# Patient Record
Sex: Male | Born: 1960 | Race: Black or African American | Hispanic: No | Marital: Married | Smoking: Former smoker
Health system: Southern US, Community
[De-identification: ages and names within clinical notes are randomized; demographics above are authoritative.]

## PROBLEM LIST (undated history)

## (undated) ENCOUNTER — Ambulatory Visit: Admission: EM

## (undated) DIAGNOSIS — Z5189 Encounter for other specified aftercare: Secondary | ICD-10-CM

## (undated) DIAGNOSIS — I10 Essential (primary) hypertension: Secondary | ICD-10-CM

## (undated) DIAGNOSIS — Z720 Tobacco use: Secondary | ICD-10-CM

## (undated) DIAGNOSIS — E785 Hyperlipidemia, unspecified: Secondary | ICD-10-CM

## (undated) DIAGNOSIS — I219 Acute myocardial infarction, unspecified: Secondary | ICD-10-CM

## (undated) DIAGNOSIS — R578 Other shock: Secondary | ICD-10-CM

## (undated) DIAGNOSIS — K279 Peptic ulcer, site unspecified, unspecified as acute or chronic, without hemorrhage or perforation: Secondary | ICD-10-CM

## (undated) HISTORY — DX: Hyperlipidemia, unspecified: E78.5

## (undated) HISTORY — DX: Tobacco use: Z72.0

## (undated) HISTORY — PX: UPPER GI ENDOSCOPY: SHX6162

## (undated) HISTORY — DX: Other shock: R57.8

---

## 2002-09-03 ENCOUNTER — Inpatient Hospital Stay (HOSPITAL_COMMUNITY): Admission: EM | Admit: 2002-09-03 | Discharge: 2002-09-05 | Payer: Self-pay | Admitting: Emergency Medicine

## 2012-04-06 ENCOUNTER — Encounter (HOSPITAL_COMMUNITY): Payer: Self-pay | Admitting: Emergency Medicine

## 2012-04-06 ENCOUNTER — Emergency Department (HOSPITAL_COMMUNITY): Payer: Self-pay

## 2012-04-06 ENCOUNTER — Encounter (HOSPITAL_COMMUNITY): Payer: Self-pay | Admitting: *Deleted

## 2012-04-06 ENCOUNTER — Emergency Department (HOSPITAL_COMMUNITY)
Admission: EM | Admit: 2012-04-06 | Discharge: 2012-04-06 | Disposition: A | Discharge status: Home / Self Care | Payer: Self-pay | Attending: Emergency Medicine | Admitting: Emergency Medicine

## 2012-04-06 DIAGNOSIS — Z8711 Personal history of peptic ulcer disease: Secondary | ICD-10-CM | POA: Insufficient documentation

## 2012-04-06 DIAGNOSIS — Z79899 Other long term (current) drug therapy: Secondary | ICD-10-CM | POA: Insufficient documentation

## 2012-04-06 DIAGNOSIS — Y9389 Activity, other specified: Secondary | ICD-10-CM | POA: Insufficient documentation

## 2012-04-06 DIAGNOSIS — Y9289 Other specified places as the place of occurrence of the external cause: Secondary | ICD-10-CM | POA: Insufficient documentation

## 2012-04-06 DIAGNOSIS — R58 Hemorrhage, not elsewhere classified: Secondary | ICD-10-CM

## 2012-04-06 DIAGNOSIS — Z8719 Personal history of other diseases of the digestive system: Secondary | ICD-10-CM | POA: Insufficient documentation

## 2012-04-06 DIAGNOSIS — I10 Essential (primary) hypertension: Secondary | ICD-10-CM | POA: Insufficient documentation

## 2012-04-06 DIAGNOSIS — IMO0002 Reserved for concepts with insufficient information to code with codable children: Secondary | ICD-10-CM

## 2012-04-06 DIAGNOSIS — X58XXXA Exposure to other specified factors, initial encounter: Secondary | ICD-10-CM | POA: Insufficient documentation

## 2012-04-06 DIAGNOSIS — F17200 Nicotine dependence, unspecified, uncomplicated: Secondary | ICD-10-CM | POA: Insufficient documentation

## 2012-04-06 DIAGNOSIS — S61209A Unspecified open wound of unspecified finger without damage to nail, initial encounter: Secondary | ICD-10-CM | POA: Insufficient documentation

## 2012-04-06 DIAGNOSIS — Y838 Other surgical procedures as the cause of abnormal reaction of the patient, or of later complication, without mention of misadventure at the time of the procedure: Secondary | ICD-10-CM | POA: Insufficient documentation

## 2012-04-06 HISTORY — DX: Essential (primary) hypertension: I10

## 2012-04-06 HISTORY — DX: Peptic ulcer, site unspecified, unspecified as acute or chronic, without hemorrhage or perforation: K27.9

## 2012-04-06 MED ORDER — CEPHALEXIN 250 MG PO CAPS
500.0000 mg | ORAL_CAPSULE | Freq: Once | ORAL | Status: AC
  Administered 2012-04-06: 500 mg via ORAL
  Filled 2012-04-06: qty 1

## 2012-04-06 MED ORDER — CEPHALEXIN 500 MG PO CAPS: 500.0000 mg | ORAL_CAPSULE | Freq: Two times a day (BID) | ORAL | Status: DC

## 2012-04-06 NOTE — Progress Notes (Signed)
Orthopedic Tech Progress Note Patient Details:  Antonio Ramos [birth year] [id number]  Ortho Devices Type of Ortho Device: Finger splint Ortho Device/Splint Location: right hand Ortho Device/Splint Interventions: Application   Devinne Epstein 04/06/2012, 6:30 PM

## 2012-04-06 NOTE — ED Provider Notes (Signed)
History     CSN: [id number]  Arrival date & time 04/06/12  0016   First MD Initiated Contact with Patient 04/06/12 0249      Chief Complaint  Patient presents with  . Laceration    (Consider location/radiation/quality/duration/timing/severity/associated sxs/prior treatment) HPI  Antonio Ramos is a 52 y.o. male complaining of complaining of laceration to right first digit approximately 9 hours ago while patient was repairing a transmission. Pain is minimal, tetanus is updated. Patient denies any decreased range of motion or change in sensation.  Past Medical History  Diagnosis Date  . Hypertension   . PUD (peptic ulcer disease)     History reviewed. No pertinent past surgical history.  No family history on file.  History  Substance Use Topics  . Smoking status: Current Some Day Smoker  . Smokeless tobacco: Not on file  . Alcohol Use: Yes      Review of Systems  Constitutional: Negative for fever.  Respiratory: Negative for shortness of breath.   Cardiovascular: Negative for chest pain.  Gastrointestinal: Negative for nausea, vomiting, abdominal pain and diarrhea.  Skin: Positive for wound.  All other systems reviewed and are negative.    Allergies  Review of patient's allergies indicates not on file.  Home Medications  No current outpatient prescriptions on file.  BP 151/126  Pulse 99  Temp 98.1 F (36.7 C) (Oral)  Resp 18  SpO2 99%  Physical Exam  Nursing note and vitals reviewed. Constitutional: He is oriented to person, place, and time. He appears well-developed and well-nourished. No distress.  HENT:  Head: Normocephalic.  Eyes: Conjunctivae normal and EOM are normal.  Cardiovascular: Normal rate.   Pulmonary/Chest: Effort normal. No stridor.  Musculoskeletal: Normal range of motion.  Neurological: He is alert and oriented to person, place, and time.  Skin:       3cm full-thickness laceration to ulnar side of right second digit on the volar  aspect of the middle and distal phalanx. Distal sensation is grossly intact.  Psychiatric: He has a normal mood and affect.    ED Course  Procedures (including critical care time)  LACERATION REPAIR Performed by: Wynetta Emery Authorized by: Wynetta Emery Consent: Verbal consent obtained. Risks and benefits: risks, benefits and alternatives were discussed Consent given by: patient Patient identity confirmed: Wrist band  Prepped and Draped in normal sterile fashion  Tetanus:  up-to-date   Laceration Location:  right second digit, volar side of the middle and distal phalanx   Laceration Length:  free cm  Anesthesia: block   Local anesthetic:2% with  epinephrine  Anesthetic total: 4 ml  Irrigation method: syringe  Amount of cleaning: copious   Wound explored to depth in good light on a bloodless field with no foreign bodies seen or palpated.   Skin closure:  4-0 polypropylene   Number of sutures: 4  Technique:  running locking   Patient tolerance: Patient tolerated the procedure well with no immediate complications.  Antibx ointment applied. Instructions for care discussed verbally and patient provided with additional written instructions for homecare and f/u.  Labs Reviewed - No data to display Dg Finger Index Right  04/06/2012  *RADIOLOGY REPORT*  Clinical Data: Injured right index finger on alternator in car. Laceration at the middle phalanx.  RIGHT INDEX FINGER 2+V  Comparison: None.  Findings: There is no evidence of osseous disruption.  Soft tissue swelling is noted about the mid second digit.  The known soft tissue laceration is difficult to fully characterize.  A tiny 2 mm piece of metal appears to be embedded within the superficial dorsal soft tissues at the proximal third digit.  There is also a 2 mm piece of metal embedded within the soft tissues of the thumb, just distal to the distal tuft.  These may reflect remote injury.  No additional radiopaque  foreign bodies are seen. Visualized joint spaces are preserved.  IMPRESSION:  1.  No evidence of osseous disruption. 2.  No radiopaque foreign bodies seen with respect to the second digit. 3.  2 mm piece of metal embedded within the superficial dorsal soft tissues at the proximal third digit.  Additional 2 mm piece of metal noted within the soft tissues of the thumb, just distal to the distal tuft.  These may reflect remote injury.   Original Report Authenticated By: Tonia Ghent, M.D.      1. Laceration       MDM  This is a shared visit with attending Dr. Bebe Shaggy.  Wound closed loosely with 4 running locking sutures.  Patient has verbalized his understanding that he must follow with the hand surgeon tomorrow or the next day for further evaluation on decreased range of motion of DIP.      Wynetta Emery, PA-C 04/06/12 [phone]

## 2012-04-06 NOTE — ED Notes (Signed)
Antibiotic ointment applied with instructions for home care.

## 2012-04-06 NOTE — Progress Notes (Signed)
Orthopedic Tech Progress Note Patient Details:  Antonio Ramos 1960-05-11 [id number]  Ortho Devices Type of Ortho Device: Finger splint Ortho Device/Splint Location: RIGHT FINGER SPLINT Ortho Device/Splint Interventions: Application   [name] 04/06/2012, 4:04 AM

## 2012-04-06 NOTE — ED Notes (Signed)
PT. PRESENTS WITH LACERATION APPROX. 1 INCH LONG AT RIGHT DISTAL INDEX FINGER SUSTAINED YESTERDAY AFTERNOON WHILE WORKING ON A TRANSMISSION , DRESSING APPLIED AT TRIAGE, TETANUS IMMUNIZATION 2 YEARS AGO.

## 2012-04-06 NOTE — ED Notes (Signed)
Ortho paged. 

## 2012-04-06 NOTE — ED Provider Notes (Signed)
History  Scribed for Remi Haggard, FNP, Rolan Bucco, MD, the patient was seen in room TR07C/TR07C. This chart was scribed by Candelaria Stagers. The patient's care started at 5:18 PM   CSN: [id number]  Arrival date & time 04/06/12  1531   None     Chief Complaint  Patient presents with  . Finger Injury     The history is provided by the patient. No language interpreter was used.   Antonio Ramos is a 52 y.o. male who presents to the Emergency Department for recheck of finger injury that was sutured last night in the ED about five hours after cutting his finger.  He reports that the stitches came out today and he is currently experiencing bleeding around the stitches.  He denies hitting the finger and had a splint on the finger.  He denies pain.  Past Medical History  Diagnosis Date  . Hypertension   . PUD (peptic ulcer disease)     History reviewed. No pertinent past surgical history.  No family history on file.  History  Substance Use Topics  . Smoking status: Current Some Day Smoker  . Smokeless tobacco: Not on file  . Alcohol Use: Yes      Review of Systems  Constitutional: Negative for fever and chills.  Gastrointestinal: Negative for nausea and vomiting.  Skin: Positive for wound (leceration to left first finger with sutures that have come out, bleeding).  All other systems reviewed and are negative.    Allergies  Review of patient's allergies indicates no known allergies.  Home Medications   Current Outpatient Rx  Name  Route  Sig  Dispense  Refill  . OMEPRAZOLE 20 MG PO CPDR   Oral   Take 20 mg by mouth daily.         . CEPHALEXIN 500 MG PO CAPS   Oral   Take 1 capsule (500 mg total) by mouth 2 (two) times daily.   20 capsule   0     BP 147/100  Pulse 111  Temp 98.2 F (36.8 C) (Oral)  Resp 20  SpO2 98%  Physical Exam  Nursing note and vitals reviewed. Constitutional: He is oriented to person, place, and time. He appears well-developed and  well-nourished. No distress.  HENT:  Head: Normocephalic and atraumatic.  Eyes: EOM are normal.  Neck: Neck supple. No tracheal deviation present.  Cardiovascular: Normal rate.   Pulmonary/Chest: Effort normal. No respiratory distress.  Musculoskeletal: Normal range of motion.  Neurological: He is alert and oriented to person, place, and time.  Skin: Skin is warm and dry.       Sutures to L index finger intact / loose because he came in 12-16 hour after the injury occurred yesterday.   Bleeding.  Psychiatric: He has a normal mood and affect. His behavior is normal.    ED Course  Procedures   DIAGNOSTIC STUDIES: Oxygen Saturation is 98% on room air, normal by my interpretation.    COORDINATION OF CARE:  5:54 PM Dr. Fredderick Phenix evaluated pt.  Will apply pressure and watch for bleeding.  Will apply splint.  Pt understands and agrees.   6:28 PM Recheck: Bleeding stopped.  Splint applied.  Advised pt to elevate hand and follow up with hand specialist.    Labs Reviewed - No data to display Dg Finger Index Right  04/06/2012  *RADIOLOGY REPORT*  Clinical Data: Injured right index finger on alternator in car. Laceration at the middle phalanx.  RIGHT INDEX FINGER 2+V  Comparison: None.  Findings: There is no evidence of osseous disruption.  Soft tissue swelling is noted about the mid second digit.  The known soft tissue laceration is difficult to fully characterize.  A tiny 2 mm piece of metal appears to be embedded within the superficial dorsal soft tissues at the proximal third digit.  There is also a 2 mm piece of metal embedded within the soft tissues of the thumb, just distal to the distal tuft.  These may reflect remote injury.  No additional radiopaque foreign bodies are seen. Visualized joint spaces are preserved.  IMPRESSION:  1.  No evidence of osseous disruption. 2.  No radiopaque foreign bodies seen with respect to the second digit. 3.  2 mm piece of metal embedded within the superficial dorsal  soft tissues at the proximal third digit.  Additional 2 mm piece of metal noted within the soft tissues of the thumb, just distal to the distal tuft.  These may reflect remote injury.   Original Report Authenticated By: Tonia Ghent, M.D.      No diagnosis found.    MDM  Laceration to L index finger repaired with loose sutures yesterday in the ER.  Clotting material and pressure dressing used tonight for bleeding with good results.  He will continue antibiotics and follow up with hand tomorrow.  Finger splint provided.  He will follow up with pcp of choice for hypertension.  Shared visit with Dr. Fredderick Phenix.  I personally performed the services described in this documentation, which was scribed in my presence. The recorded information has been reviewed and is accurate.      Remi Haggard, NP 04/07/12 1234  Remi Haggard, NP 04/07/12 1234

## 2012-04-06 NOTE — ED Provider Notes (Signed)
Medical screening examination/treatment/procedure(s) were conducted as a shared visit with non-physician practitioner(s) and myself.  I personally evaluated the patient during the encounter   Wound noted.  Advised loose closure and f/u with Hand surgery  Joya Gaskins, MD 04/06/12 [phone]

## 2012-04-06 NOTE — ED Notes (Signed)
Returned to ED for eval of finger injury. Pt was in ED last night for sutures. States today a cple of sutures 'busted'.

## 2012-04-07 NOTE — ED Provider Notes (Signed)
Medical screening examination/treatment/procedure(s) were conducted as a shared visit with non-physician practitioner(s) and myself.  I personally evaluated the patient during the encounter   Rolan Bucco, MD 04/07/12 1458

## 2012-10-20 ENCOUNTER — Encounter (HOSPITAL_COMMUNITY): Payer: Self-pay | Admitting: Cardiology

## 2012-10-20 ENCOUNTER — Emergency Department (HOSPITAL_COMMUNITY)
Admission: EM | Admit: 2012-10-20 | Discharge: 2012-10-20 | Discharge status: Against Medical Advice | Payer: Self-pay | Attending: Emergency Medicine | Admitting: Emergency Medicine

## 2012-10-20 DIAGNOSIS — R5383 Other fatigue: Secondary | ICD-10-CM | POA: Insufficient documentation

## 2012-10-20 DIAGNOSIS — R5381 Other malaise: Secondary | ICD-10-CM | POA: Insufficient documentation

## 2012-10-20 DIAGNOSIS — K625 Hemorrhage of anus and rectum: Secondary | ICD-10-CM | POA: Insufficient documentation

## 2012-10-20 LAB — CBC WITH DIFFERENTIAL/PLATELET
Eosinophils Absolute: 0.1 10*3/uL (ref 0.0–0.7)
Eosinophils Relative: 1 % (ref 0–5)
HCT: 25.4 % — ABNORMAL LOW (ref 39.0–52.0)
Lymphocytes Relative: 22 % (ref 12–46)
Lymphs Abs: 2 10*3/uL (ref 0.7–4.0)
MCH: 27.9 pg (ref 26.0–34.0)
MCV: 81.4 fL (ref 78.0–100.0)
Monocytes Absolute: 0.4 10*3/uL (ref 0.1–1.0)
Platelets: 169 10*3/uL (ref 150–400)
RBC: 3.12 MIL/uL — ABNORMAL LOW (ref 4.22–5.81)
WBC: 9.4 10*3/uL (ref 4.0–10.5)

## 2012-10-20 LAB — COMPREHENSIVE METABOLIC PANEL
BUN: 37 mg/dL — ABNORMAL HIGH (ref 6–23)
CO2: 24 mEq/L (ref 19–32)
Calcium: 8.9 mg/dL (ref 8.4–10.5)
Creatinine, Ser: 0.98 mg/dL (ref 0.50–1.35)
GFR calc Af Amer: >90 mL/min (ref >90)
GFR calc non Af Amer: >90 mL/min (ref >90)
Glucose, Bld: 113 mg/dL — ABNORMAL HIGH (ref 70–99)
Sodium: 139 mEq/L (ref 135–145)
Total Protein: 5.8 g/dL — ABNORMAL LOW (ref 6.0–8.3)

## 2012-10-20 LAB — LIPASE, BLOOD: Lipase: 22 U/L (ref 11–59)

## 2012-10-20 NOTE — ED Notes (Signed)
Pt reports hx of ulcers in his stomach that he had to have surgery on about 2 years ago. Reports this morning he noticed some bright red blood in his stool and has felt weak. Reports that he felt like he may have had a fever over the past day and has had chills. Denies any chest pain or SOB. No focal weakness or neuro deficits.

## 2012-10-20 NOTE — ED Notes (Signed)
Informed pt that he would be moving to a room shortly. States that he does not want to stay and is leaving against medical advice.

## 2012-11-21 ENCOUNTER — Inpatient Hospital Stay (HOSPITAL_COMMUNITY)
Admission: EM | Admit: 2012-11-21 | Discharge: 2012-11-22 | DRG: 175 | Discharge status: Against Medical Advice | Payer: BC Managed Care – PPO | Attending: Internal Medicine | Admitting: Internal Medicine

## 2012-11-21 ENCOUNTER — Encounter (HOSPITAL_COMMUNITY): Payer: Self-pay | Admitting: Emergency Medicine

## 2012-11-21 DIAGNOSIS — I10 Essential (primary) hypertension: Secondary | ICD-10-CM | POA: Insufficient documentation

## 2012-11-21 DIAGNOSIS — Z8249 Family history of ischemic heart disease and other diseases of the circulatory system: Secondary | ICD-10-CM

## 2012-11-21 DIAGNOSIS — D649 Anemia, unspecified: Secondary | ICD-10-CM

## 2012-11-21 DIAGNOSIS — K279 Peptic ulcer, site unspecified, unspecified as acute or chronic, without hemorrhage or perforation: Secondary | ICD-10-CM

## 2012-11-21 DIAGNOSIS — Z8711 Personal history of peptic ulcer disease: Secondary | ICD-10-CM

## 2012-11-21 DIAGNOSIS — R9431 Abnormal electrocardiogram [ECG] [EKG]: Secondary | ICD-10-CM | POA: Diagnosis present

## 2012-11-21 DIAGNOSIS — K922 Gastrointestinal hemorrhage, unspecified: Principal | ICD-10-CM | POA: Diagnosis present

## 2012-11-21 DIAGNOSIS — F17200 Nicotine dependence, unspecified, uncomplicated: Secondary | ICD-10-CM | POA: Diagnosis present

## 2012-11-21 DIAGNOSIS — I252 Old myocardial infarction: Secondary | ICD-10-CM

## 2012-11-21 LAB — COMPREHENSIVE METABOLIC PANEL
ALT: 7 U/L (ref 0–53)
Albumin: 2.9 g/dL — ABNORMAL LOW (ref 3.5–5.2)
Alkaline Phosphatase: 46 U/L (ref 39–117)
Chloride: 109 mEq/L (ref 96–112)
GFR calc Af Amer: >90 mL/min (ref >90)
Glucose, Bld: 105 mg/dL — ABNORMAL HIGH (ref 70–99)
Potassium: 4 mEq/L (ref 3.5–5.1)
Sodium: 139 mEq/L (ref 135–145)
Total Bilirubin: 0.2 mg/dL — ABNORMAL LOW (ref 0.3–1.2)
Total Protein: 5.3 g/dL — ABNORMAL LOW (ref 6.0–8.3)

## 2012-11-21 LAB — ABO/RH: ABO/RH(D): O POS

## 2012-11-21 LAB — CBC WITH DIFFERENTIAL/PLATELET
Eosinophils Absolute: 0.1 10*3/uL (ref 0.0–0.7)
Hemoglobin: 6.5 g/dL — CL (ref 13.0–17.0)
Lymphs Abs: 1.6 10*3/uL (ref 0.7–4.0)
MCH: 25.6 pg — ABNORMAL LOW (ref 26.0–34.0)
Monocytes Relative: 6 % (ref 3–12)
Neutro Abs: 6.8 10*3/uL (ref 1.7–7.7)
Neutrophils Relative %: 75 % (ref 43–77)
Platelets: 204 10*3/uL (ref 150–400)
RBC: 2.54 MIL/uL — ABNORMAL LOW (ref 4.22–5.81)
WBC: 9.2 10*3/uL (ref 4.0–10.5)

## 2012-11-21 LAB — CBC
HCT: 21.3 % — ABNORMAL LOW (ref 39.0–52.0)
Hemoglobin: 7.2 g/dL — ABNORMAL LOW (ref 13.0–17.0)
MCHC: 33.8 g/dL (ref 30.0–36.0)
MCV: 80.4 fL (ref 78.0–100.0)
RDW: 16.3 % — ABNORMAL HIGH (ref 11.5–15.5)

## 2012-11-21 MED ORDER — ONDANSETRON HCL 4 MG PO TABS: 4.0000 mg | ORAL_TABLET | Freq: Four times a day (QID) | ORAL | Status: DC | PRN

## 2012-11-21 MED ORDER — SODIUM CHLORIDE 0.9 % IV SOLN
80.0000 mg | INTRAVENOUS | Status: AC
  Administered 2012-11-21: 80 mg via INTRAVENOUS
  Filled 2012-11-21: qty 80

## 2012-11-21 MED ORDER — SODIUM CHLORIDE 0.9 % IV BOLUS (SEPSIS)
1000.0000 mL | Freq: Once | INTRAVENOUS | Status: AC
  Administered 2012-11-21: 1000 mL via INTRAVENOUS

## 2012-11-21 MED ORDER — SODIUM CHLORIDE 0.9 % IV SOLN
8.0000 mg/h | INTRAVENOUS | Status: DC
  Administered 2012-11-21 – 2012-11-22 (×2): 8 mg/h via INTRAVENOUS
  Filled 2012-11-21 (×7): qty 80

## 2012-11-21 MED ORDER — ACETAMINOPHEN 650 MG RE SUPP: 650.0000 mg | Freq: Four times a day (QID) | RECTAL | Status: DC | PRN

## 2012-11-21 MED ORDER — ONDANSETRON HCL 4 MG/2ML IJ SOLN: 4.0000 mg | Freq: Four times a day (QID) | INTRAMUSCULAR | Status: DC | PRN

## 2012-11-21 MED ORDER — SODIUM CHLORIDE 0.9 % IV SOLN: INTRAVENOUS | Status: DC

## 2012-11-21 MED ORDER — ACETAMINOPHEN 325 MG PO TABS: 650.0000 mg | ORAL_TABLET | Freq: Four times a day (QID) | ORAL | Status: DC | PRN

## 2012-11-21 MED ORDER — MORPHINE SULFATE 2 MG/ML IJ SOLN: 2.0000 mg | INTRAMUSCULAR | Status: DC | PRN

## 2012-11-21 MED ORDER — PANTOPRAZOLE SODIUM 40 MG IV SOLR: 40.0000 mg | Freq: Two times a day (BID) | INTRAVENOUS | Status: DC

## 2012-11-21 NOTE — Progress Notes (Signed)
Pt tolerated second unit of blood without any signs of complicaitons or reaction; pt stable at this time; awaiting blood recheck

## 2012-11-21 NOTE — ED Notes (Signed)
Pt ambulated to BR for BM-- while on commode became diaphoretic, dizzy, weak-- assisted per 3 staff to w/c-- lifted into bed-- BP 117/67 HR- 77- R- 16

## 2012-11-21 NOTE — Consult Note (Signed)
[facility] Reason for consult: G.I. bleeding Referring Physician: ER. No primary care physician.  Antonio Ramos is an 52 y.o. male.  HPI: patient had a history of ulcers in 1986. He reports that he has had no problems with ulcers since that time unless he eats spicy foods. He had melenic stools about a month ago and presented to the emergency room of left AMA. The patient vomited some bright red blood yesterday and had some melenic stools. He adamantly denies use of any NSAIDs. He had an EGD in 1986 and swears he will never have another one.  Past Medical History  Diagnosis Date  . Hypertension   . PUD (peptic ulcer disease)     Diagnosed in 1986, with 3 prior bleeds since that time, most recently in 2004    Past Surgical History  Procedure Laterality Date  . Upper gi endoscopy      History reviewed. No pertinent family history.  Social History:  reports that he has been smoking Cigarettes.  He has a 24 pack-year smoking history. He does not have any smokeless tobacco history on file. He reports that he drinks about 0.5 ounces of alcohol per week. He reports that he does not use illicit drugs.  Allergies: No Known Allergies  Medications;   PRN Meds  Results for orders placed during the hospital encounter of 11/21/12 (from the past 48 hour(s))  CBC WITH DIFFERENTIAL     Status: Abnormal   Collection Time    11/21/12  9:05 AM      Result Value Range   WBC 9.2  4.0 - 10.5 K/uL   RBC 2.54 (*) 4.22 - 5.81 MIL/uL   Hemoglobin 6.5 (*) 13.0 - 17.0 g/dL   Comment: REPEATED TO VERIFY     CRITICAL RESULT CALLED TO, READ BACK BY AND VERIFIED WITH:     KAREN COBB,RN AT 0944 11/21/12 BY ZPERRY.   HCT 19.3 (*) 39.0 - 52.0 %   MCV 76.0 (*) 78.0 - 100.0 fL   MCH 25.6 (*) 26.0 - 34.0 pg   MCHC 33.7  30.0 - 36.0 g/dL   RDW 47.8 (*) 29.5 - 62.1 %   Platelets 204  150 - 400 K/uL   Neutrophils Relative % 75  43 - 77 %   Neutro Abs 6.8  1.7 - 7.7 K/uL   Lymphocytes Relative  17  12 - 46 %   Lymphs Abs 1.6  0.7 - 4.0 K/uL   Monocytes Relative 6  3 - 12 %   Monocytes Absolute 0.6  0.1 - 1.0 K/uL   Eosinophils Relative 2  0 - 5 %   Eosinophils Absolute 0.1  0.0 - 0.7 K/uL   Basophils Relative 1  0 - 1 %   Basophils Absolute 0.1  0.0 - 0.1 K/uL  COMPREHENSIVE METABOLIC PANEL     Status: Abnormal   Collection Time    11/21/12  9:05 AM      Result Value Range   Sodium 139  135 - 145 mEq/L   Potassium 4.0  3.5 - 5.1 mEq/L   Chloride 109  96 - 112 mEq/L   CO2 25  19 - 32 mEq/L   Glucose, Bld 105 (*) 70 - 99 mg/dL   BUN 28 (*) 6 - 23 mg/dL   Creatinine, Ser 3.08  0.50 - 1.35 mg/dL   Calcium 8.2 (*) 8.4 - 10.5 mg/dL   Total Protein 5.3 (*) 6.0 - 8.3 g/dL   Albumin 2.9 (*)  3.5 - 5.2 g/dL   AST 11  0 - 37 U/L   ALT 7  0 - 53 U/L   Alkaline Phosphatase 46  39 - 117 U/L   Total Bilirubin 0.2 (*) 0.3 - 1.2 mg/dL   GFR calc non Af Amer 83 (*) >90 mL/min   GFR calc Af Amer >90  >90 mL/min   Comment: (NOTE)     The eGFR has been calculated using the CKD EPI equation.     This calculation has not been validated in all clinical situations.     eGFR's persistently <90 mL/min signify possible Chronic Kidney     Disease.  PROTIME-INR     Status: Abnormal   Collection Time    11/21/12  9:05 AM      Result Value Range   Prothrombin Time 16.7 (*) 11.6 - 15.2 seconds   INR 1.39  0.00 - 1.49  TYPE AND SCREEN     Status: None   Collection Time    11/21/12  9:05 AM      Result Value Range   ABO/RH(D) O POS     Antibody Screen NEG     Sample Expiration 11/24/2012     Unit Number [id number]     Blood Component Type RED CELLS,LR     Unit division 00     Status of Unit ALLOCATED     Transfusion Status OK TO TRANSFUSE     Crossmatch Result Compatible     Unit Number [id number]     Blood Component Type RED CELLS,LR     Unit division 00     Status of Unit ISSUED     Transfusion Status OK TO TRANSFUSE     Crossmatch Result Compatible    ABO/RH     Status: None    Collection Time    11/21/12  9:05 AM      Result Value Range   ABO/RH(D) O POS    PREPARE RBC (CROSSMATCH)     Status: None   Collection Time    11/21/12  9:05 AM      Result Value Range   Order Confirmation ORDER PROCESSED BY BLOOD BANK      No results found.             Blood pressure 123/69, pulse 68, temperature 97.6 F (36.4 C), temperature source Oral, resp. rate 10, height 6\' 2"  (1.88 m), weight 83.462 kg (184 lb), SpO2 100.00%.  Physical exam:   General-- talkative African-American male in no acute distress Heart-- regular rate and rhythm without murmurs are gallops Lungs--clear Abdomen-- completely nontender   Assessment: 1. Probable upper G.I. bleeding. Suspect patient has bleeding ulcer. Hemoglobin is 6.5, history of hematemesis and elevated BUN.  Plan: 1. Suggested EGD this afternoon. Patient adamantly refuses EGD. He was told that he could bleed to death and that an EGD could stop bleeding. He states that if he was dying, he may reconsider. 2. Would continue him NPO for the next 24 hours. He may consider EGD if he begins to hemorrhage if he becomes stable, would consider upper G.I. in the next several days.   Rhya Shan JR,Shirl Weir L 11/21/2012, 12:04 PM

## 2012-11-21 NOTE — H&P (Signed)
Date: 11/21/2012               Patient Name:  Antonio Ramos MRN: [id number]  DOB: 1961-03-07 Age / Sex: 52 y.o., male   PCP: Provider Default, MD         Medical Service: Internal Medicine Teaching Service         Attending Physician: Dr. Kem Kays    First Contact: Dr. Glendell Docker Pager: [phone]  Second Contact: Dr. Dierdre Searles Pager: [phone]       After Hours (After 5p/  First Contact Pager: [phone]  weekends / holidays): Second Contact Pager: [phone]   Chief Complaint: GI bleed  History of Present Illness:  The patient is a 52 yo man, history of PUD, HTN, presenting with blood per rectum.  The patient was in his usual state of health until 4pm on the day prior to admission, when he developed nausea.  Around 8pm, he experienced 1 episode of vomiting, with bright red blood.  This morning, the patient noted 1 episode of vomiting with both bright and dark red blood, as well as 2 painless bowel movements with mixed dark and bright red blood.  The patient notes associated lightheadedness with BP this morning.  He notes no abdominal pain, chest pain, rectal pain.  He notes eating a spicy meal 3 days ago, drinks 1 beer/week (last drink 3 days ago), but notes no NSAID or steroid usage.  The patient takes maalox daily, but does not take a daily PPI/H2 blocker per pt report.  The patient does not have a PCP.  Meds: Maalox daily   Allergies: Allergies as of 11/21/2012  . (No Known Allergies)   Past Medical History  Diagnosis Date  . Hypertension   . PUD (peptic ulcer disease)     Diagnosed in 1986, with 3 prior bleeds since that time, most recently in 2004   Past Surgical History  Procedure Laterality Date  . Upper gi endoscopy     History reviewed. No pertinent family history. History   Social History  . Marital Status: Married    Spouse Name: N/A    Number of Children: N/A  . Years of Education: N/A   Occupational History  . Not on file.   Social History Main Topics  . Smoking status:  Current Some Day Smoker -- 1.00 packs/day for 24 years    Types: Cigarettes  . Smokeless tobacco: Not on file  . 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 Drug Use: No  . Sexual Activity: Not on file   Other Topics Concern  . Not on file   Social History Narrative   Moved from [place] to [place] in 2009.  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    Review of Systems: General: no fevers, chills, changes in weight, changes in appetite Skin: no rash HEENT: no blurry vision, hearing changes, sore throat Pulm: no dyspnea, coughing, wheezing CV: no chest pain, palpitations, shortness of breath Abd: no abdominal pain, nausea/vomiting, diarrhea/constipation GU: no dysuria, hematuria, polyuria Ext: no arthralgias, myalgias Neuro: no weakness, numbness, or tingling  Physical Exam: Blood pressure 125/83, pulse 71, temperature 97.8 F (36.6 C), temperature source Oral, resp. rate 14, height 6\' 2"  (1.88 m), weight 184 lb (83.462 kg), SpO2 100.00%. General: alert, cooperative, and in no apparent distress HEENT: pupils equal round and reactive to light, vision grossly intact, oropharynx clear  and non-erythematous  Neck: supple, no lymphadenopathy Lungs: clear to ascultation bilaterally, normal work of respiration, no wheezes, rales, ronchi Heart: regular rate and rhythm, no murmurs, gallops, or rubs Abdomen: soft, non-tender, non-distended, normal bowel sounds Extremities: no cyanosis, clubbing, or edema Neurologic: alert & oriented X3, cranial nerves II-XII intact, strength grossly intact, sensation intact to light touch  Lab results: Basic Metabolic Panel:  Recent Labs  16/10/96 0905  NA 139  K 4.0  CL 109  CO2 25  GLUCOSE 105*  BUN 28*  CREATININE 1.00  CALCIUM 8.2*   Liver Function Tests:  Recent Labs  11/21/12 0905  AST 11  ALT 7  ALKPHOS 46  BILITOT 0.2*  PROT 5.3*  ALBUMIN 2.9*   CBC:  Recent  Labs  11/21/12 0905  WBC 9.2  NEUTROABS 6.8  HGB 6.5*  HCT 19.3*  MCV 76.0*  PLT 204   Coagulation:  Recent Labs  11/21/12 0905  LABPROT 16.7*  INR 1.39    Other results: EKG: NSR, TWI in II, III, aVF, and in V4-V6, I, suggestive of possible prior inferolateral MI.  No old EKG for comparison  Assessment & Plan by Problem: The patient is a 52 yo man, history of PUD, presenting with an acute GI bleed  # Acute GI bleed - The patient presents with bloody vomitus, as well as both bright and dark red painless blood per rectum, with Hb = 6.5 (previously 8.7 on 10/20/12).  Differential includes bleeding PUD (history of same, though no significant recent risk factors for GI bleed. Uncertain H pylori status).  Less likely diverticulosis (given dark blood per rectum). -protonix bolus and drip -1 L NS bolus, followed by 125 cc/hr -transfused 2 units pRBC's by ED -NPO -GI consulted, for consideration of EGD -cbc q6hrs  # EKG abnormality - TWI in inferolateral leads.  Pt with no chest pain.  He states he had a "mini" heart attack 10 years ago, which could explain these findings.  Since we have no old EKG for comparison, will cycle trop x3. -cycle trop x3  # HTN - pt reports history of HTN, but not on any home BP medications, and BP borderline low-normal this admission (in setting of acute GI bleed) -monitor for now.  Will not start treatment now, due to acute GI bleed and concern for hypoTN  Dispo: Disposition is deferred at this time, awaiting improvement of current medical problems. Anticipated discharge in approximately 2-3 day(s).   The patient does not have a current PCP (Provider Default, MD) and does need an [facility] hospital follow-up appointment after discharge.  Signed: Linward Headland, MD 11/21/2012, 11:38 AM

## 2012-11-21 NOTE — ED Notes (Signed)
Patients blood has been retrieved.

## 2012-11-21 NOTE — Consult Note (Signed)
CARDIOLOGY CONSULT NOTE  Patient ID: Antonio Ramos MRN: [id number] DOB/AGE: 05-15-1960 52 y.o.  Admit date: 11/21/2012 Referring Physician  Dr. Glendell Docker Primary Physician:  Default, Provider, MD Reason for Consultation  Abnormal EKG  HPI: Patient is a 52 African American male with history of hypertension but not on any medical therapy, history of peptic ulcer disease, who was recently evaluated in the emergency room about a week ago when he had noticed dark stools, but he did not wait in the ED and went home. He took Mylanta and felt better. However he started to notice abdominal discomfort and bloody stools 2 days ago and eventually presented to the emergency room with marked generalized fatigue. He was found to have active GI bleeding needing a transfusion. During evaluation, he was also found to have marked abnormal EKG hence I was requested to see the patient. Patient states that many years ago about 10, was told that he has had heart attack, he was lifting heavy objects, but since then states that he has been doing well. There is no history to suggest any cardiac workup including coronary angiography. He states that he does have hypertension but has not been on medications for many years. He denies any chest pain, shortness breath, PND or orthopnea. Used to smoke about 2 packs this aggressive but for the last year to 2 years, he has reduced it to half a pack of cigarettes a day. He denies any use of illicit drug.  Past Medical History  Diagnosis Date  . Hypertension   . PUD (peptic ulcer disease)     Diagnosed in 1986, with 3 prior bleeds since that time, most recently in 2004     Past Surgical History  Procedure Laterality Date  . Upper gi endoscopy       Family History  Problem Relation Age of Onset  . Heart attack Father     at age of 77     Social History: History   Social History  . Marital Status: Married    Spouse Name: N/A    Number of Children: N/A  . Years of  Education: N/A   Occupational History  . Not on file.   Social History Main Topics  . Smoking status: Current Some Day Smoker -- 1.00 packs/day for 24 years    Types: Cigarettes  . Smokeless tobacco: Not on file  . 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 Drug Use: No  . Sexual Activity: Not on file   Other Topics Concern  . Not on file   Social History Narrative   Moved from [place] to [place] in 2009.  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     No prescriptions prior to admission    Scheduled Meds: . [START ON 11/24/2012] pantoprazole (PROTONIX) IV  40 mg Intravenous Q12H   Continuous Infusions: . sodium chloride 125 mL/hr at 11/21/12 1515  . pantoprozole (PROTONIX) infusion 8 mg/hr (11/21/12 1318)   PRN Meds:.acetaminophen, acetaminophen, morphine injection, ondansetron (ZOFRAN) IV, ondansetron  ROS: General: no fevers/chills/night sweats Eyes: no blurry vision, diplopia, or amaurosis ENT: no sore throat or hearing loss Resp: no cough, wheezing, or hemoptysis CV: no edema or palpitations GU: no dysuria, frequency, or hematuria Skin: no rash Neuro: no headache, numbness, tingling, or weakness of extremities Musculoskeletal: no joint pain or swelling Heme: no bleeding, DVT, or easy bruising Endo:  no polydipsia or polyuria    Physical Exam: Blood pressure 136/72, pulse 64, temperature 98.2 F (36.8 C), temperature source Oral, resp. rate 16, height 6\' 2"  (1.88 m), weight 82.5 kg (181 lb 14.1 oz), SpO2 100.00%.   General appearance: alert, cooperative, appears stated age and no distress Lungs: clear to auscultation bilaterally Chest wall: no tenderness Heart: regular rate and rhythm, S1, S2 normal, no murmur, click, rub or gallop Abdomen: Minimal epigastric tenderness present. There is no guarding or rigidity. Bowel sounds are normal quadrant spelled Extremities: extremities normal, atraumatic,  no cyanosis or edema Pulses: 2+ and symmetric Neurologic: Grossly normal  Labs:   Lab Results  Component Value Date   WBC 9.2 11/21/2012   HGB 6.5* 11/21/2012   HCT 19.3* 11/21/2012   MCV 76.0* 11/21/2012   PLT 204 11/21/2012    Recent Labs Lab 11/21/12 0905  NA 139  K 4.0  CL 109  CO2 25  BUN 28*  CREATININE 1.00  CALCIUM 8.2*  PROT 5.3*  BILITOT 0.2*  ALKPHOS 46  ALT 7  AST 11  GLUCOSE 105*   EKG: 11/21/2012: Sinus bradycardia at a rate of 67, marked LVH with repolarization abnormality. Cannot exclude inferior and lateral ischemia.  ASSESSMENT AND PLAN:  1. Abnormal EKG suggesting hypertensive heart disease. His blood pressure is normal controlled probably due to underlying GI bleed which is active. I doubt ha has ischemia or has known coronary artery disease, although patient states that his heart attack many years ago with no cardiac marker. 2. Tobacco use disorder 3. GI bleed due to gastric ulcer  Recommendation: I do not see that he has active medical ischemia or ongoing acute coronary syndrome. Agree with checking the enzymes, however aspirin is contraindicated at this time, he probably will need therapy for hypertension eventually. I be happy to follow him up in the outpatient basis for management of his abnormal EKG. There is no clinical evidence of congestive heart failure. Has no specific recommendation from cardiac standpoint for now. Have discussed with the patient regarding smoking cessation. He appears to be motivated.  Pamella Pert, MD 11/21/2012, 4:46 PM [facility] Pager: [phone] Office: [phone] If no answer Cell [phone]

## 2012-11-21 NOTE — ED Notes (Signed)
Episode of diaphoresis and "hot feeling" after labs drawn-- A/O x 3

## 2012-11-21 NOTE — Progress Notes (Signed)
Utilization Review Completed.Antonio Ramos T8/18/2014  

## 2012-11-21 NOTE — ED Notes (Signed)
To ED via [facility] from home with c/o GI bleeding, vomiting bright red blood last night-- dark tarry stool this am-- Hx of same in 2010

## 2012-11-21 NOTE — ED Provider Notes (Signed)
CSN: [id number]     Arrival date & time 11/21/12  0831 History     First MD Initiated Contact with Patient 11/21/12 [phone]     Chief Complaint  Patient presents with  . GI Bleeding   (Consider location/radiation/quality/duration/timing/severity/associated sxs/prior Treatment) HPI Pt with history of PUD presents with multiple episodes of painless dark bloody BM's. Pt with similar symptoms last month which resolved spontaneously. Pt has been seen by GI in the past with EGD that showed PUD. He had one episode of vomiting yesterday with very small amount of bright red blood. Pt denies abd pain, chest pain. Reports diaphoresis when changing positions quickly. No lightheadedness or dizziness.  Past Medical History  Diagnosis Date  . Hypertension   . PUD (peptic ulcer disease)     Diagnosed in 1986, with 3 prior bleeds since that time, most recently in 2004   Past Surgical History  Procedure Laterality Date  . Upper gi endoscopy     History reviewed. No pertinent family history. History  Substance Use Topics  . Smoking status: Current Some Day Smoker -- 1.00 packs/day for 24 years    Types: Cigarettes  . Smokeless tobacco: Not on file  . Alcohol Use: 0.5 oz/week    1 drink(s) per week     Comment: states used to drink heavily in 1983    Review of Systems  Constitutional: Positive for diaphoresis. Negative for fever and chills.  HENT: Negative for neck pain.   Respiratory: Negative for cough and shortness of breath.   Cardiovascular: Negative for chest pain, palpitations and leg swelling.  Gastrointestinal: Positive for diarrhea and blood in stool. Negative for nausea, vomiting, abdominal pain and constipation.  Genitourinary: Negative for frequency and hematuria.  Musculoskeletal: Negative for myalgias, back pain and gait problem.  Skin: Negative for rash and wound.  Neurological: Negative for dizziness, weakness, light-headedness, numbness and headaches.  All other systems  reviewed and are negative.    Allergies  Review of patient's allergies indicates no known allergies.  Home Medications   No current outpatient prescriptions on file. BP 114/66  Pulse 66  Temp(Src) 98 F (36.7 C) (Oral)  Resp 13  Ht 6\' 2"  (1.88 m)  Wt 184 lb (83.462 kg)  BMI 23.61 kg/m2  SpO2 100% Physical Exam  Nursing note and vitals reviewed. Constitutional: He is oriented to person, place, and time. He appears well-developed and well-nourished. No distress.  HENT:  Head: Normocephalic and atraumatic.  Mouth/Throat: Oropharynx is clear and moist.  Eyes: EOM are normal. Pupils are equal, round, and reactive to light.  Neck: Normal range of motion. Neck supple.  Cardiovascular: Normal rate and regular rhythm.   Pulmonary/Chest: Effort normal and breath sounds normal. No respiratory distress. He has no wheezes. He has no rales. He exhibits no tenderness.  Abdominal: Soft. Bowel sounds are normal. He exhibits no distension and no mass. There is no tenderness. There is no rebound and no guarding.  Musculoskeletal: Normal range of motion. He exhibits no edema and no tenderness.  Neurological: He is alert and oriented to person, place, and time.  Skin: Skin is warm and dry. No rash noted. No erythema.  Psychiatric: He has a normal mood and affect. His behavior is normal.    ED Course   Procedures (including critical care time)  Labs Reviewed  CBC WITH DIFFERENTIAL - Abnormal; Notable for the following:    RBC 2.54 (*)    Hemoglobin 6.5 (*)    HCT 19.3 (*)  MCV 76.0 (*)    MCH 25.6 (*)    RDW 15.9 (*)    All other components within normal limits  COMPREHENSIVE METABOLIC PANEL - Abnormal; Notable for the following:    Glucose, Bld 105 (*)    BUN 28 (*)    Calcium 8.2 (*)    Total Protein 5.3 (*)    Albumin 2.9 (*)    Total Bilirubin 0.2 (*)    GFR calc non Af Amer 83 (*)    All other components within normal limits  PROTIME-INR - Abnormal; Notable for the  following:    Prothrombin Time 16.7 (*)    All other components within normal limits  TROPONIN I  TYPE AND SCREEN  ABO/RH  PREPARE RBC (CROSSMATCH)   No results found. 1. Acute GI bleeding   2. Anemia      Date: 11/21/2012  Rate: 65  Rhythm: normal sinus rhythm  QRS Axis: normal  Intervals: normal  ST/T Wave abnormalities: nonspecific T wave changes  Conduction Disutrbances:none  Narrative Interpretation:   Old EKG Reviewed: none available    MDM  Discussed with Dr Jalene Mullet) who will see pt. Pt had bright bloody BM in ED and near syncopal episode. Symptoms have improved and pt remains HDS. Teaching service to admit to step down unit  Loren Racer, MD 11/21/12 1244

## 2012-11-22 DIAGNOSIS — I10 Essential (primary) hypertension: Secondary | ICD-10-CM

## 2012-11-22 DIAGNOSIS — K279 Peptic ulcer, site unspecified, unspecified as acute or chronic, without hemorrhage or perforation: Secondary | ICD-10-CM

## 2012-11-22 DIAGNOSIS — D649 Anemia, unspecified: Secondary | ICD-10-CM

## 2012-11-22 LAB — CBC
HCT: 16.7 % — ABNORMAL LOW (ref 39.0–52.0)
HCT: 19.8 % — ABNORMAL LOW (ref 39.0–52.0)
Hemoglobin: 5.8 g/dL — CL (ref 13.0–17.0)
MCH: 27.6 pg (ref 26.0–34.0)
MCH: 29 pg (ref 26.0–34.0)
MCV: 82.2 fL (ref 78.0–100.0)
RBC: 2.1 MIL/uL — ABNORMAL LOW (ref 4.22–5.81)
RBC: 2.41 MIL/uL — ABNORMAL LOW (ref 4.22–5.81)
RDW: 15.7 % — ABNORMAL HIGH (ref 11.5–15.5)
WBC: 11.2 10*3/uL — ABNORMAL HIGH (ref 4.0–10.5)

## 2012-11-22 LAB — TROPONIN I: Troponin I: <0.3 ng/mL (ref <0.30)

## 2012-11-22 MED ORDER — SODIUM CHLORIDE 0.9 % IV BOLUS (SEPSIS)
500.0000 mL | Freq: Once | INTRAVENOUS | Status: AC
  Administered 2012-11-22: 500 mL via INTRAVENOUS

## 2012-11-22 NOTE — H&P (Signed)
INTERNAL MEDICINE TEACHING SERVICE Attending Admission Note  Date: 11/22/2012  Patient name: Antonio Ramos  Medical record number: [id number]  Date of birth: 1960-07-30    I have seen and evaluated Dorene Sorrow and discussed their care with the Residency Team.  52 yr. Old AAM w/ hx PUD, HTN, with significant UGI bleed.  He has had episodes of hypotension and is currently s/p 4 Units of PRBCs transfusion. He has been seen by GI who recommended a EGD to him and the patient refuses.   I had a long discussion with him about the seriousness of his condition.  He is oriented to person, place, time, and situation.  He is able to repeat back to me his current medical condition.  I tried multiple times to convince him to have an EGD in order to have intervention of a likely UGI source (clipping, cautery, etc).  He states he is not interested and he "knows his body".  I clearly told him that he can continue to bleed and die from this.  He understands and states "bet me he will come back on Monday" to show me he is fine.  He insists on taking Maalox and curing this on his own. I explained to him that this will not help, as he is having a significant bleed that needs physical intervention.  I asked if I could speak to family and he refuses.   He is threatening to leave AMA this afternoon. He seems to have a general distrust of physicians and people in healthcare field, I'm not sure why. Continue to check H/H Q6hrs and transfuse as appropriate.  I would start giving him FFP at this point per unit of blood due to possibility of dilutional coagulopathy. Watch Ca.   Jonah Blue, DO 8/19/201412:16 PM

## 2012-11-22 NOTE — Progress Notes (Signed)
Subjective:  Overnight the patient's hemoglobin dropped from 7.2 to 5.8. The night team ordered 2 U of PRBCs. Patient continues to have borderline low BP and HR in the 80's. He adamantly refuses EGD. Wants to go home today AMA once his blood transfusion ends. I talked extensively with him about the risk of leaving AMA with an active GI bleed. The patient states that he knows his body and that he will be fine.  Objective: Vital signs in last 24 hours: Filed Vitals:   11/22/12 0630 11/22/12 0631 11/22/12 0645 11/22/12 0747  BP: 93/60 100/46 92/57 91/55   Pulse: 100 67 82 87  Temp:    98.3 F (36.8 C)  TempSrc:    Oral  Resp: 31 14 16 17   Height:      Weight:      SpO2:  100% 100% 100%   Weight change:   Intake/Output Summary (Last 24 hours) at 11/22/12 0803 Last data filed at 11/22/12 0600  Gross per 24 hour  Intake 3238.75 ml  Output   1225 ml  Net 2013.75 ml  General: alert, cooperative, and in no apparent distress HEENT: pupils equal round and reactive to light, vision grossly intact, oropharynx clear and non-erythematous  Neck: supple, no lymphadenopathy Lungs: clear to ascultation bilaterally, normal work of respiration, no wheezes, rales, ronchi Heart: regular rate and rhythm, no murmurs, gallops, or rubs Abdomen: soft, non-tender, non-distended, normal bowel sounds  Extremities: no cyanosis, or edema Neurologic: alert & oriented X3, strength grossly intact, sensation intact to light touch  Lab Results: Basic Metabolic Panel:  Recent Labs Lab 11/21/12 0905  NA 139  K 4.0  CL 109  CO2 25  GLUCOSE 105*  BUN 28*  CREATININE 1.00  CALCIUM 8.2*   Liver Function Tests:  Recent Labs Lab 11/21/12 0905  AST 11  ALT 7  ALKPHOS 46  BILITOT 0.2*  PROT 5.3*  ALBUMIN 2.9*  CBC:  Recent Labs Lab 11/21/12 0905 11/21/12 1854 11/22/12 0043  WBC 9.2 11.6* 10.3  NEUTROABS 6.8  --   --   HGB 6.5* 7.2* 5.8*  HCT 19.3* 21.3* 16.7*  MCV 76.0* 80.4 79.5  PLT 204  151 128*   Cardiac Enzymes:  Recent Labs Lab 11/21/12 1855 11/22/12 0043  TROPONINI <0.30 <0.30  Coagulation:  Recent Labs Lab 11/21/12 0905  LABPROT 16.7*  INR 1.39     Micro Results: Recent Results (from the past 240 hour(s))  MRSA PCR SCREENING     Status: None   Collection Time    11/21/12  3:09 PM      Result Value Range Status   MRSA by PCR NEGATIVE  NEGATIVE Final   Comment:            The GeneXpert MRSA Assay (FDA     approved for NASAL specimens     only), is one component of a     comprehensive MRSA colonization     surveillance program. It is not     intended to diagnose MRSA     infection nor to guide or     monitor treatment for     MRSA infections.   Studies/Results: No results found. Medications: I have reviewed the patient's current medications. Scheduled Meds: . [START ON 11/24/2012] pantoprazole (PROTONIX) IV  40 mg Intravenous Q12H   Continuous Infusions: . sodium chloride 125 mL/hr at 11/21/12 1515  . pantoprozole (PROTONIX) infusion 8 mg/hr (11/22/12 0600)   PRN Meds:.acetaminophen, acetaminophen, morphine injection, ondansetron (ZOFRAN)  IV, ondansetron Assessment/Plan: Principal Problem:   Acute GI bleeding Active Problems:   PUD (peptic ulcer disease)  Assessment & Plan by Problem:  The patient is a 52 yo man, history of PUD, presenting with an acute GI bleed   # Acute GI bleed - The patient presents with bloody vomitus, as well as both bright and dark red painless blood per rectum, with Hb = 6.5 (previously 8.7 on 10/20/12). Differential includes bleeding PUD (history of same, though no significant recent risk factors for GI bleed. Uncertain H pylori status). Less likely diverticulosis (given dark blood per rectum).  -protonix bolus and drip  -1 L NS bolus, followed by 125 cc/hr  -transfused 2 units pRBC's by ED  -NPO  -GI consulted, rec EGD. Patient is adamantly refusing EGD -cbc q6hrs  F/U LDH, Reticulocytes, Haptoglobin Consult  Pastoral Care  # EKG abnormality - TWI in inferolateral leads. Pt with no chest pain. He states he had a "mini" heart attack 10 years ago, which could explain these findings. Since we have no old EKG for comparison, will cycle trop x3.  -cycle trop x3 (all negative)   # HTN - pt reports history of HTN, but not on any home BP medications, and BP borderline low-normal this admission (in setting of acute GI bleed)  -monitor for now. Will not start treatment now, due to acute GI bleed and concern for hypoTN   Dispo: Disposition is deferred at this time, awaiting improvement of current medical problems.  Anticipated discharge in approximately 2-3 day(s).   The patient does not have a current PCP (Provider Default, MD) and does need an [facility] hospital follow-up appointment after discharge.  The patient does not have transportation limitations that hinder transportation to clinic appointments.  .Services Needed at time of discharge: Y = Yes, Blank = No PT:   OT:   RN:   Equipment:   Other:     LOS: 1 day   Pleas Koch, MD 11/22/2012, 8:03 AM

## 2012-11-22 NOTE — Progress Notes (Signed)
[facility] PROGRESS NOTE Subjective No gross bleeding, pt still refuses to consider EGD. Hg down  Objective: Vital signs in last 24 hours: Temp:  [97.6 F (36.4 C)-98.7 F (37.1 C)] 98.3 F (36.8 C) (08/19 0747) Pulse Rate:  [63-100] 87 (08/19 0747) Resp:  [10-31] 17 (08/19 0747) BP: (78-156)/(36-91) 91/55 mmHg (08/19 0747) SpO2:  [94 %-100 %] 100 % (08/19 0747) Weight:  [82.5 kg (181 lb 14.1 oz)-83.462 kg (184 lb)] 82.5 kg (181 lb 14.1 oz) (08/18 1507) Last BM Date: 11/21/12  Intake/Output from previous day: 08/18 0701 - 08/19 0700 In: 3238.8 [I.V.:2011.3; Blood:727.5; IV Piggyback:500] Out: 1225 [Urine:1225] Intake/Output this shift:    PE: General--alert and oriented Heart-- Lungs-- Abdomen--nontender  Lab Results:  Recent Labs  11/21/12 0905 11/21/12 1854 11/22/12 0043  WBC 9.2 11.6* 10.3  HGB 6.5* 7.2* 5.8*  HCT 19.3* 21.3* 16.7*  PLT 204 151 128*   BMET  Recent Labs  11/21/12 0905  NA 139  K 4.0  CL 109  CO2 25  CREATININE 1.00   LFT  Recent Labs  11/21/12 0905  PROT 5.3*  AST 11  ALT 7  ALKPHOS 46  BILITOT 0.2*   PT/INR  Recent Labs  11/21/12 0905  LABPROT 16.7*  INR 1.39   PANCREAS No results found for this basename: LIPASE,  in the last 72 hours       Studies/Results: No results found.  Medications: I have reviewed the patient's current medications.  Assessment/Plan: 1. GI Bleed. Probably UGI pt completely refused EGD or taking antacid meds for unclear reasons. I told him that he could die from this and he said that the lord would take care of him. He stated that he is going to leave AMA after his blood transfusion has been completed. ? If psych, pastoral care etc could talk to him   Shelena Castelluccio JR,Braddock Servellon L 11/22/2012, 8:18 AM

## 2012-11-22 NOTE — Progress Notes (Signed)
Dr.Cater made aware of H/H results 5.8/16.4 B/P 83/40. Pt has denied any c/o pain or nausea. 500 cc bolus infusing. Pt presently asleep. No obvious signs of bleeding.

## 2012-11-22 NOTE — Discharge Summary (Signed)
Name: Antonio Ramos MRN: [id number] DOB: September 16, 1960 52 y.o. PCP: Provider Default, MD  Date of Admission: 11/21/2012  8:31 AM Date of Discharge: 11/22/2012 Attending Physician: No att. providers found  Discharge Diagnosis: 1. Acute GI bleeding 2. PUD (peptic ulcer disease)  Discharge Medications:   Medication List    Notice   You have not been prescribed any medications.      Disposition and follow-up:   [name] was discharged from [facility] in Serious condition.  At the hospital follow up visit please address:  1.  Active and ongoing GI bleet  2.  Labs / imaging needed at time of follow-up: CBC  3.  Pending labs/ test needing follow-up: None, patient left AMA  Follow-up Appointments:   Discharge Instructions:  The patient left AMA.  Consultations: Treatment Team:  Vertell Novak., MD  Admission HPI: The patient is a 52 yo man, history of PUD, HTN, presenting with blood per rectum. The patient was in his usual state of health until 4pm on the day prior to admission, when he developed nausea. Around 8pm, he experienced 1 episode of vomiting, with bright red blood. This morning, the patient noted 1 episode of vomiting with both bright and dark red blood, as well as 2 painless bowel movements with mixed dark and bright red blood. The patient notes associated lightheadedness with BP this morning. He notes no abdominal pain, chest pain, rectal pain. He notes eating a spicy meal 3 days ago, drinks 1 beer/week (last drink 3 days ago), but notes no NSAID or steroid usage. The patient takes maalox daily, but does not take a daily PPI/H2 blocker per pt report. The patient does not have a PCP.  Hospital Course by problem list: Principal Problem:   Acute GI bleeding Active Problems:   PUD (peptic ulcer disease)   1. Acute GI bleed.  The patient presents with bloody vomitus, as well as both bright and dark red painless blood per rectum, with Hb = 6.5  (previously 8.7 on 10/20/12). Differential includes bleeding PUD (had previously required 9 units for previously bleeding PUD) and less likely diverticulosis (given dark blood per rectum and bright red blood in vomitus). Overnight the patient's hemoglobin dropped from 7.2 to 5.8. The patient was administered 2 U of PRBCs and his Hg rose to 7.0. Patient had to have borderline low BP, despite being chronically HTN, and HR in the 80's. He adamantly refused EGD on admission and during the morning. The patient stated that he plans to go home today AMA once his blood transfusion ends. I talked extensively with him about the risk of leaving AMA with an active GI bleed. I clearly explained that the patient could die from this condition if he left the hospital.  The patient's wife helped to convince the patient to stay and receive treatment. The patient admitted that he has a court date for tomorrow (11-23-12) that he must attend. I wrote the patient a letter stating that he is in the hospital and that could not be at his court date. I planned to contact the patients lawyer and fax him the letter so that he would have it at the hearing tomorrow. The patient was originally agreeable with this plan. However, the patient changed his mind and decided to leave AMA. I explained the risks of leaving AMA, including death, and the patient stated that he understood. The patient is likely to be readmitted.   Discharge Vitals:   BP 117/62  Pulse 84  Temp(Src) 98.6 F (37 C) (Oral)  Resp 17  Ht 6\' 2"  (1.88 m)  Wt 181 lb 14.1 oz (82.5 kg)  BMI 23.34 kg/m2  SpO2 100%  Discharge Labs:  Results for orders placed during the hospital encounter of 11/21/12 (from the past 24 hour(s))  CBC     Status: Abnormal   Collection Time    11/21/12  6:54 PM      Result Value Range   WBC 11.6 (*) 4.0 - 10.5 K/uL   RBC 2.65 (*) 4.22 - 5.81 MIL/uL   Hemoglobin 7.2 (*) 13.0 - 17.0 g/dL   HCT 11.9 (*) 14.7 - 82.9 %   MCV 80.4  78.0 - 100.0  fL   MCH 27.2  26.0 - 34.0 pg   MCHC 33.8  30.0 - 36.0 g/dL   RDW 56.2 (*) 13.0 - 86.5 %   Platelets 151  150 - 400 K/uL  TROPONIN I     Status: None   Collection Time    11/21/12  6:55 PM      Result Value Range   Troponin I <0.30  <0.30 ng/mL  TROPONIN I     Status: None   Collection Time    11/22/12 12:43 AM      Result Value Range   Troponin I <0.30  <0.30 ng/mL  CBC     Status: Abnormal   Collection Time    11/22/12 12:43 AM      Result Value Range   WBC 10.3  4.0 - 10.5 K/uL   RBC 2.10 (*) 4.22 - 5.81 MIL/uL   Hemoglobin 5.8 (*) 13.0 - 17.0 g/dL   HCT 78.4 (*) 69.6 - 29.5 %   MCV 79.5  78.0 - 100.0 fL   MCH 27.6  26.0 - 34.0 pg   MCHC 34.7  30.0 - 36.0 g/dL   RDW 28.4 (*) 13.2 - 44.0 %   Platelets 128 (*) 150 - 400 K/uL  PREPARE RBC (CROSSMATCH)     Status: None   Collection Time    11/22/12  4:00 AM      Result Value Range   Order Confirmation ORDER PROCESSED BY BLOOD BANK    CBC     Status: Abnormal   Collection Time    11/22/12 11:35 AM      Result Value Range   WBC 11.2 (*) 4.0 - 10.5 K/uL   RBC 2.41 (*) 4.22 - 5.81 MIL/uL   Hemoglobin 7.0 (*) 13.0 - 17.0 g/dL   HCT 10.2 (*) 72.5 - 36.6 %   MCV 82.2  78.0 - 100.0 fL   MCH 29.0  26.0 - 34.0 pg   MCHC 35.4  30.0 - 36.0 g/dL   RDW 44.0 (*) 34.7 - 42.5 %   Platelets 114 (*) 150 - 400 K/uL    Signed: Pleas Koch, MD 11/22/2012, 4:58 PM   Time Spent on Discharge: 20 minutes Services Ordered on Discharge: None Equipment Ordered on Discharge: None

## 2012-11-23 ENCOUNTER — Inpatient Hospital Stay (HOSPITAL_COMMUNITY): Payer: BC Managed Care – PPO

## 2012-11-23 ENCOUNTER — Encounter (HOSPITAL_COMMUNITY)
Admission: EM | Disposition: A | Discharge status: Home / Self Care | Payer: Self-pay | Source: Home / Self Care | Attending: Pulmonary Disease

## 2012-11-23 ENCOUNTER — Inpatient Hospital Stay (HOSPITAL_COMMUNITY)
Admission: EM | Admit: 2012-11-23 | Discharge: 2012-11-25 | DRG: 552 | Disposition: A | Discharge status: Home / Self Care | Payer: BC Managed Care – PPO | Attending: Internal Medicine | Admitting: Internal Medicine

## 2012-11-23 ENCOUNTER — Encounter (HOSPITAL_COMMUNITY): Payer: Self-pay | Admitting: Emergency Medicine

## 2012-11-23 DIAGNOSIS — R578 Other shock: Secondary | ICD-10-CM

## 2012-11-23 DIAGNOSIS — K259 Gastric ulcer, unspecified as acute or chronic, without hemorrhage or perforation: Secondary | ICD-10-CM | POA: Diagnosis present

## 2012-11-23 DIAGNOSIS — R404 Transient alteration of awareness: Secondary | ICD-10-CM | POA: Diagnosis present

## 2012-11-23 DIAGNOSIS — K279 Peptic ulcer, site unspecified, unspecified as acute or chronic, without hemorrhage or perforation: Secondary | ICD-10-CM

## 2012-11-23 DIAGNOSIS — D684 Acquired coagulation factor deficiency: Secondary | ICD-10-CM | POA: Diagnosis present

## 2012-11-23 DIAGNOSIS — D62 Acute posthemorrhagic anemia: Secondary | ICD-10-CM | POA: Diagnosis present

## 2012-11-23 DIAGNOSIS — Z8711 Personal history of peptic ulcer disease: Secondary | ICD-10-CM | POA: Diagnosis present

## 2012-11-23 DIAGNOSIS — K922 Gastrointestinal hemorrhage, unspecified: Secondary | ICD-10-CM

## 2012-11-23 DIAGNOSIS — I10 Essential (primary) hypertension: Secondary | ICD-10-CM

## 2012-11-23 DIAGNOSIS — K264 Chronic or unspecified duodenal ulcer with hemorrhage: Principal | ICD-10-CM | POA: Diagnosis present

## 2012-11-23 DIAGNOSIS — F17200 Nicotine dependence, unspecified, uncomplicated: Secondary | ICD-10-CM | POA: Diagnosis present

## 2012-11-23 DIAGNOSIS — K449 Diaphragmatic hernia without obstruction or gangrene: Secondary | ICD-10-CM | POA: Diagnosis present

## 2012-11-23 HISTORY — DX: Other shock: R57.8

## 2012-11-23 HISTORY — PX: ESOPHAGOGASTRODUODENOSCOPY: SHX5428

## 2012-11-23 LAB — CBC WITH DIFFERENTIAL/PLATELET
Basophils Absolute: 0 10*3/uL (ref 0.0–0.1)
Basophils Relative: 0 % (ref 0–1)
Hemoglobin: 4.3 g/dL — CL (ref 13.0–17.0)
MCHC: 35 g/dL (ref 30.0–36.0)
Neutro Abs: 17.1 10*3/uL — ABNORMAL HIGH (ref 1.7–7.7)
Neutrophils Relative %: 90 % — ABNORMAL HIGH (ref 43–77)
RDW: 16.4 % — ABNORMAL HIGH (ref 11.5–15.5)

## 2012-11-23 LAB — BASIC METABOLIC PANEL
Chloride: 109 mEq/L (ref 96–112)
GFR calc Af Amer: >90 mL/min (ref >90)
Potassium: 4 mEq/L (ref 3.5–5.1)
Sodium: 140 mEq/L (ref 135–145)

## 2012-11-23 LAB — TYPE AND SCREEN
Antibody Screen: NEGATIVE
Unit division: 0
Unit division: 0

## 2012-11-23 LAB — PREPARE RBC (CROSSMATCH)

## 2012-11-23 LAB — PROTIME-INR: Prothrombin Time: 19.1 seconds — ABNORMAL HIGH (ref 11.6–15.2)

## 2012-11-23 LAB — APTT: aPTT: 29 seconds (ref 24–37)

## 2012-11-23 SURGERY — EGD (ESOPHAGOGASTRODUODENOSCOPY)
Anesthesia: Moderate Sedation

## 2012-11-23 MED ORDER — SODIUM CHLORIDE 0.9 % IV SOLN: INTRAVENOUS | Status: DC

## 2012-11-23 MED ORDER — SODIUM CHLORIDE 0.9 % IV BOLUS (SEPSIS)
1000.0000 mL | Freq: Once | INTRAVENOUS | Status: AC
  Administered 2012-11-23: 1000 mL via INTRAVENOUS

## 2012-11-23 MED ORDER — SODIUM CHLORIDE 0.9 % IV SOLN
1.0000 g | Freq: Once | INTRAVENOUS | Status: AC
  Administered 2012-11-23: 1 g via INTRAVENOUS
  Filled 2012-11-23: qty 10

## 2012-11-23 MED ORDER — SODIUM CHLORIDE 0.9 % IJ SOLN
3.0000 mL | Freq: Two times a day (BID) | INTRAMUSCULAR | Status: DC
  Administered 2012-11-24: 3 mL via INTRAVENOUS
  Administered 2012-11-24: 22:00:00 via INTRAVENOUS
  Administered 2012-11-25: 3 mL via INTRAVENOUS

## 2012-11-23 MED ORDER — SODIUM CHLORIDE 0.9 % IV SOLN
8.0000 mg/h | INTRAVENOUS | Status: DC
  Administered 2012-11-23 – 2012-11-24 (×2): 8 mg/h via INTRAVENOUS
  Filled 2012-11-23 (×5): qty 80

## 2012-11-23 MED ORDER — ONDANSETRON HCL 4 MG/2ML IJ SOLN: 4.0000 mg | Freq: Four times a day (QID) | INTRAMUSCULAR | Status: DC | PRN

## 2012-11-23 MED ORDER — MIDAZOLAM HCL 10 MG/2ML IJ SOLN
INTRAMUSCULAR | Status: DC | PRN
  Administered 2012-11-23 (×2): 1 mg via INTRAVENOUS
  Administered 2012-11-23 (×2): 2 mg via INTRAVENOUS

## 2012-11-23 MED ORDER — SODIUM CHLORIDE 0.9 % IV SOLN: 250.0000 mL | INTRAVENOUS | Status: DC | PRN

## 2012-11-23 MED ORDER — BUTAMBEN-TETRACAINE-BENZOCAINE 2-2-14 % EX AERO
INHALATION_SPRAY | CUTANEOUS | Status: DC | PRN
  Administered 2012-11-23: 2 via TOPICAL

## 2012-11-23 MED ORDER — FENTANYL CITRATE 0.05 MG/ML IJ SOLN
INTRAMUSCULAR | Status: DC | PRN
  Administered 2012-11-23: 25 ug via INTRAVENOUS
  Administered 2012-11-23 (×2): 12.5 ug via INTRAVENOUS

## 2012-11-23 MED ORDER — EPINEPHRINE HCL 1 MG/ML IJ SOLN
INTRAMUSCULAR | Status: DC | PRN
  Administered 2012-11-23: 21:00:00

## 2012-11-23 MED ORDER — SODIUM CHLORIDE 0.9 % IJ SOLN: 3.0000 mL | Freq: Two times a day (BID) | INTRAMUSCULAR | Status: DC

## 2012-11-23 MED ORDER — SODIUM CHLORIDE 0.9 % IJ SOLN: 3.0000 mL | INTRAMUSCULAR | Status: DC | PRN

## 2012-11-23 NOTE — ED Notes (Signed)
Spoke with RT, will come draw bld.

## 2012-11-23 NOTE — Progress Notes (Signed)
RT stuck artery for labs due to patient being hard stick. Attempts x 1. 10cc obtained, RT to monitor.

## 2012-11-23 NOTE — Progress Notes (Signed)
Admitted from the ED awake and alert,  1 unit of prbc  In progress.

## 2012-11-23 NOTE — Progress Notes (Signed)
S: patient is noted to have one episode of ? Syncope after he got up passing some black tarry stools. His IV access was accidentally falling off. Now he does not have any IV access and BP MAP ~ 55-60.  O: General : feeling dizzy. Mild distress from GIB      Neuro: A+O x 3  A/P - Transfer to ICU - PCCM called for CVP line insertion - GI is at bedside and ready to perform EGD once CVP is in.  - Discussed with PCCM Dr. Kendrick Fries who will be the attending physician.   Dede Query, MD, PGY-3 IMTS 7:32 PM

## 2012-11-23 NOTE — Consult Note (Signed)
PULMONARY  / CRITICAL CARE MEDICINE  Name: Antonio Ramos MRN: [id number] DOB: 12-Mar-1961    ADMISSION DATE:  11/23/2012 CONSULTATION DATE:  11/23/2012  REFERRING MD :  Kem Kays (IM Teaching Service) PRIMARY SERVICE: PCCM  CHIEF COMPLAINT:  GI bleed  BRIEF PATIENT DESCRIPTION: 52 y/o male with history of PUD/HTN, was admitted on 8/20 with an upper GI bleed and a Hgb of 4.3.  SIGNIFICANT EVENTS / STUDIES:  8/20 admission  LINES / TUBES: 8/20 L IJ CVL >>  CULTURES:   ANTIBIOTICS:   HISTORY OF PRESENT ILLNESS:   52 y/o male with history of PUD/HTN, was admitted on 8/20 with an upper GI bleed and a Hgb of 4.3. He [facility] 8/19 throwing up blood and was advised to be admitted but left AMA to make a court appearance.  He represented on 8/20 with maroon stools and coffee ground emesis.  He denies pain of any kind.  He noted last passing blood around 1800 on 8/20.  He says that he first noticed passing dark, bloody stools one week ago.  He noted throwing up blood "a couple of times" in the last week.  PAST MEDICAL HISTORY :  Past Medical History  Diagnosis Date  . Hypertension   . PUD (peptic ulcer disease)     Diagnosed in 1986, with 3 prior bleeds since that time, most recently in 2004   Past Surgical History  Procedure Laterality Date  . Upper gi endoscopy     Prior to Admission medications   Not on File   No Known Allergies  FAMILY HISTORY:  Family History  Problem Relation Age of Onset  . Heart attack Father     at age of 75   SOCIAL HISTORY:  reports that he has been smoking Cigarettes.  He has a 24 pack-year smoking history. He does not have any smokeless tobacco history on file. He reports that he drinks about 0.5 ounces of alcohol per week. He reports that he does not use illicit drugs.  REVIEW OF SYSTEMS:   Gen: Denies fever, chills, weight change, fatigue, night sweats HEENT: Denies blurred vision, double vision, hearing loss, tinnitus,  sinus congestion, rhinorrhea, sore throat, neck stiffness, dysphagia PULM: Denies shortness of breath, cough, sputum production, hemoptysis, wheezing CV: Denies chest pain, edema, orthopnea, paroxysmal nocturnal dyspnea, palpitations GI: per HPI GU: Denies dysuria, hematuria, polyuria, oliguria, urethral discharge Endocrine: Denies hot or cold intolerance, polyuria, polyphagia or appetite change Derm: Denies rash, dry skin, scaling or peeling skin change Heme: Denies easy bruising, bleeding, bleeding gums Neuro: Denies headache, numbness, weakness, slurred speech, loss of memory or consciousness   SUBJECTIVE:   VITAL SIGNS: Temp:  [97.5 F (36.4 C)-98.1 F (36.7 C)] 98.1 F (36.7 C) (08/20 1747) Pulse Rate:  [79-109] 105 (08/20 1747) Resp:  [10-24] 19 (08/20 1900) BP: (96-134)/(44-68) 108/55 mmHg (08/20 1900) SpO2:  [98 %-100 %] 100 % (08/20 1747) Weight:  [82.2 kg (181 lb 3.5 oz)] 82.2 kg (181 lb 3.5 oz) (08/20 1739) HEMODYNAMICS:   VENTILATOR SETTINGS:   INTAKE / OUTPUT: Intake/Output     08/20 0701 - 08/21 0700   I.V. (mL/kg) 250 (3)   Blood 350   Total Intake(mL/kg) 600 (7.3)   Net +600         PHYSICAL EXAMINATION:  Gen: acutely ill appearing HEENT: NCAT, EOMi, MM dry PULM; CTA B CV: TAchy, no murmur or gallop, no JVD AB: no masses, guarding or rebound, hyperactive bowel sounds  Ext: cool Derm: diaphoretic Neuro: Somnolent but arouses to voice, oriented, conversant, maew  LABS:  CBC Recent Labs     11/22/12  0043  11/22/12  1135  11/23/12  1323  WBC  10.3  11.2*  18.9*  HGB  5.8*  7.0*  4.3*  HCT  16.7*  19.8*  12.3*  PLT  128*  114*  141*   Coag's Recent Labs     11/21/12  0905  11/23/12  1323  APTT   --   29  INR  1.39  1.66*   BMET Recent Labs     11/21/12  0905  11/23/12  1050  NA  139  140  K  4.0  4.0  CL  109  109  CO2  25  22  BUN  28*  28*  CREATININE  1.00  1.01  GLUCOSE  105*  117*   Electrolytes Recent Labs      11/21/12  0905  11/23/12  1050  CALCIUM  8.2*  7.9*   Sepsis Markers No results found for this basename: LACTICACIDVEN, PROCALCITON, O2SATVEN,  in the last 72 hours ABG No results found for this basename: PHART, PCO2ART, PO2ART,  in the last 72 hours Liver Enzymes Recent Labs     11/21/12  0905  AST  11  ALT  7  ALKPHOS  46  BILITOT  0.2*  ALBUMIN  2.9*   Cardiac Enzymes Recent Labs     11/21/12  1855  11/22/12  0043  TROPONINI  <0.30  <0.30   Glucose No results found for this basename: GLUCAP,  in the last 72 hours   CXR: L IJ CVL in place, lungs clear  ASSESSMENT / PLAN:  GASTROINTESTINAL A: Acute Upper GI bleed> presumably recurrent PUD, ddx includes AVM, less likely mallory weiss; does not have stigmata of cirrhosis on exam Earlier intervention would have been better as we are greatly behind on resuscitation P:   -PPI gtt -Endoscopy now -see Heme  HEMATOLOGIC A:  Anemia from GI bleed Coagulopathy from GI bleed P:  -transfuse 3 U PRBC stat -q6h h/h  -2 U FFP now -repeat INR in AM -Calcium gluconate x1 -monitor PLT  PULMONARY A: No acute issues P:   -monitor O2 saturation closely  CARDIOVASCULAR A:  Hemorrhagic shock P:  -aggressive volume resuscitation -see Heme/GI  RENAL A:  No acute issues P:   -monitor UOP/BMET  INFECTIOUS A:  No acute issues P:   -monitor fever curve  ENDOCRINE A:  No acute issue P:   -monitor glucose  NEUROLOGIC A:  Slightly somnolent but arouses easily P:   -minimize sedation  TODAY'S SUMMARY:   I have personally obtained a history, examined the patient, evaluated laboratory and imaging results, formulated the assessment and plan and placed orders. CRITICAL CARE: The patient is critically ill with multiple organ systems failure and requires high complexity decision making for assessment and support, frequent evaluation and titration of therapies, application of advanced monitoring technologies and  extensive interpretation of multiple databases. Critical Care Time devoted to patient care services described in this note is 60 minutes.   Fonnie Jarvis Pulmonary and Critical Care Medicine [facility] Pager: [phone]  11/23/2012, 7:23 PM

## 2012-11-23 NOTE — Procedures (Signed)
Central Venous Catheter Insertion Procedure Note Khary Schaben [id number] 1960/07/22  Procedure: Insertion of Central Venous Catheter Indications: Assessment of intravascular volume and Drug and/or fluid administration  Procedure Details Consent: Risks of procedure as well as the alternatives and risks of each were explained to the (patient/caregiver).  Consent for procedure obtained. Time Out: Verified patient identification, verified procedure, site/side was marked, verified correct patient position, special equipment/implants available, medications/allergies/relevent history reviewed, required imaging and test results available.  Performed  Maximum sterile technique was used including antiseptics, cap, gloves, gown, hand hygiene, mask and sheet. Skin prep: Chlorhexidine; local anesthetic administered A antimicrobial bonded/coated triple lumen catheter was placed in the left internal jugular vein using the Seldinger technique.  Ultrasound was used to verify the patency of the vein and for real time needle guidance.  Evaluation Blood flow good Complications: No apparent complications Patient did tolerate procedure well. Chest X-ray ordered to verify placement.  CXR: normal.  MCQUAID, DOUGLAS 11/23/2012, 8:15 PM

## 2012-11-23 NOTE — Op Note (Signed)
[facility] [address] [place], [address]   ENDOSCOPY PROCEDURE REPORT  PATIENT: Antonio, Ramos  MR#: [id number] BIRTHDATE: [birth year] , 52  yrs. old GENDER: Male  ENDOSCOPIST: Vida Rigger, MD REFERRED BY:  PROCEDURE DATE:  11/23/2012 PROCEDURE:   EGD w/ control of bleeding ASA CLASS:   Class II INDICATIONS:Hematemesis and Acute post hemorrhagic anemia.  MEDICATIONS: Fentanyl 50 mcg IV and Versed 6 mg IV  TOPICAL ANESTHETIC:used  DESCRIPTION OF PROCEDURE:   After the risks benefits and alternatives of the procedure were thoroughly explained, informed consent was obtained.  The Pentax Gastroscope [id number]  endoscope was introduced through the mouth and advanced to the second portion of the duodenum , limited by Without limitations.   The instrument was slowly withdrawn as the mucosa was fully examined.the findings are recorded below but he seemed to have some oozing from the distal duodenal bulb and we proceeded with 5 injections of a total of 1-10,000 epinephrine with cessation of the bleeding and no other at risk lesions were seen although he did have some findings as noted below and at the end of the procedure and air and  water old blood was suctioned endoscope was removed and the patient tolerated the procedure well there was no obvious immediate complication          FINDINGS: 1. Small hiatal hernia 2. Old blood in the stomach #3 few antral erosions 4.  bulb scarring from previous ulcer disease 5 few linear ulcers with white base in the bulb 6. Oozing from the C-loop junction along fold status post epi injection as above with good cessation of bleedingtCOMPLICATIONS: none  ENDOSCOPIC IMPRESSION: above   RECOMMENDATIONS:observe for delayed complications chips and sips only tonight and if doing well clear liquid diet tomorrow and check an H. pylori blood test and treat at some point in the future and no aspirin or nonsteroidals and  3 months of pump inhibitors and repeat endoscopy when necessary   REPEAT EXAM: as needed   _______________________________ Vida Rigger, MD eSigned:  Vida Rigger, MD 11/23/2012 9:07 PM    CC:  PATIENT NAME:  Antonio, Ramos MR#: [id number]

## 2012-11-23 NOTE — ED Notes (Signed)
Pt reports he has been feeling a little lightheaded. sts this is the first time he has vomited blood but has had blood in his stool.

## 2012-11-23 NOTE — Progress Notes (Signed)
Patient asked for urinal went to get from the [employer] and when got back in the room found on the floor witnessed by the x -ray technician who claimed that he intentionally rolled himself on the floor despite instructions given by said  X-ray technician not to do it. When pt asked why he did it claimed that he feels warm. Iv line came out, blood transfusion  Stopped. About 150 ml  Infused, blood returned to blood bank since iv team unabled to reinsert a new iv line. MD aware. Pt. Soaked with urine, back to bed, full bath given. Continue  To monitor.

## 2012-11-23 NOTE — ED Provider Notes (Signed)
CSN: [id number]     Arrival date & time 11/23/12  1038 History     First MD Initiated Contact with Patient 11/23/12 1048     Chief Complaint  Patient presents with  . Hematemesis   (Consider location/radiation/quality/duration/timing/severity/associated sxs/prior Treatment) HPI Comments: Patient is a 52 year old male with a past medical history of hypertension and PUD who presents with an episode of hematemesis this morning. Patient reports being in court and suddenly "throwing up blood." Patient was admitted 2 days ago for a lower GI bleed after complaining of blood in his stool. The patient left AMA yesterday because he had to appear in court today. Patient came back to the hospital after vomiting blood this morning. Patient states this is the first time he has vomited blood. He has no other complaints at this time. No associated symptoms.    Past Medical History  Diagnosis Date  . Hypertension   . PUD (peptic ulcer disease)     Diagnosed in 1986, with 3 prior bleeds since that time, most recently in 2004   Past Surgical History  Procedure Laterality Date  . Upper gi endoscopy     Family History  Problem Relation Age of Onset  . Heart attack Father     at age of 4   History  Substance Use Topics  . Smoking status: Current Some Day Smoker -- 1.00 packs/day for 24 years    Types: Cigarettes  . Smokeless tobacco: Not on file  . Alcohol Use: 0.5 oz/week    1 drink(s) per week     Comment: states used to drink heavily in 1983    Review of Systems  Gastrointestinal:       Hematemesis  All other systems reviewed and are negative.    Allergies  Review of patient's allergies indicates no known allergies.  Home Medications  No current outpatient prescriptions on file. BP 104/52  Pulse 91  Temp(Src) 97.5 F (36.4 C) (Oral)  Resp 18  SpO2 100% Physical Exam  Nursing note and vitals reviewed. Constitutional: He is oriented to person, place, and time. He appears  well-developed and well-nourished. No distress.  HENT:  Head: Normocephalic and atraumatic.  Eyes: Conjunctivae and EOM are normal. Pupils are equal, round, and reactive to light.  Neck: Normal range of motion.  Cardiovascular: Normal rate and regular rhythm.  Exam reveals no gallop and no friction rub.   No murmur heard. Pulmonary/Chest: Effort normal and breath sounds normal. He has no wheezes. He has no rales. He exhibits no tenderness.  Abdominal: Soft. He exhibits no distension. There is no tenderness. There is no rebound and no guarding.  Musculoskeletal: Normal range of motion.  Neurological: He is alert and oriented to person, place, and time. Coordination normal.  Speech is goal-oriented. Moves limbs without ataxia.   Skin: Skin is warm and dry.  Psychiatric: He has a normal mood and affect. His behavior is normal.    ED Course   Procedures (including critical care time)  Labs Reviewed  CBC WITH DIFFERENTIAL - Abnormal; Notable for the following:    WBC 18.9 (*)    RBC 1.48 (*)    Hemoglobin 4.3 (*)    HCT 12.3 (*)    RDW 16.4 (*)    Platelets 141 (*)    Neutrophils Relative % 90 (*)    Neutro Abs 17.1 (*)    Lymphocytes Relative 6 (*)    All other components within normal limits  BASIC METABOLIC PANEL -  Abnormal; Notable for the following:    Glucose, Bld 117 (*)    BUN 28 (*)    Calcium 7.9 (*)    GFR calc non Af Amer 82 (*)    All other components within normal limits  PROTIME-INR - Abnormal; Notable for the following:    Prothrombin Time 19.1 (*)    INR 1.66 (*)    All other components within normal limits  HEMOGLOBIN AND HEMATOCRIT, BLOOD - Abnormal; Notable for the following:    Hemoglobin 8.3 (*)    HCT 22.5 (*)    All other components within normal limits  BASIC METABOLIC PANEL - Abnormal; Notable for the following:    Calcium 7.5 (*)    All other components within normal limits  HEPATIC FUNCTION PANEL - Abnormal; Notable for the following:    Total  Protein 3.7 (*)    Albumin 2.1 (*)    Alkaline Phosphatase 27 (*)    All other components within normal limits  PROTIME-INR - Abnormal; Notable for the following:    Prothrombin Time 17.7 (*)    INR 1.50 (*)    All other components within normal limits  HEMOGLOBIN AND HEMATOCRIT, BLOOD - Abnormal; Notable for the following:    Hemoglobin 8.7 (*)    HCT 24.0 (*)    All other components within normal limits  CBC - Abnormal; Notable for the following:    WBC 13.7 (*)    RBC 2.92 (*)    Hemoglobin 8.9 (*)    HCT 24.8 (*)    Platelets 95 (*)    All other components within normal limits  H. PYLORI ANTIBODY, IGG - Abnormal; Notable for the following:    H Pylori IgG 2.55 (*)    All other components within normal limits  BASIC METABOLIC PANEL - Abnormal; Notable for the following:    Calcium 7.8 (*)    GFR calc non Af Amer 81 (*)    All other components within normal limits  CBC - Abnormal; Notable for the following:    WBC 10.6 (*)    RBC 2.55 (*)    Hemoglobin 7.8 (*)    HCT 21.9 (*)    RDW 15.7 (*)    Platelets 100 (*)    All other components within normal limits  PROTIME-INR - Abnormal; Notable for the following:    Prothrombin Time 16.9 (*)    All other components within normal limits  CBC WITH DIFFERENTIAL - Abnormal; Notable for the following:    WBC 10.6 (*)    RBC 2.74 (*)    Hemoglobin 8.4 (*)    HCT 23.5 (*)    RDW 15.6 (*)    Platelets 112 (*)    Neutrophils Relative % 79 (*)    Neutro Abs 8.3 (*)    Lymphocytes Relative 11 (*)    All other components within normal limits  APTT  URINE RAPID DRUG SCREEN (HOSP PERFORMED)  TYPE AND SCREEN  PREPARE RBC (CROSSMATCH)  PREPARE RBC (CROSSMATCH)  PREPARE FRESH FROZEN PLASMA  PREPARE RBC (CROSSMATCH)   Portable Chest 1 View  11/23/2012   CLINICAL DATA:  Central line placement. Hypertension.  EXAM: PORTABLE CHEST - 1 VIEW  COMPARISON:  None.  FINDINGS: Left internal jugular central venous catheter tip: SVC. No  pneumothorax.  Heart size within normal limits. Borderline prominence of the main pulmonary artery. The lungs appear clear.  IMPRESSION: 1. Left IJ line tip: SVC. No pneumothorax.   Electronically Signed  By: Herbie Baltimore   On: 11/23/2012 19:58   1. Upper GI bleed   2. Acute GI bleeding   3. Hemorrhagic shock   4. PUD (peptic ulcer disease)   5. Hypertension     MDM  11:14 AM Labs pending. Vitals stable and patient afebrile.   3:36 PM Patient was a difficult stick. Labs show elevated WBC and hemoglobin of 4.3. Patient will be readmitted to Internal Medicine for upper GI bleed. Patient will have blood started.   Emilia Beck, PA-C 11/25/12 1611

## 2012-11-23 NOTE — ED Notes (Signed)
Per EMS - [place] diaphoretic and started vomiting bright red bld. Pt reports he was admitted to the hospital on the 18th for GI bleed, today was the first time he vomited bld. Denies abd pain/cp/sob. Left the hospital AMA yesterday so he could go to court. BP 112/64 then 90/64 HR 100-110s.

## 2012-11-23 NOTE — H&P (Signed)
Date: 11/23/2012               Patient Name:  Antonio Ramos MRN: [id number]  DOB: Oct 07, 1960 Age / Sex: 52 y.o., male   PCP: No Pcp Per Patient         Medical Service: Internal Medicine Teaching Service         Attending Physician: Dr. Jonah Blue, DO    First Contact: Dr. Glendell Docker  Pager: [phone]  Second Contact: Dr. Dierdre Searles Pager: [phone]       After Hours (After 5p/  First Contact Pager: [phone]  weekends / holidays): Second Contact Pager: [phone]   Chief Complaint: Hemetemesis  History of Present Illness:   The patient is a 52 yo man with a PMH of PUD and HTN, who presents with bright red blood hematemesis and dark tarry stool per rectum. Patient reports being in court and suddenly "throwing up blood." Associated symptoms include lightheadedness and dizziness. Denies chest pain, chest pressure, or abdominal pain. He presented to ED after vomiting blood.  Of note, he was admitted to the hospital 2 days ago on 11/21/12 for upper GI bleeding. He reports eating a spicy meal 3 days prior to the previous admission and drinking 1 beer/week (last drink 3 days ago). But denies NSAIDs or steroid usage. And He was given 2 units PRBCs, which raised his HGB from 6.5 to 7.0. However, patient refused EGD and left the hospital AMA on 11/22/12.    Meds: Current Facility-Administered Medications  Medication Dose Route Frequency Provider Last Rate Last Dose  . 0.9 %  sodium chloride infusion   Intravenous Continuous Sunshyne Horvath, MD      . 0.9 %  sodium chloride infusion  250 mL Intravenous PRN Alyda Megna Dierdre Searles, MD      . ondansetron (ZOFRAN) injection 4 mg  4 mg Intravenous Q6H PRN Shravya Wickwire Dierdre Searles, MD      . pantoprazole (PROTONIX) 80 mg in sodium chloride 0.9 % 250 mL infusion  8 mg/hr Intravenous Continuous Rikki Smestad, MD      . sodium chloride 0.9 % bolus 1,000 mL  1,000 mL Intravenous Once Glynn Octave, MD 500 mL/hr at 11/23/12 1625 1,000 mL at 11/23/12 1625  . sodium chloride 0.9 % injection 3 mL  3 mL Intravenous Q12H Soleia Badolato, MD      . sodium chloride 0.9 % injection 3 mL  3 mL Intravenous Q12H Tashiana Lamarca, MD      . sodium chloride 0.9 % injection 3 mL  3 mL Intravenous PRN Dede Query, MD        Allergies: Allergies as of 11/23/2012  . (No Known Allergies)   Past Medical History  Diagnosis Date  . Hypertension   . PUD (peptic ulcer disease)     Diagnosed in 1986, with 3 prior bleeds since that time, most recently in 2004   Past Surgical History  Procedure Laterality Date  . Upper gi endoscopy     Family History  Problem Relation Age of Onset  . Heart attack Father     at age of 58   History   Social History  . Marital Status: Married    Spouse Name: N/A    Number of Children: N/A  . Years of Education: N/A   Occupational History  . Not on file.   Social History Main Topics  . Smoking status: Current Some Day Smoker -- 1.00 packs/day for 24 years    Types: Cigarettes  . Smokeless tobacco:  Not on file  . 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 Drug Use: No  . Sexual Activity: Not on file   Other Topics Concern  . Not on file   Social History Narrative   Moved from [place] to [place] in 2009.  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    Review of Systems: General: no fevers, chills, changes in weight, changes in appetite  Skin: no rash  HEENT: no blurry vision, hearing changes, sore throat  Pulm: no dyspnea, coughing, wheezing  CV: no chest pain, palpitations, shortness of breath  Abd: no abdominal pain, nausea/vomiting +, diarrhea/constipation, melena +  GU: no dysuria, hematuria, polyuria  Ext: no arthralgias, myalgias  Neuro: no weakness, numbness, or tingling  Physical Exam: Blood pressure 134/67, pulse 105, temperature 98.1 F (36.7 C), temperature source Oral, resp. rate 16, height 6\' 2"  (1.88 m), weight 82.2 kg (181 lb 3.5 oz), SpO2 100.00%. General: mild distress due to blood loss HEENT: pupils equal round  and reactive to light, vision grossly intact, oropharynx clear and non-erythematous  Neck: supple, no lymphadenopathy Lungs: clear to auscultation bilaterally, normal work of respiration, no wheezes, rales, ronchi Heart: regular rate and rhythm, systolic murmurs + on the mitral area, no gallops, or rubs Abdomen: soft, non-tender, non-distended, normal bowel sounds  Extremities: no cyanosis, clubbing +,  No edema Neurologic: alert & oriented X3, cranial nerves II-XII intact, strength grossly intact, sensation intact to light touch  Lab results:  Recent Labs Lab 11/21/12 0905 11/21/12 1854 11/22/12 0043 11/22/12 1135 11/23/12 1323  WBC 9.2 11.6* 10.3 11.2* 18.9*  HGB 6.5* 7.2* 5.8* 7.0* 4.3*  HCT 19.3* 21.3* 16.7* 19.8* 12.3*  PLT 204 151 128* 114* 141*  MCV 76.0* 80.4 79.5 82.2 83.1  NEUTROABS 6.8  --   --   --  17.1*  LYMPHSABS 1.6  --   --   --  1.1  MONOABS 0.6  --   --   --  0.7  EOSABS 0.1  --   --   --  0.0  BASOSABS 0.1  --   --   --  0.0    Recent Labs Lab 11/21/12 0905 11/23/12 1323  INR 1.39 1.66*    Other results: Assessment & Plan by Problem: Active Problems:  Assessment Plan  Acute upper GI bleed The patient presents with bright red bloody vomitus, as well as dark tarry painless stools per rectum. His physical examination is unremarkable. His lab showed HGB of 4.3 (HGB 7.0 on 11/22/12). The clinical manifestation is consistent with upper GIB bleeding. The DDX include Esophagitis, Esophageal varices, PUD, Gastritis, Perforated ulcer. Mallory weiss tear, vascular ectasias. AVM, Crest syndrome, and/or gastric cancer.     -Admit to SDU - close monitoring  - NPO - IVF >>2 Liters at ED. NS 200/h - protonix bolus and drip  - transfusion 5 units with 2 additional units ready. -GI consulted, will need EGD tonight. -CBC q6hrs   Acute blood anemia  - see above  Hypertension    - hold all medications in the setting of GIB     VTE: SCD   Dispo:  Disposition is deferred at this time, awaiting improvement of current medical problems.  The patient does not have a current PCP (No Pcp Per Patient) and does need an [facility] hospital follow-up appointment after discharge.  The patient does not  have transportation limitations that hinder transportation to clinic appointments.  Signed: Gretchen Short, MS 3 Dede Query, MD PGY-3 IMTS  11/23/2012, 5:54 PM

## 2012-11-23 NOTE — ED Notes (Signed)
Attempted report 

## 2012-11-23 NOTE — Discharge Summary (Signed)
  Date: 11/23/2012  Patient name: Antonio Ramos  Medical record number: [id number]  Date of birth: [birth year]   This patient has been seen and the plan of care was discussed with the house staff. Please see their note for complete details. I concur with their findings and plan. The patient has left AMA, though we tried to convince him multiple times to have an EGD due to active bleed. He understood and has insight into the risk of bleeding and death upon his choice to leave against our advice.  Jonah Blue, DO, FACP Faculty [facility] Internal Medicine Residency Program 11/23/2012, 1:32 PM

## 2012-11-24 ENCOUNTER — Encounter (HOSPITAL_COMMUNITY): Payer: Self-pay | Admitting: Gastroenterology

## 2012-11-24 LAB — HEMOGLOBIN AND HEMATOCRIT, BLOOD
HCT: 22.5 % — ABNORMAL LOW (ref 39.0–52.0)
Hemoglobin: 8.7 g/dL — ABNORMAL LOW (ref 13.0–17.0)

## 2012-11-24 LAB — CBC
MCH: 30.5 pg (ref 26.0–34.0)
MCV: 84.9 fL (ref 78.0–100.0)
Platelets: 95 10*3/uL — ABNORMAL LOW (ref 150–400)
RBC: 2.92 MIL/uL — ABNORMAL LOW (ref 4.22–5.81)
RDW: 15.5 % (ref 11.5–15.5)
WBC: 13.7 10*3/uL — ABNORMAL HIGH (ref 4.0–10.5)

## 2012-11-24 LAB — BASIC METABOLIC PANEL
Calcium: 7.5 mg/dL — ABNORMAL LOW (ref 8.4–10.5)
GFR calc Af Amer: >90 mL/min (ref >90)
GFR calc non Af Amer: >90 mL/min (ref >90)
Glucose, Bld: 94 mg/dL (ref 70–99)
Potassium: 4 mEq/L (ref 3.5–5.1)
Sodium: 137 mEq/L (ref 135–145)

## 2012-11-24 LAB — PROTIME-INR
INR: 1.5 — ABNORMAL HIGH (ref 0.00–1.49)
Prothrombin Time: 17.7 seconds — ABNORMAL HIGH (ref 11.6–15.2)

## 2012-11-24 LAB — PREPARE FRESH FROZEN PLASMA

## 2012-11-24 LAB — HEPATIC FUNCTION PANEL
AST: 12 U/L (ref 0–37)
Albumin: 2.1 g/dL — ABNORMAL LOW (ref 3.5–5.2)
Bilirubin, Direct: <0.1 mg/dL (ref 0.0–0.3)

## 2012-11-24 MED ORDER — PANTOPRAZOLE SODIUM 40 MG IV SOLR
40.0000 mg | Freq: Two times a day (BID) | INTRAVENOUS | Status: DC
  Administered 2012-11-24 – 2012-11-25 (×3): 40 mg via INTRAVENOUS
  Filled 2012-11-24 (×5): qty 40

## 2012-11-24 NOTE — Progress Notes (Signed)
Subjective: Antonio Ramos is a 52 yo man with a PMH of PUD and HTN, who was admitted on 11/23/12 for upper GIB after he left AMA on 11/22/12 when he was noted to have a significant Upper GIB. His HGB was 4.3 on admission. He was initially admitted to SDU, then transferred to ICU with hemorrhagic shock. PCCM consulted. Patient underwent emergent EGD, and received 3 U PRBC and 2 U FFP. He is Hemodynamically stable.  He was transferred to SDU and back to IMTS. His HGB is 8.9 today.   Antonio Ramos was seen and examined by me this evening.  He is feeling well and is resting in bed watching t.v.  An empty dinner tray is at bedside.  He denies lightheadedness, dizziness, CP/SOB or N/V.  He is having normal bowel movements and denies blood in stool or urine.    Objective: Vital signs in last 24 hours: Filed Vitals:   11/24/12 1400 11/24/12 1500 11/24/12 1600 11/24/12 1700  BP: 134/70  136/74   Pulse: 78 74 67 65  Temp:      TempSrc:      Resp: 15 17 17 18   Height:      Weight:      SpO2: 97% 98% 100% 98%   Weight change:   Intake/Output Summary (Last 24 hours) at 11/24/12 1853 Last data filed at 11/24/12 1700  Gross per 24 hour  Intake 3563.08 ml  Output   2550 ml  Net 1013.08 ml   General: resting in bed HEENT: PERRL, EOMI, no scleral icterus Cardiac: RRR, no rubs, murmurs or gallops Pulm: clear to auscultation bilaterally, moving normal volumes of air Abd: soft, nontender, nondistended, BS present Ext: warm and well perfused, no pedal edema Neuro: alert and oriented X3, cranial nerves II-XII grossly intact  Lab Results: Basic Metabolic Panel:  Recent Labs Lab 11/23/12 1050 11/24/12 0559  NA 140 137  K 4.0 4.0  CL 109 109  CO2 22 24  GLUCOSE 117* 94  BUN 28* 20  CREATININE 1.01 0.99  CALCIUM 7.9* 7.5*   Liver Function Tests:  Recent Labs Lab 11/21/12 0905 11/24/12 0559  AST 11 12  ALT 7 7  ALKPHOS 46 27*  BILITOT 0.2* 0.4  PROT 5.3* 3.7*  ALBUMIN 2.9* 2.1*    CBC:  Recent Labs Lab 11/21/12 0905  11/23/12 1323  11/24/12 0559 11/24/12 1803  WBC 9.2  < > 18.9*  --   --  13.7*  NEUTROABS 6.8  --  17.1*  --   --   --   HGB 6.5*  < > 4.3*  < > 8.3* 8.9*  HCT 19.3*  < > 12.3*  < > 22.5* 24.8*  MCV 76.0*  < > 83.1  --   --  84.9  PLT 204  < > 141*  --   --  PENDING  < > = values in this interval not displayed. Cardiac Enzymes:  Recent Labs Lab 11/21/12 1855 11/22/12 0043  TROPONINI <0.30 <0.30   Coagulation:  Recent Labs Lab 11/21/12 0905 11/23/12 1323 11/24/12 0559  LABPROT 16.7* 19.1* 17.7*  INR 1.39 1.66* 1.50*    Micro Results: Recent Results (from the past 240 hour(s))  MRSA PCR SCREENING     Status: None   Collection Time    11/21/12  3:09 PM      Result Value Range Status   MRSA by PCR NEGATIVE  NEGATIVE Final   Comment:  The GeneXpert MRSA Assay (FDA     approved for NASAL specimens     only), is one component of a     comprehensive MRSA colonization     surveillance program. It is not     intended to diagnose MRSA     infection nor to guide or     monitor treatment for     MRSA infections.   Studies/Results: Portable Chest 1 View  11/23/2012   CLINICAL DATA:  Central line placement. Hypertension.  EXAM: PORTABLE CHEST - 1 VIEW  COMPARISON:  None.  FINDINGS: Left internal jugular central venous catheter tip: SVC. No pneumothorax.  Heart size within normal limits. Borderline prominence of the main pulmonary artery. The lungs appear clear.  IMPRESSION: 1. Left IJ line tip: SVC. No pneumothorax.   Electronically Signed   By: Herbie Baltimore   On: 11/23/2012 19:58   Medications: I have reviewed the patient's current medications. Scheduled Meds: . pantoprazole (PROTONIX) IV  40 mg Intravenous Q12H  . sodium chloride  3 mL Intravenous Q12H   Continuous Infusions:  PRN Meds:.sodium chloride, ondansetron, sodium chloride  Assessment/Plan: # Acute Upper GIB  The patient is a 52 yo man with a PMH of  PUD and HTN, who was admitted on 11/23/12 for upper GIB after he left AMA on 11/22/12 when he was noted to have a significant Upper GIB. His HGB was 4.3 on admission. He was initially admitted to SDU, then transferred to ICU with hemorrhagic shock. PCCM consulted. Patient underwent emergent EGD, and received 3 U PRBC and 2 U FFP. He is Hemodynamically stable. Transferred to SDU and back to IMTS. His HGB is 8.9 today.  His EGD showed  1. Small hiatal hernia 2. Old blood in the stomach 3. few antral erosions 4. bulb scarring from previous ulcer disease 5 few linear ulcers with white base in the bulb 6. Oozing from the C-loop junction along fold status post epi injection as above with good cessation of bleeding   # Hemorrhagic shock, resolved   # Acute blood loss anemia, better   # Consumptive/dilutional coagulopathy, improved  Dispo: Disposition is deferred at this time, awaiting improvement of current medical problems.  Anticipated discharge in approximately 1-2 day(s).   The patient does not have a current PCP (No Pcp Per Patient) and does need an [facility] hospital follow-up appointment after discharge.  The patient does not know have transportation limitations that hinder transportation to clinic appointments.  .Services Needed at time of discharge: Y = Yes, Blank = No PT:   OT:   RN:   Equipment:   Other:     LOS: 1 day   Evelena Peat, DO 11/24/2012, 6:53 PM

## 2012-11-24 NOTE — Progress Notes (Signed)
[facility] PROGRESS NOTE Subjective Patient doing well no signs of recurrent bleeding  Objective: Vital signs in last 24 hours: Temp:  [97.5 F (36.4 C)-99.3 F (37.4 C)] 98.4 F (36.9 C) (08/21 0741) Pulse Rate:  [53-109] 76 (08/21 0800) Resp:  [10-32] 14 (08/21 0800) BP: (96-165)/(44-117) 124/75 mmHg (08/21 0800) SpO2:  [92 %-100 %] 100 % (08/21 0800) Weight:  [82.2 kg (181 lb 3.5 oz)] 82.2 kg (181 lb 3.5 oz) (08/20 1739) Last BM Date: 11/23/12  Intake/Output from previous day: 08/20 0701 - 08/21 0700 In: 3288.1 [I.V.:522.1; Blood:2766] Out: 1150 [Urine:1150] Intake/Output this shift: Total I/O In: 245 [P.O.:200; I.V.:45] Out: 300 [Urine:300]  PE: General--taking clear liquid watching TV Heart-- Lungs-- Abdomen--soft and nontender  Lab Results:  Recent Labs  11/21/12 1854 11/22/12 0043 11/22/12 1135 11/23/12 1323 11/24/12 0115 11/24/12 0559  WBC 11.6* 10.3 11.2* 18.9*  --   --   HGB 7.2* 5.8* 7.0* 4.3* 8.7* 8.3*  HCT 21.3* 16.7* 19.8* 12.3* 24.0* 22.5*  PLT 151 128* 114* 141*  --   --    BMET  Recent Labs  11/23/12 1050 11/24/12 0559  NA 140 137  K 4.0 4.0  CL 109 109  CO2 22 24  CREATININE 1.01 0.99   LFT  Recent Labs  11/24/12 0559  PROT 3.7*  AST 12  ALT 7  ALKPHOS 27*  BILITOT 0.4  BILIDIR <0.1  IBILI NOT CALCULATED   PT/INR  Recent Labs  11/23/12 1323 11/24/12 0559  LABPROT 19.1* 17.7*  INR 1.66* 1.50*   PANCREAS No results found for this basename: LIPASE,  in the last 72 hours       Studies/Results: Portable Chest 1 View  11/23/2012   CLINICAL DATA:  Central line placement. Hypertension.  EXAM: PORTABLE CHEST - 1 VIEW  COMPARISON:  None.  FINDINGS: Left internal jugular central venous catheter tip: SVC. No pneumothorax.  Heart size within normal limits. Borderline prominence of the main pulmonary artery. The lungs appear clear.  IMPRESSION: 1. Left IJ line tip: SVC. No pneumothorax.   Electronically Signed    By: Herbie Baltimore   On: 11/23/2012 19:58    Medications: I have reviewed the patient's current medications.  Assessment/Plan: 1. Bleeding duodenal ulcer. Discuss with patient. Had injection of epinephrine last night. Would continue on high dose PPI therapy now. Hope we can discharge on oral PPI therapy when stable.   Paddy Neis JR,Desiree Fleming L 11/24/2012, 9:49 AM

## 2012-11-24 NOTE — Progress Notes (Signed)
PULMONARY  / CRITICAL CARE MEDICINE  Name: Antonio Ramos MRN: [id number] DOB: 1960-10-20    ADMISSION DATE:  2012/12/13 CONSULTATION DATE:  13-Dec-2012  REFERRING MD :  Antonio Ramos (IMTS) PRIMARY SERVICE: PCCM  CHIEF COMPLAINT:  GI bleed  BRIEF PATIENT DESCRIPTION: 52 y/o male with history of PUD/HTN, was admitted on 2022-12-14 with an upper GI bleed and a Hgb of 4.3.  SIGNIFICANT EVENTS / STUDIES:  12-14-2022 admission to IMTS December 14, 2022 Transferred to ICU with hemorrhagic shock. PCCM consult Dec 14, 2022 EGD [facility]): FINDINGS: 1. Small hiatal hernia 2. Old blood in the stomach 3. few antral erosions 4. bulb scarring from previous ulcer disease 5 few linear ulcers with white base in the bulb 6. Oozing from the C-loop junction along fold status post epi injection as above with good cessation of bleeding 8/21 Hemodynamically stable. Transferred to SDU and back to IMTS  LINES / TUBES: 12/14/22 L IJ CVL >> 8/22  CULTURES:   ANTIBIOTICS:   SUBJECTIVE:  No complaints. No distress. No overt bleeding  VITAL SIGNS: Temp:  [98.1 F (36.7 C)-99.3 F (37.4 C)] 98.1 F (36.7 C) (08/21 1115) Pulse Rate:  [53-109] 73 (08/21 1100) Resp:  [10-32] 17 (08/21 1100) BP: (96-165)/(44-117) 124/78 mmHg (08/21 1100) SpO2:  [92 %-100 %] 100 % (08/21 1100) Weight:  [82.2 kg (181 lb 3.5 oz)] 82.2 kg (181 lb 3.5 oz) Dec 14, 2022 1739) HEMODYNAMICS:   VENTILATOR SETTINGS:   INTAKE / OUTPUT: Intake/Output     12/14/22 0701 - 08/21 0700 08/21 0701 - 08/22 0700   P.O.  400   I.V. (mL/kg) 522.1 (6.4) 180 (2.2)   Blood 2766    Total Intake(mL/kg) 3288.1 (40) 580 (7.1)   Urine (mL/kg/hr) 1150 600 (1.5)   Total Output 1150 600   Net +2138.1 -20        Stool Occurrence 1 x    Emesis Occurrence 2 x      PHYSICAL EXAMINATION:  Gen: NAD HEENT: WNL PULM; Clear CV: RRR s M AB: soft, NT, NABS Ext: warm, no edema Neuro: cognition intact, no focal deficits  LABS:  CBC Recent Labs     11/22/12  0043  11/22/12  1135  Dec 13, 2012  1323   11/24/12  0115  11/24/12  0559  WBC  10.3  11.2*  18.9*   --    --   HGB  5.8*  7.0*  4.3*  8.7*  8.3*  HCT  16.7*  19.8*  12.3*  24.0*  22.5*  PLT  128*  114*  141*   --    --    Coag's Recent Labs     December 13, 2012  1323  11/24/12  0559  APTT  29   --   INR  1.66*  1.50*   BMET Recent Labs     2012-12-13  1050  11/24/12  0559  NA  140  137  K  4.0  4.0  CL  109  109  CO2  22  24  BUN  28*  20  CREATININE  1.01  0.99  GLUCOSE  117*  94   Electrolytes Recent Labs     12-13-12  1050  11/24/12  0559  CALCIUM  7.9*  7.5*   Sepsis Markers No results found for this basename: LACTICACIDVEN, PROCALCITON, O2SATVEN,  in the last 72 hours ABG No results found for this basename: PHART, PCO2ART, PO2ART,  in the last 72 hours Liver Enzymes Recent Labs     11/24/12  0559  AST  12  ALT  7  ALKPHOS  27*  BILITOT  0.4  ALBUMIN  2.1*   Cardiac Enzymes Recent Labs     11/21/12  1855  11/22/12  0043  TROPONINI  <0.30  <0.30   Glucose No results found for this basename: GLUCAP,  in the last 72 hours   CXR: NNF  ASSESSMENT / PLAN:  CARDIOVASCULAR A:  Hemorrhagic shock, resolved P:  IVFs adjusted Stable for transfer to SDU   GASTROINTESTINAL A: Acute Upper GI bleed> presumably recurrent PUD, ddx includes AVM, less likely mallory weiss; does not have stigmata of cirrhosis on exam Earlier intervention would have been better as we are greatly behind on resuscitation P:   Change PPI infusion to pantoprazole 40 mg IV q 12 hrs   HEMATOLOGIC A:  Acute blood loss anemia Consumptive/dilutional coagulopathy, improved P:  Recheck CBC this afternoon Transfuse for Hgb < 7.0 or acute bleeding  NEUROLOGIC A:  Somnolence, resolved P:   Monitor   Transfer to SDU and back to IMTS service. PCCM will sign off. Please call if we can be of further assistance   [name], MD ; [facility] [phone].  After 5:30 PM or weekends, call [phone]

## 2012-11-24 NOTE — Progress Notes (Signed)
Nutrition Brief Note  RD consulted for pt with GIB.   Wt Readings from Last 15 Encounters:  11/23/12 181 lb 3.5 oz (82.2 kg)  11/23/12 181 lb 3.5 oz (82.2 kg)  11/21/12 181 lb 14.1 oz (82.5 kg)    Body mass index is 23.26 kg/(m^2). Patient meets criteria for WNL based on current BMI.   Current diet order is clear liquids, patient is consuming approximately 100% of meals at this time. Labs and medications reviewed.   Pt reports that his appetite and intake were excellent prior to the 18th.  He reports dark tarry stools and has been dx with duodenal ulcer.  Pt is hungry and awaiting diet advancement.  Denies appetite loss or wt loss.  States his usual wt is 180-184 lbs.  No nutrition interventions warranted at this time. If nutrition issues arise, please consult RD.  Will monitor for diet advancement and tolerance.   Loyce Dys, MS RD LDN Clinical Inpatient Dietitian Pager: [phone] Weekend/After hours pager: [phone]

## 2012-11-24 NOTE — Progress Notes (Signed)
Utilization review completed. Aprile Dickenson, RN, BSN. 

## 2012-11-24 NOTE — Care Management Note (Signed)
    Page 1 of 1   11/24/2012     10:02:30 AM   CARE MANAGEMENT NOTE 11/24/2012  Patient:  Antonio Ramos,Antonio Ramos   Account Number:  [id number]  Date Initiated:  11/24/2012  Documentation initiated by:  Junius Creamer  Subjective/Objective Assessment:   adm gi bleed     Action/Plan:   lives w wife   Anticipated DC Date:     Anticipated DC Plan:        DC Planning Services  CM consult      Choice offered to / List presented to:             Status of service:   Medicare Important Message given?   (If response is "NO", the following Medicare IM given date fields will be blank) Date Medicare IM given:   Date Additional Medicare IM given:    Discharge Disposition:  HOME/SELF CARE  Per UR Regulation:  Reviewed for med. necessity/level of care/duration of stay  If discussed at Long Length of Stay Meetings, dates discussed:    Comments:

## 2012-11-24 NOTE — H&P (Signed)
INTERNAL MEDICINE TEACHING SERVICE Attending Admission Note  Date: 11/24/2012  Patient name: Antonio Ramos  Medical record number: [id number]  Date of birth: 03-Jan-1961    I have seen and evaluated Antonio Ramos and discussed their care with the Residency Team.  52 yr old AAM w/ hx PUD and HTN who recently left AMA after a significant UGI bleed returns with hematemesis and hypotension, hemorrhagic shock.  He was admitted to the MICU by PCCM and emergently underwent EGD. He was found to have evidence of antral erosions, old ulcer disease, and a few linear ulcers. He was also found to have oozing from C-loop junction along fold and required epi injection with cessation of bleeding. He is currently hemodynamically stable. He has required aggressive transfusion and developed dilutional coagulopathy.  Continue to follow H/H as well as Ca every 8 hours until the morning. He will need transfusion of FFP if worsening dilutional coagulopathy.  Jonah Blue, DO, FACP Faculty [facility] Internal Medicine Residency Program 11/24/2012, 1:28 PM

## 2012-11-25 LAB — PROTIME-INR
INR: 1.41 (ref 0.00–1.49)
Prothrombin Time: 16.9 seconds — ABNORMAL HIGH (ref 11.6–15.2)

## 2012-11-25 LAB — CBC
HCT: 21.9 % — ABNORMAL LOW (ref 39.0–52.0)
MCHC: 35.6 g/dL (ref 30.0–36.0)
Platelets: 100 10*3/uL — ABNORMAL LOW (ref 150–400)
RDW: 15.7 % — ABNORMAL HIGH (ref 11.5–15.5)
WBC: 10.6 10*3/uL — ABNORMAL HIGH (ref 4.0–10.5)

## 2012-11-25 LAB — BASIC METABOLIC PANEL
BUN: 12 mg/dL (ref 6–23)
Chloride: 107 mEq/L (ref 96–112)
Creatinine, Ser: 1.03 mg/dL (ref 0.50–1.35)
GFR calc Af Amer: >90 mL/min (ref >90)
GFR calc non Af Amer: 81 mL/min — ABNORMAL LOW (ref >90)

## 2012-11-25 LAB — CBC WITH DIFFERENTIAL/PLATELET
Basophils Absolute: 0 10*3/uL (ref 0.0–0.1)
Basophils Relative: 0 % (ref 0–1)
Lymphocytes Relative: 11 % — ABNORMAL LOW (ref 12–46)
MCHC: 35.7 g/dL (ref 30.0–36.0)
Monocytes Absolute: 1 10*3/uL (ref 0.1–1.0)
Neutro Abs: 8.3 10*3/uL — ABNORMAL HIGH (ref 1.7–7.7)
Neutrophils Relative %: 79 % — ABNORMAL HIGH (ref 43–77)
Platelets: 112 10*3/uL — ABNORMAL LOW (ref 150–400)
RDW: 15.6 % — ABNORMAL HIGH (ref 11.5–15.5)
WBC: 10.6 10*3/uL — ABNORMAL HIGH (ref 4.0–10.5)

## 2012-11-25 LAB — H. PYLORI ANTIBODY, IGG: H Pylori IgG: 2.55 {ISR} — ABNORMAL HIGH

## 2012-11-25 MED ORDER — DOCUSATE SODIUM 100 MG PO CAPS: 100.0000 mg | ORAL_CAPSULE | Freq: Two times a day (BID) | ORAL | Status: DC

## 2012-11-25 MED ORDER — PANTOPRAZOLE SODIUM 40 MG PO TBEC: 40.0000 mg | DELAYED_RELEASE_TABLET | Freq: Every day | ORAL | Status: DC

## 2012-11-25 MED ORDER — FERROUS SULFATE 325 (65 FE) MG PO TABS: 325.0000 mg | ORAL_TABLET | Freq: Every day | ORAL | Status: DC

## 2012-11-25 NOTE — ED Notes (Signed)
Lab tech at bedside, stuck pt 3 times, only able to get a small amount of blood, couldn't get all labs. sts he will send another phlebotomist to attempt.

## 2012-11-25 NOTE — ED Notes (Signed)
Attempted to gain IV access X 2, unsuccessful. Another RN will attempt. 

## 2012-11-25 NOTE — Progress Notes (Signed)
  Date: 11/25/2012  Patient name: Antonio Ramos  Medical record number: [id number]  Date of birth: [birth year]   This patient has been seen and the plan of care was discussed with the house staff. Please see their note for complete details. I concur with their findings with the following additions/corrections:  No further bleeding. He has maintained hemodynamic stability.  H/H repeated and stable.  Would D/C on BID PPI therapy.  He needs stool followed up for H pylori.  He will need to follow up with [place] in clinic in next week and with GI in next 6-8 weeks. Advance diet. Medically stable for D/C home.  Jonah Blue, DO, FACP Faculty [facility] Internal Medicine Residency Program 11/25/2012, 1:14 PM

## 2012-11-25 NOTE — Discharge Summary (Signed)
Name: Antonio Ramos MRN: [id number] DOB: 1961-03-29 52 y.o. PCP: No Pcp Per Patient  Date of Admission: 11/23/2012 10:38 AM Date of Discharge: 11/25/2012 Attending Physician: No att. providers found  Discharge Diagnosis: 1. Acute GI Bleed 2. PUD 3. HTN 4. Upper GI Bleed 5. Hemorrhagic Shock  Discharge Medications:   Medication List         docusate sodium 100 MG capsule  Commonly known as:  COLACE  Take 1 capsule (100 mg total) by mouth 2 (two) times daily.     ferrous sulfate 325 (65 FE) MG tablet  Take 1 tablet (325 mg total) by mouth daily with breakfast.     pantoprazole 40 MG tablet  Commonly known as:  PROTONIX  Take 1 tablet (40 mg total) by mouth daily.        Disposition and follow-up:   Antonio Ramos was discharged from [facility] in Good condition.  At the hospital follow up visit please address:  1. Upper GI Bleed, Anemia, HTN, PUD, GERD, H Pylori came back positive after patient was discharged (needs triple therapy). I was unable to contact the patient via phone as his numbers were disconnected.  2.  Labs / imaging needed at time of follow-up: CBC, BMP  3.  Pending labs/ test needing follow-up: H Pylori  Follow-up Appointments:     Follow-up Information   Follow up with Ky Barban, MD On 12/01/2012. (10:30 am)    Specialty:  Internal Medicine   Contact information:   [address] [place] [address] [phone]       Discharge Instructions: Discharge Orders   Future Appointments Provider Department Dept Phone   12/01/2012 10:30 AM Ky Barban, MD [facility] [phone]   Future Orders Complete By Expires   Call MD for:  extreme fatigue  As directed    Call MD for:  persistant dizziness or light-headedness  As directed    Call MD for:  persistant nausea and vomiting  As directed    Call MD for:  As directed    Comments:     Blood in vomit or stool.   Diet - low sodium  heart healthy  As directed    Discharge instructions  As directed    Comments:     You have been diagnosed with a GI bleed. Your blood counts were extremely low. You were transfused with blood. The bleeding has currently stopped. If it returns, please return to the ED this is a life threatening condition.  We have prescribed you pantoprazole 40 mg daily. This medicine decreases the acid in the stomach in order to help prevent future bleeding and promote current healing.   Increase activity slowly  As directed       Consultations: Treatment Team:  Vertell Novak., MD  Procedures Performed:  Portable Chest 1 View  11/23/2012   CLINICAL DATA:  Central line placement. Hypertension.  EXAM: PORTABLE CHEST - 1 VIEW  COMPARISON:  None.  FINDINGS: Left internal jugular central venous catheter tip: SVC. No pneumothorax.  Heart size within normal limits. Borderline prominence of the main pulmonary artery. The lungs appear clear.  IMPRESSION: 1. Left IJ line tip: SVC. No pneumothorax.   Electronically Signed   By: Herbie Baltimore   On: 11/23/2012 19:58      Admission HPI: The patient is a 52 yo man with a PMH of PUD and HTN, who presents with bright red blood hematemesis  and dark tarry stool per rectum. Patient reports being in court and suddenly "throwing up blood." Associated symptoms include lightheadedness and dizziness. Denies chest pain, chest pressure, or abdominal pain. He presented to ED after vomiting blood.  Of note, he was admitted to the hospital 2 days ago on 11/21/12 for upper GI bleeding. He reports eating a spicy meal 3 days prior to the previous admission and drinking 1 beer/week (last drink 3 days ago). But denies NSAIDs or steroid usage. And He was given 2 units PRBCs, which raised his HGB from 6.5 to 7.0. However, patient refused EGD and left the hospital AMA on 11/22/12.    Hospital Course by problem list: Principal Problem:   Acute GI bleeding Active Problems:   PUD (peptic  ulcer disease)   Hypertension   Upper GI bleed   Hemorrhagic shock   1. Acute Upper GI bleeding with Hemorrhagic shock most likely due to PUD  52 yr old African American Male with hx of PUD and HTN who recently left AMA after a significant UGI bleed returns with hematemesis and hypotension, hemorrhagic shock with Hgb 4.3 most likely due to peptic ulcer disease. He was admitted to the MICU by PCCM and emergently underwent EGD. Over the last few days he had received 10 U of PRBC and 2 U FFP. He was found to have evidence of antral erosions, old ulcer disease, and a few linear ulcers. He was also found to have oozing from C-loop junction along fold and required epi injection with cessation of bleeding. After the procedure he was hemodynamically stable and the next day he started feeling well with no symptoms of lightheadedness, dizziness, SOB, Nausea, vomiting , diarrhea and tolerating feeds well. He is discharged with BID PPI therapy and follow up with GI after discharge And outpatient clinic with H.Pylori antibody IgG, CBC   2. Hypertension  Hold on Home medications now since he is normotensive. Will restart medications with the first follow up visit at the out patient clinic as indicated  3. Anemia I started the patient on iron and colace on discharge.   Discharge Vitals:   BP 126/62  Pulse 68  Temp(Src) 98 F (36.7 C) (Oral)  Resp 16  Ht 6\' 2"  (1.88 m)  Wt 181 lb 3.5 oz (82.2 kg)  BMI 23.26 kg/m2  SpO2 96%  Discharge Labs:  Results for orders placed during the hospital encounter of 11/23/12 (from the past 24 hour(s))  CBC     Status: Abnormal   Collection Time    11/24/12  6:03 PM      Result Value Range   WBC 13.7 (*) 4.0 - 10.5 K/uL   RBC 2.92 (*) 4.22 - 5.81 MIL/uL   Hemoglobin 8.9 (*) 13.0 - 17.0 g/dL   HCT 16.1 (*) 09.6 - 04.5 %   MCV 84.9  78.0 - 100.0 fL   MCH 30.5  26.0 - 34.0 pg   MCHC 35.9  30.0 - 36.0 g/dL   RDW 40.9  81.1 - 91.4 %   Platelets 95 (*) 150 - 400 K/uL                    BASIC METABOLIC PANEL     Status: Abnormal   Collection Time    11/25/12  3:49 AM      Result Value Range   Sodium 137  135 - 145 mEq/L   Potassium 3.8  3.5 - 5.1 mEq/L   Chloride 107  96 -  112 mEq/L   CO2 26  19 - 32 mEq/L   Glucose, Bld 90  70 - 99 mg/dL   BUN 12  6 - 23 mg/dL   Creatinine, Ser 1.61  0.50 - 1.35 mg/dL   Calcium 7.8 (*) 8.4 - 10.5 mg/dL   GFR calc non Af Amer 81 (*) >90 mL/min   GFR calc Af Amer >90  >90 mL/min  CBC     Status: Abnormal   Collection Time    11/25/12  3:49 AM      Result Value Range   WBC 10.6 (*) 4.0 - 10.5 K/uL   RBC 2.55 (*) 4.22 - 5.81 MIL/uL   Hemoglobin 7.8 (*) 13.0 - 17.0 g/dL   HCT 09.6 (*) 04.5 - 40.9 %   MCV 85.9  78.0 - 100.0 fL   MCH 30.6  26.0 - 34.0 pg   MCHC 35.6  30.0 - 36.0 g/dL   RDW 81.1 (*) 91.4 - 78.2 %   Platelets 100 (*) 150 - 400 K/uL  PROTIME-INR     Status: Abnormal   Collection Time    11/25/12  3:49 AM      Result Value Range   Prothrombin Time 16.9 (*) 11.6 - 15.2 seconds   INR 1.41  0.00 - 1.49  CBC WITH DIFFERENTIAL     Status: Abnormal   Collection Time    11/25/12  8:38 AM      Result Value Range   WBC 10.6 (*) 4.0 - 10.5 K/uL   RBC 2.74 (*) 4.22 - 5.81 MIL/uL   Hemoglobin 8.4 (*) 13.0 - 17.0 g/dL   HCT 95.6 (*) 21.3 - 08.6 %   MCV 85.8  78.0 - 100.0 fL   MCH 30.7  26.0 - 34.0 pg   MCHC 35.7  30.0 - 36.0 g/dL   RDW 57.8 (*) 46.9 - 62.9 %   Platelets 112 (*) 150 - 400 K/uL   Neutrophils Relative % 79 (*) 43 - 77 %   Neutro Abs 8.3 (*) 1.7 - 7.7 K/uL   Lymphocytes Relative 11 (*) 12 - 46 %   Lymphs Abs 1.1  0.7 - 4.0 K/uL   Monocytes Relative 9  3 - 12 %   Monocytes Absolute 1.0  0.1 - 1.0 K/uL   Eosinophils Relative 1  0 - 5 %   Eosinophils Absolute 0.1  0.0 - 0.7 K/uL   Basophils Relative 0  0 - 1 %   Basophils Absolute 0.0  0.0 - 0.1 K/uL    Signed: Pleas Koch, MD 11/25/2012, 4:38 PM   Time Spent on Discharge: 30 minutes Services Ordered on Discharge:  None Equipment Ordered on Discharge: None

## 2012-11-25 NOTE — Discharge Summary (Signed)
Name: Antonio Ramos MRN: [id number] DOB: Jul 03, 1960 52 y.o. PCP: No Pcp Per Patient  Date of Admission: 11/23/2012 10:38 AM Date of Discharge: 11/25/2012 Attending Physician: Jonah Blue, DO  Discharge Diagnosis: Principal Problem:   Acute GI bleeding Active Problems:   PUD (peptic ulcer disease)   Hypertension   Upper GI bleed   Hemorrhagic shock  Discharge Medications:   Medication List    Notice   You have not been prescribed any medications.      Disposition and follow-up:   Antonio Ramos was discharged from [facility] in Stable condition.  At the hospital follow up visit please address:  1. His Hgb levels  2.  Labs / imaging needed at time of follow-up: CBC, Iron panel, stool followed up for H pylori.  3.  Pending labs/ test needing follow-up: H.Pylori serum antigen  Follow-up Appointments: Tried to schedule appointment with gastroenterologist with Dr. Samuel Germany L but his office wants to speak to him directly after discharge to schedule follow up in 6-8 weeks  Follow-up Information   Follow up with Ky Barban, MD On 12/01/2012. (10:30 am)    Specialty:  Internal Medicine   Contact information:   [address] [place] [address] [phone]      Discharge Instructions:   Consultations: Treatment Team:  Vertell Novak., MD  Procedures Performed:  Portable Chest 1 View  11/23/2012   CLINICAL DATA:  Central line placement. Hypertension.  EXAM: PORTABLE CHEST - 1 VIEW  COMPARISON:  None.  FINDINGS: Left internal jugular central venous catheter tip: SVC. No pneumothorax.  Heart size within normal limits. Borderline prominence of the main pulmonary artery. The lungs appear clear.  IMPRESSION: 1. Left IJ line tip: SVC. No pneumothorax.   Electronically Signed   By: Herbie Baltimore   On: 11/23/2012 19:58    Admission HPI:  The patient is a 52 yo man with a PMH of PUD and HTN, who presents with bright red blood  hematemesis and dark tarry stool per rectum. Patient reports being in court and suddenly "throwing up blood." Associated symptoms include lightheadedness and dizziness. Denies chest pain, chest pressure, or abdominal pain. He presented to ED after vomiting blood.  Of note, he was admitted to the hospital 2 days ago on 11/21/12 for upper GI bleeding. He reports eating a spicy meal 3 days prior to the previous admission and drinking 1 beer/week (last drink 3 days ago). But denies NSAIDs or steroid usage. And He was given 2 units PRBCs, which raised his HGB from 6.5 to 7.0. However, patient refused EGD and left the hospital AMA on 11/22/12.   Review of Systems:  General: no fevers, chills, changes in weight, changes in appetite  Skin: no rash  HEENT: no blurry vision, hearing changes, sore throat  Pulm: no dyspnea, coughing, wheezing  CV: no chest pain, palpitations, shortness of breath  Abd: no abdominal pain, nausea/vomiting +, diarrhea/constipation, melena +  GU: no dysuria, hematuria, polyuria  Ext: no arthralgias, myalgias  Neuro: no weakness, numbness, or tingling   Physical Exam:  Blood pressure 134/67, pulse 105, temperature 98.1 F (36.7 C), temperature source Oral, resp. rate 16, height 6\' 2"  (1.88 m), weight 82.2 kg (181 lb 3.5 oz), SpO2 100.00%.  General: mild distress due to blood loss  HEENT: pupils equal round and reactive to light, vision grossly intact, oropharynx clear and non-erythematous  Neck: supple, no lymphadenopathy Lungs: clear to auscultation bilaterally, normal work  of respiration, no wheezes, rales, ronchi Heart: regular rate and rhythm, systolic murmurs + on the mitral area, no gallops, or rubs Abdomen: soft, non-tender, non-distended, normal bowel sounds  Extremities: no cyanosis, clubbing +, No edema Neurologic: alert & oriented X3, cranial nerves II-XII intact, strength grossly intact, sensation intact to light touch  Hospital Course by problem list:   1. Acute  Upper GI bleeding with Hemorrhagic shock most likely due to PUD  52 yr old African American Male with hx of PUD and HTN who recently left AMA after a significant UGI bleed returns with hematemesis and hypotension, hemorrhagic shock with Hgb 4.3 most likely due to peptic ulcer disease. He was admitted to the MICU by PCCM and emergently underwent EGD and 3 U PRBC and 2 U FFP. He was found to have evidence of antral erosions, old ulcer disease, and a few linear ulcers. He was also found to have oozing from C-loop junction along fold and required epi injection with cessation of bleeding. After the procedure he was hemodynamically stable and the next day he started feeling well with no symptoms of lightheadedness, dizziness, SOB, Nausea, vomiting , diarrhea and tolerating feeds well. He is discharged with BID PPI therapy and follow up with GI after discharge  And outpatient clinic with H.Pylori antibody IgG, CBC  2. Hypertension  Hold on Home medications now since he is normotensive. Will restart medications with the first follow up visit at the out patient clinic.   Discharge Vitals:   BP 118/65   Pulse 67   Temp(Src) 98 F (36.7 C) (Oral)   Resp 18   Ht 6\' 2"  (1.88 m)   Wt 82.2 kg (181 lb 3.5 oz)   BMI 23.26 kg/m2   SpO2 98%  Discharge Labs:  Results for orders placed during the hospital encounter of 11/23/12 (from the past 24 hour(s))  CBC     Status: Abnormal   Collection Time    11/24/12  6:03 PM      Result Value Range   WBC 13.7 (*) 4.0 - 10.5 K/uL   RBC 2.92 (*) 4.22 - 5.81 MIL/uL   Hemoglobin 8.9 (*) 13.0 - 17.0 g/dL   HCT 16.1 (*) 09.6 - 04.5 %   MCV 84.9  78.0 - 100.0 fL   MCH 30.5  26.0 - 34.0 pg   MCHC 35.9  30.0 - 36.0 g/dL   RDW 40.9  81.1 - 91.4 %   Platelets 95 (*) 150 - 400 K/uL  BASIC METABOLIC PANEL     Status: Abnormal   Collection Time    11/25/12  3:49 AM      Result Value Range   Sodium 137  135 - 145 mEq/L   Potassium 3.8  3.5 - 5.1 mEq/L   Chloride 107  96 - 112  mEq/L   CO2 26  19 - 32 mEq/L   Glucose, Bld 90  70 - 99 mg/dL   BUN 12  6 - 23 mg/dL   Creatinine, Ser 7.82  0.50 - 1.35 mg/dL   Calcium 7.8 (*) 8.4 - 10.5 mg/dL   GFR calc non Af Amer 81 (*) >90 mL/min   GFR calc Af Amer >90  >90 mL/min  CBC     Status: Abnormal   Collection Time    11/25/12  3:49 AM      Result Value Range   WBC 10.6 (*) 4.0 - 10.5 K/uL   RBC 2.55 (*) 4.22 - 5.81 MIL/uL  Hemoglobin 7.8 (*) 13.0 - 17.0 g/dL   HCT 16.1 (*) 09.6 - 04.5 %   MCV 85.9  78.0 - 100.0 fL   MCH 30.6  26.0 - 34.0 pg   MCHC 35.6  30.0 - 36.0 g/dL   RDW 40.9 (*) 81.1 - 91.4 %   Platelets 100 (*) 150 - 400 K/uL  PROTIME-INR     Status: Abnormal   Collection Time    11/25/12  3:49 AM      Result Value Range   Prothrombin Time 16.9 (*) 11.6 - 15.2 seconds   INR 1.41  0.00 - 1.49  CBC WITH DIFFERENTIAL     Status: Abnormal   Collection Time    11/25/12  8:38 AM      Result Value Range   WBC 10.6 (*) 4.0 - 10.5 K/uL   RBC 2.74 (*) 4.22 - 5.81 MIL/uL   Hemoglobin 8.4 (*) 13.0 - 17.0 g/dL   HCT 78.2 (*) 95.6 - 21.3 %   MCV 85.8  78.0 - 100.0 fL   MCH 30.7  26.0 - 34.0 pg   MCHC 35.7  30.0 - 36.0 g/dL   RDW 08.6 (*) 57.8 - 46.9 %   Platelets 112 (*) 150 - 400 K/uL   Neutrophils Relative % 79 (*) 43 - 77 %   Neutro Abs 8.3 (*) 1.7 - 7.7 K/uL   Lymphocytes Relative 11 (*) 12 - 46 %   Lymphs Abs 1.1  0.7 - 4.0 K/uL   Monocytes Relative 9  3 - 12 %   Monocytes Absolute 1.0  0.1 - 1.0 K/uL   Eosinophils Relative 1  0 - 5 %   Eosinophils Absolute 0.1  0.0 - 0.7 K/uL   Basophils Relative 0  0 - 1 %   Basophils Absolute 0.0  0.0 - 0.1 K/uL    Signed: Nijas Nazar  11/25/2012, 12:36 PM   Time Spent on Discharge: >30 minutes Services Ordered on Discharge: None Equipment Ordered on Discharge: None

## 2012-11-25 NOTE — Progress Notes (Signed)
[facility] PROGRESS NOTE Subjective Pt w/o further bleeding.  Objective: Vital signs in last 24 hours: Temp:  [97.4 F (36.3 C)-99.1 F (37.3 C)] 97.4 F (36.3 C) (08/22 0755) Pulse Rate:  [64-84] 64 (08/22 0900) Resp:  [15-26] 16 (08/22 0900) BP: (91-144)/(35-81) 114/60 mmHg (08/22 0900) SpO2:  [97 %-100 %] 99 % (08/22 0900) Last BM Date: 11/23/12  Intake/Output from previous day: 08/21 0701 - 08/22 0700 In: 885 [P.O.:650; I.V.:235] Out: 3000 [Urine:3000] Intake/Output this shift: Total I/O In: 3 [I.V.:3] Out: 600 [Urine:600]  PE: General-- Heart-- Lungs-- Abdomen--nontender  Lab Results:  Recent Labs  11/22/12 1135 11/23/12 1323 11/24/12 0115 11/24/12 0559 11/24/12 1803 11/25/12 0349 11/25/12 0838  WBC 11.2* 18.9*  --   --  13.7* 10.6* 10.6*  HGB 7.0* 4.3* 8.7* 8.3* 8.9* 7.8* 8.4*  HCT 19.8* 12.3* 24.0* 22.5* 24.8* 21.9* 23.5*  PLT 114* 141*  --   --  95* 100* 112*   BMET  Recent Labs  11/23/12 1050 11/24/12 0559 11/25/12 0349  NA 140 137 137  K 4.0 4.0 3.8  CL 109 109 107  CO2 22 24 26   CREATININE 1.01 0.99 1.03   LFT  Recent Labs  11/24/12 0559  PROT 3.7*  AST 12  ALT 7  ALKPHOS 27*  BILITOT 0.4  BILIDIR <0.1  IBILI NOT CALCULATED   PT/INR  Recent Labs  11/23/12 1323 11/24/12 0559 11/25/12 0349  LABPROT 19.1* 17.7* 16.9*  INR 1.66* 1.50* 1.41   PANCREAS No results found for this basename: LIPASE,  in the last 72 hours       Studies/Results: Portable Chest 1 View  11/23/2012   CLINICAL DATA:  Central line placement. Hypertension.  EXAM: PORTABLE CHEST - 1 VIEW  COMPARISON:  None.  FINDINGS: Left internal jugular central venous catheter tip: SVC. No pneumothorax.  Heart size within normal limits. Borderline prominence of the main pulmonary artery. The lungs appear clear.  IMPRESSION: 1. Left IJ line tip: SVC. No pneumothorax.   Electronically Signed   By: Herbie Baltimore   On: 11/23/2012 19:58     Medications: I have reviewed the patient's current medications.  Assessment/Plan: 1. Bleeding DU. Stable for 48 hrs. Would advance his diet and D/C home when stable . Should be ok with Protonix 40 mg daily. Will see back in office in 6-8 weeks.   Chaka Jefferys JR,Donya Hitch L 11/25/2012, 10:04 AM

## 2012-11-25 NOTE — ED Notes (Signed)
MD made lab tech unable to get blood.

## 2012-11-25 NOTE — Progress Notes (Signed)
Subjective:  Overnight the patient's hemoglobin dropped from 8.9 to 7.8, but then rebounded to 8.4. Patient continues to have borderline low normal BP. The patient denies feeling dizzy, light-headed, weak. He is tolerating food without problems.   Objective: Vital signs in last 24 hours: Filed Vitals:   11/24/12 2000 11/24/12 2355 11/25/12 0000 11/25/12 0400  BP:   91/35 106/57  Pulse:   68 70  Temp: 98.3 F (36.8 C) 99.1 F (37.3 C)  98 F (36.7 C)  TempSrc: Oral Oral  Oral  Resp:   15 26  Height:      Weight:      SpO2:   97% 97%   Weight change:   Intake/Output Summary (Last 24 hours) at 11/25/12 0700 Last data filed at 11/25/12 0231  Gross per 24 hour  Intake    885 ml  Output   3000 ml  Net  -2115 ml  General: alert, cooperative, and in no apparent distress HEENT: pupils equal round and reactive to light, vision grossly intact, oropharynx clear and non-erythematous  Neck: supple, no lymphadenopathy Lungs: clear to ascultation bilaterally, normal work of respiration, no wheezes, rales, ronchi Heart: regular rate and rhythm, no murmurs, gallops, or rubs Abdomen: soft, non-tender, non-distended, normal bowel sounds  Extremities: no cyanosis, or edema Neurologic: alert & oriented X3, strength grossly intact, sensation intact to light touch  Lab Results: Basic Metabolic Panel:  Recent Labs Lab 11/24/12 0559 11/25/12 0349  NA 137 137  K 4.0 3.8  CL 109 107  CO2 24 26  GLUCOSE 94 90  BUN 20 12  CREATININE 0.99 1.03  CALCIUM 7.5* 7.8*   Liver Function Tests:  Recent Labs Lab 11/21/12 0905 11/24/12 0559  AST 11 12  ALT 7 7  ALKPHOS 46 27*  BILITOT 0.2* 0.4  PROT 5.3* 3.7*  ALBUMIN 2.9* 2.1*  CBC:  Recent Labs Lab 11/21/12 0905  11/23/12 1323  11/24/12 1803 11/25/12 0349  WBC 9.2  < > 18.9*  --  13.7* 10.6*  NEUTROABS 6.8  --  17.1*  --   --   --   HGB 6.5*  < > 4.3*  < > 8.9* 7.8*  HCT 19.3*  < > 12.3*  < > 24.8* 21.9*  MCV 76.0*  < > 83.1   --  84.9 85.9  PLT 204  < > 141*  --  95* 100*  < > = values in this interval not displayed. Cardiac Enzymes:  Recent Labs Lab 11/21/12 1855 11/22/12 0043  TROPONINI <0.30 <0.30  Coagulation:  Recent Labs Lab 11/21/12 0905 11/23/12 1323 11/24/12 0559 11/25/12 0349  LABPROT 16.7* 19.1* 17.7* 16.9*  INR 1.39 1.66* 1.50* 1.41     Micro Results: Recent Results (from the past 240 hour(s))  MRSA PCR SCREENING     Status: None   Collection Time    11/21/12  3:09 PM      Result Value Range Status   MRSA by PCR NEGATIVE  NEGATIVE Final   Comment:            The GeneXpert MRSA Assay (FDA     approved for NASAL specimens     only), is one component of a     comprehensive MRSA colonization     surveillance program. It is not     intended to diagnose MRSA     infection nor to guide or     monitor treatment for     MRSA infections.  Studies/Results: Portable Chest 1 View  11/23/2012   CLINICAL DATA:  Central line placement. Hypertension.  EXAM: PORTABLE CHEST - 1 VIEW  COMPARISON:  None.  FINDINGS: Left internal jugular central venous catheter tip: SVC. No pneumothorax.  Heart size within normal limits. Borderline prominence of the main pulmonary artery. The lungs appear clear.  IMPRESSION: 1. Left IJ line tip: SVC. No pneumothorax.   Electronically Signed   By: Herbie Baltimore   On: 11/23/2012 19:58   Medications: I have reviewed the patient's current medications. Scheduled Meds: . pantoprazole (PROTONIX) IV  40 mg Intravenous Q12H  . sodium chloride  3 mL Intravenous Q12H   Continuous Infusions:   PRN Meds:.sodium chloride, ondansetron, sodium chloride Assessment/Plan: Principal Problem:   Acute GI bleeding Active Problems:   PUD (peptic ulcer disease)   Hypertension   Upper GI bleed   Hemorrhagic shock  Assessment & Plan by Problem:  The patient is a 52 yo man, history of PUD, presenting with an acute GI bleed   # Acute GI bleed c/b hemorrhagic shock(shock has  now resolved) - The patient presents with bloody vomitus, as well as both bright and dark red painless blood per rectum, with Hb = 6.5 (previously 8.7 on 10/20/12). Differential includes bleeding PUD (history of same, though no significant recent risk factors for GI bleed. Uncertain H pylori status). Less likely diverticulosis (given dark blood per rectum).  The patient left AMA, but returned the next morning after having more bloody emesis. The patient had an EGD in the ICU where he was found to have a oozing duodenal area that was injected with epinephrine and saline. There was a large volume of blood in the stomach and duodenum. - protonix IV BID - NPO  - Consider second EGD if hg continues to drop - cbc q6hrs   # EKG abnormality - TWI in inferolateral leads. Pt with no chest pain. He states he had a "mini" heart attack 10 years ago, which could explain these findings. Since we have no old EKG for comparison, will cycle trop x3.  -cycle trop x3 (all negative)   # HTN - pt reports history of HTN, but not on any home BP medications, and BP borderline low-normal this admission (in setting of acute GI bleed)  -monitor for now. Will not start treatment now, due to acute GI bleed and concern for hypoTN   Dispo: Disposition is deferred at this time, awaiting improvement of current medical problems.  Anticipated discharge in approximately 2-3 day(s).   The patient does not have a current PCP (No Pcp Per Patient) and does need an [facility] hospital follow-up appointment after discharge.  The patient does not have transportation limitations that hinder transportation to clinic appointments.  .Services Needed at time of discharge: Y = Yes, Blank = No PT:   OT:   RN:   Equipment:   Other:     LOS: 2 days   Pleas Koch, MD 11/25/2012, 7:00 AM

## 2012-11-25 NOTE — Discharge Summary (Signed)
  I have seen and examined the patient myself, and I have reviewed the note by Cecille Aver, MS IV and was present during the interview and physical exam.  Please see my separate H&P for additional findings, assessment, and plan.   Signed: Pleas Koch, MD 11/25/2012, 5:14 PM

## 2012-11-25 NOTE — Progress Notes (Signed)
Pt discharged home per MD order.  Discharge instructions, follow up appointments, medications, and resources discussed.  Pt verbalized understanding.  Appropriate discharge paperwork signed by patient and RN, 1 copy given to patient, one copy placed in patient's chart.    Dan Maker, RN, BSN

## 2012-11-27 LAB — TYPE AND SCREEN
ABO/RH(D): O POS
Unit division: 0
Unit division: 0
Unit division: 0

## 2012-11-27 NOTE — ED Provider Notes (Signed)
Medical screening examination/treatment/procedure(s) were conducted as a shared visit with non-physician practitioner(s) and myself.  I personally evaluated the patient during the encounter  Recent admit for GI bleed, left AMA. Now with reported hematemesis. No vomiting in the ED. Mental status normal. HR 90s, BP 100-110 systolic. Abdomen soft and nontender.  Large bore IV Access x2, IVF, type and cross, PPI, transfuse. D/w GI. Patient refused EGD during previous hospitalization and states he will "think about it" this time.  CRITICAL CARE Performed by: Glynn Octave Total critical care time: 30 Critical care time was exclusive of separately billable procedures and treating other patients. Critical care was necessary to treat or prevent imminent or life-threatening deterioration. Critical care was time spent personally by me on the following activities: development of treatment plan with patient and/or surrogate as well as nursing, discussions with consultants, evaluation of patient's response to treatment, examination of patient, obtaining history from patient or surrogate, ordering and performing treatments and interventions, ordering and review of laboratory studies, ordering and review of radiographic studies, pulse oximetry and re-evaluation of patient's condition.   Glynn Octave, MD 11/27/12 [phone]

## 2012-11-28 ENCOUNTER — Encounter: Payer: Self-pay | Admitting: Internal Medicine

## 2012-11-28 ENCOUNTER — Ambulatory Visit (INDEPENDENT_AMBULATORY_CARE_PROVIDER_SITE_OTHER): Payer: BC Managed Care – PPO | Admitting: Internal Medicine

## 2012-11-28 VITALS — BP 140/80 | HR 70 | Temp 98.4°F | Ht 74.0 in | Wt 188.9 lb

## 2012-11-28 DIAGNOSIS — R7689 Other specified abnormal immunological findings in serum: Secondary | ICD-10-CM

## 2012-11-28 DIAGNOSIS — R768 Other specified abnormal immunological findings in serum: Secondary | ICD-10-CM | POA: Insufficient documentation

## 2012-11-28 DIAGNOSIS — K922 Gastrointestinal hemorrhage, unspecified: Secondary | ICD-10-CM

## 2012-11-28 DIAGNOSIS — K279 Peptic ulcer, site unspecified, unspecified as acute or chronic, without hemorrhage or perforation: Secondary | ICD-10-CM

## 2012-11-28 DIAGNOSIS — R894 Abnormal immunological findings in specimens from other organs, systems and tissues: Secondary | ICD-10-CM

## 2012-11-28 MED ORDER — PANTOPRAZOLE SODIUM 40 MG PO TBEC: 40.0000 mg | DELAYED_RELEASE_TABLET | Freq: Every day | ORAL | Status: DC

## 2012-11-28 MED ORDER — BISMUTH SUBSALICYLATE 262 MG/15ML PO SUSP: 15.0000 mL | Freq: Four times a day (QID) | ORAL | Status: DC | PRN

## 2012-11-28 MED ORDER — METRONIDAZOLE 250 MG PO TABS: 250.0000 mg | ORAL_TABLET | Freq: Four times a day (QID) | ORAL | Status: DC

## 2012-11-28 MED ORDER — TETRACYCLINE HCL 500 MG PO CAPS: 500.0000 mg | ORAL_CAPSULE | Freq: Four times a day (QID) | ORAL | Status: DC

## 2012-11-28 NOTE — Patient Instructions (Addendum)
Please continue taking your medication as before   I will prescribe antibiotics to treat your infection which cause ulcer   I have made an appointment with Dr Ewing Schlein on 12/13/2011 at 2:15pm. Please keep this appointment  Address: [address] [place], [place], [place] [address] [phone]  I will call you with the results from your blood and stool exam  Please take your medications as prescribed    Helicobacter Pylori and Ulcer Disease An ulcer may be in your stomach (gastric ulcer) or in the first part of your small bowel, which is called the duodenum (duodenal ulcer). An ulcer is a break in the stomach or duodenum lining. The break wears down into the deeper tissue. Helicobacter pylori (H. pylori) is a type of germ (bacteria) that may cause the majority of gastric or duodenal ulcers. CAUSES   A germ (bacterium). H. pylori can weaken the protective mucous coating of the stomach and duodenum. This allows acid to get through to the sensitive lining of the stomach or duodenum and an ulcer can then form.  Certain medications.  Using substances that can bother the lining of the stomach (alcohol, tobacco or medications such as Advil or Motrin) in the presence of H.pylori infection. This can increase the chances of getting an ulcer.  Cancer (rarely). Most people infected with H. pylori do not get ulcers. It is not known how people catch H. pylori. It may be through food or water. H. pylori has been found in the saliva of some infected people. Therefore, the bacteria may also spread through mouth-to-mouth contact such as kissing. SYMPTOMS  The problems (symptoms) of ulcer disease are usually:  A burning or gnawing of the mid-upper belly (abdomen). This is often worse on an empty stomach. It may get better with food. This may be associated with feeling sick to your stomach (nausea), bloating and vomiting.  If the ulcer results in bleeding, it can cause:  Black, tarry stools.  Throwing up bright  red blood.  Throwing up coffee ground looking materials. With severe bleeding, there may be loss of consciousness and shock. Besides ulcer disease, H. pylori can also cause chronic gastritis (irritation of the lining of the stomach without ulcer) or stomach acid-type discomfort. You may not have symptoms even though you have an H. pylori infection. Although this is an infection, you may not have usual infection symptoms (such as fever). DIAGNOSIS  Ulcer disease can be diagnosed in many different ways. If you have an ulcer, it is important to know whether or not it is caused by H. Pylori. Treatment for an ulcer caused by H. pylori is different from that for an ulcer with other causes. The best way to detect H. pylori is taking tissue directly from the ulcer during an endoscopy test.   An endoscopy is an exam that uses an endoscope. This is a thin, lighted tube with a small camera on the end. It is like a flexible telescope. The patient is given a drug to make them calm (sedative). The caregiver eases the endoscope into the mouth and down the throat to the stomach and duodenum. This allows the doctor to see the lining of the esophagus, stomach and duodenum.  If an endoscopy is not needed, then H. pylori can be detected with tests of the blood, stool or even breath. TREATMENT   H. pylori peptic ulcer treatment usually involves a combination of:  Medicines that kill germs (antibiotics).  Acid suppressors.  Stomach protectors.  The use of  only one medication to treat H. pylori is not recommended. The most proven treatment is a 2 week course of treatment called triple therapy. It involves taking two antibiotics to kill the bacteria and either an acid suppressor or stomach-lining shield. Two-week triple therapy reduces ulcer symptoms, kills the bacteria, and prevents ulcers from coming back in many patients.  Unfortunately, patients may find triple therapy hard to do. This is because it involves  taking as many as 20 pills a day. Also, the antibiotics used in triple therapy may cause mild side effects. These include nausea, vomiting, diarrhea, dark stools, a metallic taste in the mouth, dizziness, headache and yeast infections in women. Talk to your caregiver if you have any of these side effects. HOME CARE INSTRUCTIONS   Take your medications as directed and for as long as prescribed. Contact your caregiver if you have problems or side effects from your medications.  Continue regular work and usual activities unless told otherwise by your caregiver.  Avoid tobacco, alcohol and caffeine. Tobacco use will decrease and slow healing.  Avoid medications that are harmful. This includes aspirin and NSAIDS such as ibuprofen and naproxen.  Avoid foods that seem to aggravate or cause discomfort.  There are many over-the-counter products available to control stomach acid and other symptoms. Discuss these with your caregiver before using them. Do not  stop taking prescription medications for over-the-counter medications without talking with your caregiver.  Special diets are not usually needed.  Keep any follow-up appointments and blood tests as directed. SEEK MEDICAL CARE IF:   Your pain or other ulcer symptoms do not improve within a few days of starting treatment.  You develop diarrhea. This can be a problem related to certain treatments.  You have ongoing indigestion or heartburn even if your main ulcer symptoms are improved.  You think you have any side effects from your medications or if you do not understand how to use your medications right. SEEK IMMEDIATE MEDICAL CARE IF:  Any of the following happen:  You develop bright red, rectal bleeding.  You develop dark black, tarry stools.  You throw up (vomit) blood.  You become light-headed, weak, have fainting episodes, or become sweaty, cold and clammy.  You have severe abdominal pain not controlled by medications. Do not take  pain medications unless ordered by your caregiver. MAKE SURE YOU:   Understand these instructions.  Will watch your condition.  Will get help right away if you are not doing well or get worse. Document Released: 06/13/2003 Document Revised: 06/15/2011 Document Reviewed: 11/10/2007 [facility] Patient Information 2013 [place], [place].

## 2012-11-28 NOTE — Discharge Summary (Signed)
  Date: 11/28/2012  Patient name: Antonio Ramos  Medical record number: [id number]  Date of birth: 1960-04-16   This patient has been seen and the plan of care was discussed with the house staff. Please see their note for complete details. I concur with their findings and plan.  Jonah Blue, DO, FACP [facility] Medicine Residency [facility] 11/28/2012, 1:37 PM

## 2012-11-29 LAB — CBC
HCT: 25.3 % — ABNORMAL LOW (ref 39.0–52.0)
Hemoglobin: 8.1 g/dL — ABNORMAL LOW (ref 13.0–17.0)
MCHC: 32 g/dL (ref 30.0–36.0)
WBC: 12.1 10*3/uL — ABNORMAL HIGH (ref 4.0–10.5)

## 2012-11-29 NOTE — Progress Notes (Signed)
Patient ID: Antonio Ramos, male   DOB: 1960-10-20, 52 y.o.   MRN: [id number]   Subjective:   HPI: Antonio Ramos is a 52 y.o. gentleman with a symmetrical history of hypertension, PUD, recent admission for hemorrhagic stroke following an upper GI bleeding, presents to the clinic for hospital followup visit.  [name] was recently admitted from 8/20 to 08/22 for an acute GI bleeding. Patient was in hemorrhagic shock and was briefly in the ICU. He required 8 units of blood transfusion, and 2 units of FFP's.  Emergency EGD was performed and revealed bleeding a bleeding ulcer which was treated with epi inj. No biopsies were performed. He was discharged in a stable general condition, with hemoglobin of 8.4. He continues to take his iron sulfate, and Protonix as prescribed. Patient's H. pylori antibodies came back positive. In the interim, patient reports no history of hematemesis, no melena, no abdominal pain, fevers, chills, or constipation. Patient's appetite has been normal. He denies fatigue. No dizziness, palpitations, headache, blurring of vision or general body weakness.  Patient reports that he was treated for H. pylori while in [place] in 2005 for 6 months.  He brings in paperwork for completion of a sick leave from his job.    Past Medical History  Diagnosis Date  . Hypertension   . PUD (peptic ulcer disease)     Diagnosed in 1986, with 3 prior bleeds since that time, most recently in 2004  . Hemorrhagic shock 11/23/2012   Current Outpatient Prescriptions  Medication Sig Dispense Refill  . bismuth subsalicylate (PINK BISMUTH) 262 MG/15ML suspension Take 15 mLs by mouth every 6 (six) hours as needed for indigestion.  840 mL  0  . docusate sodium (COLACE) 100 MG capsule Take 1 capsule (100 mg total) by mouth 2 (two) times daily.  60 capsule  0  . ferrous sulfate 325 (65 FE) MG tablet Take 1 tablet (325 mg total) by mouth daily with breakfast.  30 tablet  0  . metroNIDAZOLE (FLAGYL) 250  MG tablet Take 1 tablet (250 mg total) by mouth 4 (four) times daily.  56 tablet  0  . pantoprazole (PROTONIX) 40 MG tablet Take 1 tablet (40 mg total) by mouth daily.  30 tablet  3  . tetracycline (ACHROMYCIN,SUMYCIN) 500 MG capsule Take 1 capsule (500 mg total) by mouth 4 (four) times daily.  56 capsule  0   No current facility-administered medications for this visit.   Family History  Problem Relation Age of Onset  . Heart attack Father     at age of 79   History   Social History  . Marital Status: Married    Spouse Name: N/A    Number of Children: N/A  . Years of Education: N/A   Social History Main Topics  . Smoking status: Current Some Day Smoker -- 1.00 packs/day for 24 years    Types: Cigarettes  . Smokeless tobacco: None  . 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 Drug Use: No  . Sexual Activity: None   Other Topics Concern  . None   Social History Narrative   Moved from [place] to [place] in 2009.  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   Review of Systems: Constitutional: Denies fever, chills, diaphoresis, appetite change and fatigue.  Respiratory: Denies SOB, DOE, cough, chest tightness, and wheezing.  Cardiovascular: No chest  pain, palpitations and leg swelling.  Gastrointestinal: No abdominal pain, nausea, vomiting, bloody stools Genitourinary: No dysuria, frequency, hematuria, or flank pain.  Musculoskeletal: bilateral calf pains which he attributes to SCDs when he was in hospital. He has been using warm compression with some relief  Objective:  Physical Exam: Filed Vitals:   11/28/12 1353 11/28/12 1415 11/28/12 1416  BP: 154/87 140/80   Pulse: 99  70  Temp: 98.4 F (36.9 C)    TempSrc: Oral    Height: 6\' 2"  (1.88 m)    Weight: 188 lb 14.4 oz (85.684 kg)    SpO2: 98%     General: Well nourished. No acute distress.  Lungs: CTA bilaterally. Heart: RRR; no extra sounds or murmurs    Abdomen: Non-distended, normal BS, soft, nontender; no hepatosplenomegaly  Extremities: minimal pedal edema. No joint swelling or tenderness. Mild bilateral calf tenderness. Neurologic: Alert and oriented x3. No obvious neurologic deficits.  Assessment & Plan:  I have discussed my assessment and plan  with Dr. Dalphine Handing  as detailed under problem based charting.

## 2012-11-29 NOTE — Assessment & Plan Note (Signed)
Patient reports that he was previously treated in 2005 with antibiotics. I have no access to his records.  Plan. -Quadruple therapy for H. Pylori with tetracycline, metronidazole and bismuth and a PPI. GI recommended 3 month of PPI -Stool H. pylori antigen -Will need repeat stool antigen test (in case his initial H. pylori antigen were positive)

## 2012-11-29 NOTE — Assessment & Plan Note (Addendum)
Schedule appt with Dr Ewing Schlein of GI for 12/12/12 at 2:15pm. Discussed with Dr Matthias Hughs who recommended going ahead with treatment since repeat EGD may not be needed. Encouraged patient to f/u with GI appt. Provided him with address and details. We will check CBC today. Continue with Fe supplementation.

## 2012-11-29 NOTE — Progress Notes (Signed)
Case discussed with Dr.Kazibwe at the time of the visit.  We reviewed the resident's history and exam and pertinent patient test results.  I agree with the assessment, diagnosis, and plan of care documented in the resident's note.    

## 2012-12-01 ENCOUNTER — Ambulatory Visit: Payer: BC Managed Care – PPO | Admitting: Internal Medicine

## 2012-12-29 ENCOUNTER — Encounter: Payer: BC Managed Care – PPO | Admitting: Internal Medicine

## 2012-12-29 ENCOUNTER — Encounter: Payer: Self-pay | Admitting: Internal Medicine

## 2013-01-31 ENCOUNTER — Ambulatory Visit: Payer: BC Managed Care – PPO | Admitting: Internal Medicine

## 2013-02-02 ENCOUNTER — Encounter: Payer: Self-pay | Admitting: Internal Medicine

## 2013-02-02 ENCOUNTER — Ambulatory Visit (INDEPENDENT_AMBULATORY_CARE_PROVIDER_SITE_OTHER): Payer: BC Managed Care – PPO | Admitting: Internal Medicine

## 2013-02-02 VITALS — BP 179/98 | HR 81 | Temp 97.2°F | Ht 74.0 in | Wt 189.2 lb

## 2013-02-02 DIAGNOSIS — Z0000 Encounter for general adult medical examination without abnormal findings: Secondary | ICD-10-CM

## 2013-02-02 DIAGNOSIS — Z720 Tobacco use: Secondary | ICD-10-CM | POA: Insufficient documentation

## 2013-02-02 DIAGNOSIS — K279 Peptic ulcer, site unspecified, unspecified as acute or chronic, without hemorrhage or perforation: Secondary | ICD-10-CM

## 2013-02-02 DIAGNOSIS — F17200 Nicotine dependence, unspecified, uncomplicated: Secondary | ICD-10-CM

## 2013-02-02 DIAGNOSIS — R894 Abnormal immunological findings in specimens from other organs, systems and tissues: Secondary | ICD-10-CM

## 2013-02-02 DIAGNOSIS — R768 Other specified abnormal immunological findings in serum: Secondary | ICD-10-CM

## 2013-02-02 DIAGNOSIS — Z23 Encounter for immunization: Secondary | ICD-10-CM

## 2013-02-02 DIAGNOSIS — I10 Essential (primary) hypertension: Secondary | ICD-10-CM

## 2013-02-02 LAB — CBC
Hemoglobin: 12 g/dL — ABNORMAL LOW (ref 13.0–17.0)
MCH: 24.6 pg — ABNORMAL LOW (ref 26.0–34.0)
Platelets: 226 10*3/uL (ref 150–400)
RBC: 4.87 MIL/uL (ref 4.22–5.81)

## 2013-02-02 MED ORDER — PANTOPRAZOLE SODIUM 40 MG PO TBEC: 40.0000 mg | DELAYED_RELEASE_TABLET | Freq: Every day | ORAL | Status: DC

## 2013-02-02 MED ORDER — HYDROCHLOROTHIAZIDE 25 MG PO TABS: 25.0000 mg | ORAL_TABLET | Freq: Every day | ORAL | Status: DC

## 2013-02-02 NOTE — Assessment & Plan Note (Signed)
BP Readings from Last 3 Encounters:  02/02/13 179/98  11/28/12 140/80  11/25/12 126/62    Lab Results  Component Value Date   NA 137 11/25/2012   K 3.8 11/25/2012   CREATININE 1.03 11/25/2012    Assessment: Blood pressure control: moderately elevated Progress toward BP goal:  deteriorated Comments: BP is elevated today, on no medications.  We will start HCTZ  Plan: Medications:  Start HCTZ 25 mg daily Educational resources provided: brochure Self management tools provided: home blood pressure logbook Other plans: Recheck at next visit.  Consider BMET at next visit.

## 2013-02-02 NOTE — Progress Notes (Signed)
HPI The patient is a 52 y.o. male with a history of HTN, PUD, presenting for a follow-up of PUD.  The patient has a history of PUD, hospitalized 11/2012 for bleeding upper GI ulcer.  He was started on quadruple therapy for H pylori 8/25.  He notes taking his medications qid.  He needs a refill of his protonix.  He is also taking iron tablets.  He notes no abdominal pain, nausea/vomiting, BRB or melena in his stool.  The patient has followed up with GI after his last visit.  The patient's BP is elevated today. He has a history of HTN, though currently not on any medication.  The patient notes cutting back on his cigarettes from 1.5 ppd, to 4 cigs/day through willpower.  He is not interested in trying a smoking cessation aid. However, he notes that he will no longer be smoking at all by the next time that I see him.  ROS: General: no fevers, chills, changes in weight, changes in appetite Skin: no rash HEENT: no blurry vision, hearing changes, sore throat Pulm: no dyspnea, coughing, wheezing CV: no chest pain, palpitations, shortness of breath Abd: no abdominal pain, nausea/vomiting, diarrhea/constipation GU: no dysuria, hematuria, polyuria Ext: no arthralgias, myalgias Neuro: no weakness, numbness, or tingling  Filed Vitals:   02/02/13 1314  BP: 179/98  Pulse: 81  Temp: 97.2 F (36.2 C)    PEX General: alert, cooperative, and in no apparent distress HEENT: pupils equal round and reactive to light, vision grossly intact, oropharynx clear and non-erythematous  Neck: supple Lungs: clear to ascultation bilaterally, normal work of respiration, no wheezes, rales, ronchi Heart: regular rate and rhythm, no murmurs, gallops, or rubs Abdomen: soft, non-tender, non-distended, normal bowel sounds Extremities: no cyanosis, clubbing, or edema Neurologic: alert & oriented X3, cranial nerves II-XII intact, strength grossly intact, sensation intact to light touch  Current Outpatient Prescriptions  on File Prior to Visit  Medication Sig Dispense Refill  . bismuth subsalicylate (PINK BISMUTH) 262 MG/15ML suspension Take 15 mLs by mouth every 6 (six) hours as needed for indigestion.  840 mL  0  . docusate sodium (COLACE) 100 MG capsule Take 1 capsule (100 mg total) by mouth 2 (two) times daily.  60 capsule  0  . ferrous sulfate 325 (65 FE) MG tablet Take 1 tablet (325 mg total) by mouth daily with breakfast.  30 tablet  0  . metroNIDAZOLE (FLAGYL) 250 MG tablet Take 1 tablet (250 mg total) by mouth 4 (four) times daily.  56 tablet  0  . pantoprazole (PROTONIX) 40 MG tablet Take 1 tablet (40 mg total) by mouth daily.  30 tablet  3  . tetracycline (ACHROMYCIN,SUMYCIN) 500 MG capsule Take 1 capsule (500 mg total) by mouth 4 (four) times daily.  56 capsule  0   No current facility-administered medications on file prior to visit.    Assessment/Plan

## 2013-02-02 NOTE — Assessment & Plan Note (Signed)
-  flu shot given today -pt states he had a colonoscopy at [facility] in [place], [place].  Will attempt to obtain records.

## 2013-02-02 NOTE — Patient Instructions (Signed)
General Instructions: For your ulcer, continue to take Protonix, one tablet daily. -STOP taking Tetracycline and Flagyl -we are checking your blood level today  For your high blood pressure, we are starting HCTZ (hydrochlorothiazide), 1 tablet every morning  Please return for a blood pressure re-check in 3-4 weeks.   Treatment Goals:  Goals (1 Years of Data) as of 02/02/13   None      Progress Toward Treatment Goals:  Treatment Goal 02/02/2013  Blood pressure deteriorated  Stop smoking smoking less    Self Care Goals & Plans:  Self Care Goal 02/02/2013  Manage my medications take my medicines as prescribed; bring my medications to every visit  Stop smoking cut down the number of cigarettes smoked; call [facility] (1-800-QUIT-NOW)    No flowsheet data found.   Care Management & Community Referrals:  Referral 02/02/2013  Referrals made for care management support none needed

## 2013-02-02 NOTE — Assessment & Plan Note (Addendum)
The patient has now completed 2 months of quadruple therapy. He notes no abdominal pain or blood/melena in his stool. He continues to take iron supplements. He has stopped drinking alcohol. -Refilled Protonix -Stop tetracycline, bismuth, Flagyl -Check CBC today, to determine whether patient needs to continue iron supplementation -Continue to followup with GI  Addendum 10/31: CBC shows Hb has improved from around 8 to around 12.  However, MCV remains low, indicating low iron, likely due to prior acute blood loss.  Patient was called and informed of this, and instructed to continue taking iron supplementation, 3 times per day (or less frequently if pt experiences GI side effects such as constipation).  Pt expressed understanding.  Can recheck in 6-12 months, and consider discontinuing iron supplementation at that time.

## 2013-02-02 NOTE — Assessment & Plan Note (Signed)
  Assessment: Progress toward smoking cessation:  smoking less Barriers to progress toward smoking cessation:  none Comments: The patient notes wanting to quit smoking. He has cut back significantly through willpower alone. He believes that he can quit altogether on his own, without smoking cessation aid  Plan: Instruction/counseling given:  I counseled patient on the dangers of tobacco use, advised patient to stop smoking, and reviewed strategies to maximize success. Educational resources provided:  [employer] [employer]) brochure Self management tools provided:  smoking cessation plan ([employer] Quit Plan) Medications to assist with smoking cessation:  None Patient agreed to the following self-care plans for smoking cessation: cut down the number of cigarettes smoked;call [facility] (1-800-QUIT-NOW)  Other plans: Reassess at next visit

## 2013-02-13 NOTE — Progress Notes (Signed)
Case discussed with Dr. Brown at the time of the visit.  We reviewed the resident's history and exam and pertinent patient test results.  I agree with the assessment, diagnosis and plan of care documented in the resident's note. 

## 2013-02-22 ENCOUNTER — Encounter: Payer: Self-pay | Admitting: Internal Medicine

## 2013-02-22 ENCOUNTER — Ambulatory Visit: Payer: BC Managed Care – PPO | Admitting: Internal Medicine

## 2013-03-10 ENCOUNTER — Ambulatory Visit (INDEPENDENT_AMBULATORY_CARE_PROVIDER_SITE_OTHER): Payer: BC Managed Care – PPO | Admitting: Internal Medicine

## 2013-03-10 VITALS — BP 144/94 | HR 76 | Temp 97.3°F | Ht 74.0 in | Wt 191.4 lb

## 2013-03-10 DIAGNOSIS — F17200 Nicotine dependence, unspecified, uncomplicated: Secondary | ICD-10-CM

## 2013-03-10 DIAGNOSIS — K279 Peptic ulcer, site unspecified, unspecified as acute or chronic, without hemorrhage or perforation: Secondary | ICD-10-CM

## 2013-03-10 DIAGNOSIS — Z720 Tobacco use: Secondary | ICD-10-CM

## 2013-03-10 DIAGNOSIS — I10 Essential (primary) hypertension: Secondary | ICD-10-CM

## 2013-03-10 LAB — COMPLETE METABOLIC PANEL WITH GFR
ALT: 19 U/L (ref 0–53)
Albumin: 4.1 g/dL (ref 3.5–5.2)
Alkaline Phosphatase: 69 U/L (ref 39–117)
CO2: 29 mEq/L (ref 19–32)
GFR, Est Non African American: 79 mL/min
Glucose, Bld: 81 mg/dL (ref 70–99)
Potassium: 4.3 mEq/L (ref 3.5–5.3)
Sodium: 140 mEq/L (ref 135–145)
Total Bilirubin: 0.3 mg/dL (ref 0.3–1.2)
Total Protein: 7.3 g/dL (ref 6.0–8.3)

## 2013-03-10 LAB — CBC
HCT: 37.8 % — ABNORMAL LOW (ref 39.0–52.0)
Hemoglobin: 12.9 g/dL — ABNORMAL LOW (ref 13.0–17.0)
MCH: 24.8 pg — ABNORMAL LOW (ref 26.0–34.0)
MCHC: 34.1 g/dL (ref 30.0–36.0)
RDW: 21.4 % — ABNORMAL HIGH (ref 11.5–15.5)

## 2013-03-10 NOTE — Patient Instructions (Addendum)
1. Congratulation on your smoking cassation 2. Please check your blood pressures daily at home and call the clinic with all readings in 2 weeks 3. Continue current therapy 4. Follow up in 3 months.   2 Gram Low Sodium Diet A 2 gram sodium diet restricts the amount of sodium in the diet to no more than 2 g or 2000 mg daily. Limiting the amount of sodium is often used to help lower blood pressure. It is important if you have heart, liver, or kidney problems. Many foods contain sodium for flavor and sometimes as a preservative. When the amount of sodium in a diet needs to be low, it is important to know what to look for when choosing foods and drinks. The following includes some information and guidelines to help make it easier for you to adapt to a low sodium diet. QUICK TIPS  Do not add salt to food.  Avoid convenience items and fast food.  Choose unsalted snack foods.  Buy lower sodium products, often labeled as "lower sodium" or "no salt added."  Check food labels to learn how much sodium is in 1 serving.  When eating at a restaurant, ask that your food be prepared with less salt or none, if possible. READING FOOD LABELS FOR SODIUM INFORMATION The nutrition facts label is a good place to find how much sodium is in foods. Look for products with no more than 500 to 600 mg of sodium per meal and no more than 150 mg per serving. Remember that 2 g = 2000 mg. The food label may also list foods as:  Sodium-free: Less than 5 mg in a serving.  Very low sodium: 35 mg or less in a serving.  Low-sodium: 140 mg or less in a serving.  Light in sodium: 50% less sodium in a serving. For example, if a food that usually has 300 mg of sodium is changed to become light in sodium, it will have 150 mg of sodium.  Reduced sodium: 25% less sodium in a serving. For example, if a food that usually has 400 mg of sodium is changed to reduced sodium, it will have 300 mg of sodium. CHOOSING  FOODS Grains  Avoid: Salted crackers and snack items. Some cereals, including instant hot cereals. Bread stuffing and biscuit mixes. Seasoned rice or pasta mixes.  Choose: Unsalted snack items. Low-sodium cereals, oats, puffed wheat and rice, shredded wheat. English muffins and bread. Pasta. Meats  Avoid: Salted, canned, smoked, spiced, pickled meats, including fish and poultry. Bacon, ham, sausage, cold cuts, hot dogs, anchovies.  Choose: Low-sodium canned tuna and salmon. Fresh or frozen meat, poultry, and fish. Dairy  Avoid: Processed cheese and spreads. Cottage cheese. Buttermilk and condensed milk. Regular cheese.  Choose: Milk. Low-sodium cottage cheese. Yogurt. Sour cream. Low-sodium cheese. Fruits and Vegetables  Avoid: Regular canned vegetables. Regular canned tomato sauce and paste. Frozen vegetables in sauces. Olives. Rosita Fire. Relishes. Sauerkraut.  Choose: Low-sodium canned vegetables. Low-sodium tomato sauce and paste. Frozen or fresh vegetables. Fresh and frozen fruit. Condiments  Avoid: Canned and packaged gravies. Worcestershire sauce. Tartar sauce. Barbecue sauce. Soy sauce. Steak sauce. Ketchup. Onion, garlic, and table salt. Meat flavorings and tenderizers.  Choose: Fresh and dried herbs and spices. Low-sodium varieties of mustard and ketchup. Lemon juice. Tabasco sauce. Horseradish. SAMPLE 2 GRAM SODIUM MEAL PLAN Breakfast / Sodium (mg)  1 cup low-fat milk / 143 mg  2 slices whole-wheat toast / 270 mg  1 tbs heart-healthy margarine / 153 mg  1 hard-boiled egg / 139 mg  1 small orange / 0 mg Lunch / Sodium (mg)  1 cup raw carrots / 76 mg   cup hummus / 298 mg  1 cup low-fat milk / 143 mg   cup red grapes / 2 mg  1 whole-wheat pita bread / 356 mg Dinner / Sodium (mg)  1 cup whole-wheat pasta / 2 mg  1 cup low-sodium tomato sauce / 73 mg  3 oz lean ground beef / 57 mg  1 small side salad (1 cup raw spinach leaves,  cup cucumber,  cup  yellow bell pepper) with 1 tsp olive oil and 1 tsp red wine vinegar / 25 mg Snack / Sodium (mg)  1 container low-fat vanilla yogurt / 107 mg  3 graham cracker squares / 127 mg Nutrient Analysis  Calories: 2033  Protein: 77 g  Carbohydrate: 282 g  Fat: 72 g  Sodium: 1971 mg Document Released: 03/23/2005 Document Revised: 06/15/2011 Document Reviewed: 06/24/2009 [employer] Patient Information 2014 [place], [place].  Iron-Rich Diet An iron-rich diet contains foods that are good sources of iron. Iron is an important mineral that helps your body produce hemoglobin. Hemoglobin is a protein in red blood cells that carries oxygen to the body's tissues. Sometimes, the iron level in your blood can be low. This may be caused by:  A lack of iron in your diet.  Blood loss.  Times of growth, such as during pregnancy or during a child's growth and development. Low levels of iron can cause a decrease in the number of red blood cells. This can result in iron deficiency anemia. Iron deficiency anemia symptoms include:  Tiredness.  Weakness.  Irritability.  Increased chance of infection. Here are some recommendations for daily iron intake:  Males older than 54 years of age need 8 mg of iron per day.  Women ages 27 to 31 need 18 mg of iron per day.  Pregnant women need 27 mg of iron per day, and women who are over 67 years of age and breastfeeding need 9 mg of iron per day.  Women over the age of 1 need 8 mg of iron per day. SOURCES OF IRON There are 2 types of iron that are found in food: heme iron and nonheme iron. Heme iron is absorbed by the body better than nonheme iron. Heme iron is found in meat, poultry, and fish. Nonheme iron is found in grains, beans, and vegetables. Heme Iron Sources Food / Iron (mg)  Chicken liver, 3 oz (85 g)/ 10 mg  Beef liver, 3 oz (85 g)/ 5.5 mg  Oysters, 3 oz (85 g)/ 8 mg  Beef, 3 oz (85 g)/ 2 to 3 mg  Shrimp, 3 oz (85 g)/ 2.8 mg  [place], 3 oz  (85 g)/ 2 mg  Chicken, 3 oz (85 g) / 1 mg  Fish (tuna, halibut), 3 oz (85 g)/ 1 mg  Pork, 3 oz (85 g)/ 0.9 mg Nonheme Iron Sources Food / Iron (mg)  Ready-to-eat breakfast cereal, iron-fortified / 3.9 to 7 mg  Tofu,  cup / 3.4 mg  Kidney beans,  cup / 2.6 mg  Baked potato with skin / 2.7 mg  Asparagus,  cup / 2.2 mg  Avocado / 2 mg  Dried peaches,  cup / 1.6 mg  Raisins,  cup / 1.5 mg  Soy milk, 1 cup / 1.5 mg  Whole-wheat bread, 1 slice / 1.2 mg  Spinach, 1 cup / 0.8 mg  Broccoli,  cup / 0.6 mg IRON ABSORPTION Certain foods can decrease the body's absorption of iron. Try to avoid these foods and beverages while eating meals with iron-containing foods:  Coffee.  Tea.  Fiber.  Soy. Foods containing vitamin C can help increase the amount of iron your body absorbs from iron sources, especially from nonheme sources. Eat foods with vitamin C along with iron-containing foods to increase your iron absorption. Foods that are high in vitamin C include many fruits and vegetables. Some good sources are:  Fresh orange juice.  Oranges.  Strawberries.  Mangoes.  Grapefruit.  Red bell peppers.  Green bell peppers.  Broccoli.  Potatoes with skin.  Tomato juice. Document Released: 11/04/2004 Document Revised: 06/15/2011 Document Reviewed: 09/11/2010 [facility] Patient Information 2014 [place], [place].

## 2013-03-10 NOTE — Assessment & Plan Note (Addendum)
Assessment: He feels well. No complaints. He states that he does not take iron pills any more. He reports medical compliance with Protonix.   Plan - CBC given his recent hemorrhagic shock.  - continue Protonix.

## 2013-03-10 NOTE — Assessment & Plan Note (Signed)
  Assessment: Progress toward smoking cessation:  stopped smoking Barriers to progress toward smoking cessation:      Plan: Instruction/counseling given:  I counseled patient on the dangers of tobacco use, advised patient to stop smoking, and reviewed strategies to maximize success. Educational resources provided:    Self management tools provided:    Medications to assist with smoking cessation:  None Patient agreed to the following self-care plans for smoking cessation:

## 2013-03-10 NOTE — Assessment & Plan Note (Signed)
Assessment: Patient was started on HCTZ in Oct. 2014 and reports medical compliance. He is noted to have mildly elevated BP at the clinic today.   Plan: - Patient is instructed on low salt diet. - continue HCTZ - Patient is instructed to check his BP daily at home and call the clinic in 2 weeks to report the readings. He agrees with the plan.

## 2013-03-10 NOTE — Progress Notes (Signed)
   Patient: Antonio Ramos   MRN: [id number]  DOB: November 12, 1960  PCP: Carlynn Purl, DO   Subjective:    CC: Follow-up   HPI: Mr. Kervin Bones is a 52 y.o. male with a PMHx of HTN, PUD and H. pylori positive complicated by hemorrhagic shock in August 2014 and tobacco abuse, who presented to clinic today for the following:  # PUD and positive H. pylori    He has a history of PUD and positive H Pylori, and was hospitalized in 11/2012 for PUD complicated by hemorrhagic shock. He was started on quadruple therapy for H pylori on 11/28/12. He was started on Iron supplement. Denies N/V/d and abdominal pain, abdominal pain, BRB or melena in his stool. The patient has followed up with GI in Nov. 2014. He is on Protonix now.   # HTN    He was noted to have elevated BP and started on HCTZ 25 mg po daily. He reports medical compliance.   # Smoking cessation     He stopped smoking one month ago.   # Health maintenance - colonoscopy--declined.    Review of Systems: Per HPI.   Current Outpatient Medications: Current Outpatient Prescriptions  Medication Sig Dispense Refill  . hydrochlorothiazide (HYDRODIURIL) 25 MG tablet Take 1 tablet (25 mg total) by mouth daily.  30 tablet  11  . pantoprazole (PROTONIX) 40 MG tablet Take 1 tablet (40 mg total) by mouth daily.  30 tablet  11  . ferrous sulfate 325 (65 FE) MG tablet Take 1 tablet (325 mg total) by mouth daily with breakfast.  30 tablet  0   No current facility-administered medications for this visit.    Allergies: No Known Allergies  Past Medical History  Diagnosis Date  . Hypertension   . PUD (peptic ulcer disease)     Diagnosed in 1986, with 3 prior bleeds since that time, most recently in 2004  . Hemorrhagic shock 11/23/2012    Objective:    Physical Exam: Filed Vitals:   03/10/13 1330 03/10/13 1342 03/10/13 1601  BP: 154/99 144/94 144/94  Pulse: 76    Temp: 97.3 F (36.3 C)    TempSrc: Oral    Height: 6\' 2"  (1.88 m)    Weight:  191 lb 6.4 oz (86.818 kg)    SpO2: 100%      General: Vital signs reviewed and noted. Well-developed, well-nourished, in no acute distress; alert, appropriate and cooperative throughout examination.  Head: Normocephalic, atraumatic.  Lungs:  Normal respiratory effort. Clear to auscultation BL without crackles or wheezes.  Heart: RRR. S1 and S2 normal without gallop, rubs, murmur.  Abdomen:  BS normoactive. Soft, Nondistended, non-tender.  No masses or organomegaly.  Extremities: No pretibial edema.   Assessment/ Plan:

## 2013-03-27 NOTE — Progress Notes (Signed)
Case discussed with Dr. Li soon after the resident saw the patient. We reviewed the resident's history and exam and pertinent patient test results. I agree with the assessment, diagnosis, and plan of care documented in the resident's note. 

## 2013-06-08 ENCOUNTER — Ambulatory Visit (INDEPENDENT_AMBULATORY_CARE_PROVIDER_SITE_OTHER): Payer: BC Managed Care – PPO | Admitting: Internal Medicine

## 2013-06-08 ENCOUNTER — Encounter: Payer: Self-pay | Admitting: Internal Medicine

## 2013-06-08 VITALS — BP 148/91 | HR 91 | Temp 97.4°F | Ht 74.0 in | Wt 198.3 lb

## 2013-06-08 DIAGNOSIS — F17200 Nicotine dependence, unspecified, uncomplicated: Secondary | ICD-10-CM

## 2013-06-08 DIAGNOSIS — Z0000 Encounter for general adult medical examination without abnormal findings: Secondary | ICD-10-CM

## 2013-06-08 DIAGNOSIS — I10 Essential (primary) hypertension: Secondary | ICD-10-CM

## 2013-06-08 DIAGNOSIS — Z720 Tobacco use: Secondary | ICD-10-CM

## 2013-06-08 MED ORDER — LISINOPRIL-HYDROCHLOROTHIAZIDE 20-25 MG PO TABS: 1.0000 | ORAL_TABLET | Freq: Every day | ORAL | Status: DC

## 2013-06-08 NOTE — Assessment & Plan Note (Addendum)
Discussed Colonoscopy and risks of colon cancer. Patient reports he thinks he had a colonoscopy years ago in [place].  Reports he does not want a colonoscopy under any circumstances at this time and would "rather die from colon cancer." Discussed Pneumovax, willing to get but wants to get at next visit.

## 2013-06-08 NOTE — Assessment & Plan Note (Signed)
  Assessment: Progress toward smoking cessation:  smoking less Barriers to progress toward smoking cessation:    Comments: 6 cigs or less a day, in pre contemplation stage  Plan: Instruction/counseling given:  I counseled patient on the dangers of tobacco use, advised patient to stop smoking, and reviewed strategies to maximize success. Educational resources provided:    Self management tools provided:    Medications to assist with smoking cessation:  None Patient agreed to the following self-care plans for smoking cessation: set a quit date and stop smoking  Other plans: Pneumovax at next visit.  Continue to encourage smoking cessation.

## 2013-06-08 NOTE — Progress Notes (Signed)
Case discussed with Dr. Hoffman at the time of the visit.  We reviewed the resident's history and exam and pertinent patient test results.  I agree with the assessment, diagnosis and plan of care documented in the resident's note. 

## 2013-06-08 NOTE — Assessment & Plan Note (Signed)
BP Readings from Last 3 Encounters:  06/08/13 148/91  03/10/13 144/94  02/02/13 179/98    Lab Results  Component Value Date   NA 140 03/10/2013   K 4.3 03/10/2013   CREATININE 1.06 03/10/2013    Assessment: Blood pressure control: mildly elevated Progress toward BP goal:  improved Comments:   Plan: Medications:  D/C HCTZ 25mg , Start Lisinopril 20-25mg  QD Educational resources provided:   Product manager tools provided:   Other plans: Will check BMP at next visit in 4-6 weeks.  Also check Lipid panel.

## 2013-06-08 NOTE — Progress Notes (Signed)
[facility] Subjective:   Patient ID: Antonio Ramos male   DOB: 1960/11/27 53 y.o.   MRN: [id number]  HPI:  Pedrito Ramos is a 53 y.o. male with a PMH significant for HTN, PUD due to H pylori complicated by hemorrhagic shock in 11/2012. He presents today for follow up of BP.  At this last visit in dec 2014 he was instructed to continue HCTZ and start a low salt diet.  He reports compliance with his medications and low salt diet.  He has no acute complaints and reports he feels like a "brand new man."    Past Medical History  Diagnosis Date  . Hypertension   . PUD (peptic ulcer disease)     Diagnosed in 1986, with 3 prior bleeds since that time, most recently in 2004  . Hemorrhagic shock 11/23/2012   Current Outpatient Prescriptions  Medication Sig Dispense Refill  . ferrous sulfate 325 (65 FE) MG tablet Take 1 tablet (325 mg total) by mouth daily with breakfast.  30 tablet  0  . pantoprazole (PROTONIX) 40 MG tablet Take 1 tablet (40 mg total) by mouth daily.  30 tablet  11  . lisinopril-hydrochlorothiazide (PRINZIDE,ZESTORETIC) 20-25 MG per tablet Take 1 tablet by mouth daily.  30 tablet  5   No current facility-administered medications for this visit.   Family History  Problem Relation Age of Onset  . Heart attack Father     at age of 14   History   Social History  . Marital Status: Married    Spouse Name: N/A    Number of Children: N/A  . Years of Education: N/A   Social History Main Topics  . Smoking status: Current Some Day Smoker -- 0.20 packs/day for 24 years    Types: Cigarettes  . Smokeless tobacco: None     Comment: Decreased to 4 cigs/day.  . 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 Drug Use: No  . Sexual Activity: None   Other Topics Concern  . None   Social History Narrative   Moved from [place] to [place] in 2009.  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   Review of Systems: Review of Systems   Constitutional: Negative for fever, chills and malaise/fatigue.  HENT: Negative for sore throat.   Respiratory: Negative for cough and shortness of breath.   Cardiovascular: Negative for chest pain and leg swelling.  Gastrointestinal: Negative for heartburn, nausea, vomiting, abdominal pain, diarrhea, constipation, blood in stool and melena.  Genitourinary: Negative for dysuria and frequency.  Musculoskeletal: Negative for myalgias.  Neurological: Negative for dizziness, weakness and headaches.  Psychiatric/Behavioral: Negative for depression. The patient is not nervous/anxious.     Objective:  Physical Exam: Filed Vitals:   06/08/13 1321  BP: 148/91  Pulse: 91  Temp: 97.4 F (36.3 C)  TempSrc: Oral  Height: 6\' 2"  (1.88 m)  Weight: 198 lb 4.8 oz (89.948 kg)  SpO2: 98%  Physical Exam  Nursing note and vitals reviewed. Constitutional: He is well-developed, well-nourished, and in no distress. No distress.  HENT:  Head: Normocephalic and atraumatic.  Eyes: EOM are normal.  Cardiovascular: Normal rate, regular rhythm, normal heart sounds and intact distal pulses.   No murmur heard. Pulmonary/Chest: Effort normal and breath sounds normal. No respiratory distress. He has no wheezes.  Musculoskeletal: He exhibits edema (trace).  Skin: He is not diaphoretic.    Assessment & Plan:  Case discussed with Dr. Josem KaufmannKlima See Problem Based Assessment and Plan Medications Ordered Meds ordered this encounter  Medications  . lisinopril-hydrochlorothiazide (PRINZIDE,ZESTORETIC) 20-25 MG per tablet    Sig: Take 1 tablet by mouth daily.    Dispense:  30 tablet    Refill:  5    Please discontinue previous script for HCTZ 25mg .   Other Orders No orders of the defined types were placed in this encounter.

## 2013-06-08 NOTE — Patient Instructions (Signed)
Please pick up your new prescription for Lisinopril-HCTZ 20-25mg  and take 1 tablet each day.  Stop taking hydrochlorothiazide 25mg . Continue to take protonix (use your new coupon) if unable call our office and we can change this medication. Please follow up with us in 4-6 weeks.  We will need to do lab work at this visit.  Hydrochlorothiazide, HCTZ; Lisinopril tablets What is this medicine? HYDROCHLOROTHIAZIDE; LISINOPRIL (hye droe klor oh THYE a zide; lyse IN oh pril) is a combination of a diuretic and an ACE inhibitor. It is used to treat high blood pressure. This medicine may be used for other purposes; ask your health care provider or pharmacist if you have questions. COMMON BRAND NAME(S): Prinzide, Zestoretic What should I tell my health care provider before I take this medicine? They need to know if you have any of these conditions: -bone marrow disease -decreased urine -heart or blood vessel disease -if you are on a special diet like a low salt diet -immune system problems, like lupus -kidney disease -liver disease -previous swelling of the tongue, face, or lips with difficulty breathing, difficulty swallowing, hoarseness, or tightening of the throat -recent heart attack or stroke -an unusual or allergic reaction to lisinopril, hydrochlorothiazide, sulfa drugs, other medicines, insect venom, foods, dyes, or preservatives -pregnant or trying to get pregnant -breast-feeding How should I use this medicine? Take this medicine by mouth with a glass of water. Follow the directions on the prescription label. You can take it with or without food. If it upsets your stomach, take it with food. Take your medicine at regular intervals. Do not take it more often than directed. Do not stop taking except on your doctor's advice. Talk to your pediatrician regarding the use of this medicine in children. Special care may be needed. Overdosage: If you think you have taken too much of this medicine  contact a poison control center or emergency room at once. NOTE: This medicine is only for you. Do not share this medicine with others. What if I miss a dose? If you miss a dose, take it as soon as you can. If it is almost time for your next dose, take only that dose. Do not take double or extra doses. What may interact with this medicine? -barbiturates like phenobarbital -blood pressure medicines -corticosteroids like prednisone -diabetic medications -diuretics, especially triamterene, spironolactone or amiloride -lithium -NSAIDs like ibuprofen -potassium salts or potassium supplements -prescription pain medicines -skeletal muscle relaxants like tubocurarine -some cholesterol lowering medications like cholestyramine or colestipol This list may not describe all possible interactions. Give your health care provider a list of all the medicines, herbs, non-prescription drugs, or dietary supplements you use. Also tell them if you smoke, drink alcohol, or use illegal drugs. Some items may interact with your medicine. What should I watch for while using this medicine? Visit your doctor or health care professional for regular checks on your progress. Check your blood pressure as directed. Ask your doctor or health care professional what your blood pressure should be and when you should contact him or her. Call your doctor or health care professional if you notice an irregular or fast heart beat. You must not get dehydrated. Ask your doctor or health care professional how much fluid you need to drink a day. Check with him or her if you get an attack of severe diarrhea, nausea and vomiting, or if you sweat a lot. The loss of too much body fluid can make it dangerous for you to take this medicine.  Women should inform their doctor if they wish to become pregnant or think they might be pregnant. There is a potential for serious side effects to an unborn child. Talk to your health care professional or  pharmacist for more information. You may get drowsy or dizzy. Do not drive, use machinery, or do anything that needs mental alertness until you know how this drug affects you. Do not stand or sit up quickly, especially if you are an older patient. This reduces the risk of dizzy or fainting spells. Alcohol can make you more drowsy and dizzy. Avoid alcoholic drinks. This medicine may affect your blood sugar level. If you have diabetes, check with your doctor or health care professional before changing the dose of your diabetic medicine. Avoid salt substitutes unless you are told otherwise by your doctor or health care professional. This medicine can make you more sensitive to the sun. Keep out of the sun. If you cannot avoid being in the sun, wear protective clothing and use sunscreen. Do not use sun lamps or tanning beds/booths. Do not treat yourself for coughs, colds, or pain while you are taking this medicine without asking your doctor or health care professional for advice. Some ingredients may increase your blood pressure. What side effects may I notice from receiving this medicine? Side effects that you should report to your doctor or health care professional as soon as possible: -changes in vision -confusion, dizziness, light headedness or fainting spells -decreased amount of urine passed -difficulty breathing or swallowing, hoarseness, or tightening of the throat -eye pain -fast or irregular heart beat, palpitations, or chest pain -muscle cramps -nausea and vomiting -persistent dry cough -redness, blistering, peeling or loosening of the skin, including inside the mouth -stomach pain -swelling of your face, lips, tongue, hands, or feet -unusual rash, bleeding or bruising, or pinpoint red spots on the skin -worsened gout pain -yellowing of the eyes or skin Side effects that usually do not require medical attention (report to your doctor or health care professional if they continue or are  bothersome): -change in sex drive or performance -cough -headache This list may not describe all possible side effects. Call your doctor for medical advice about side effects. You may report side effects to FDA at 1-800-FDA-1088. Where should I keep my medicine? Keep out of the reach of children. Store at room temperature between 20 and 25 degrees C (68 and 77 degrees F). Protect from moisture and excessive light. Keep container tightly closed. Throw away any unused medicine after the expiration date. NOTE: This sheet is a summary. It may not cover all possible information. If you have questions about this medicine, talk to your doctor, pharmacist, or health care provider.  2014, [employer]vier/Gold Standard. (2009-12-11 13:33:52)

## 2013-07-20 ENCOUNTER — Encounter: Payer: Self-pay | Admitting: Internal Medicine

## 2013-07-20 ENCOUNTER — Ambulatory Visit (INDEPENDENT_AMBULATORY_CARE_PROVIDER_SITE_OTHER): Payer: BC Managed Care – PPO | Admitting: Internal Medicine

## 2013-07-20 VITALS — BP 142/93 | HR 79 | Temp 98.2°F | Ht 74.0 in | Wt 200.6 lb

## 2013-07-20 DIAGNOSIS — Z9189 Other specified personal risk factors, not elsewhere classified: Secondary | ICD-10-CM

## 2013-07-20 DIAGNOSIS — Z0000 Encounter for general adult medical examination without abnormal findings: Secondary | ICD-10-CM

## 2013-07-20 DIAGNOSIS — F17200 Nicotine dependence, unspecified, uncomplicated: Secondary | ICD-10-CM

## 2013-07-20 DIAGNOSIS — Z720 Tobacco use: Secondary | ICD-10-CM

## 2013-07-20 DIAGNOSIS — I10 Essential (primary) hypertension: Secondary | ICD-10-CM

## 2013-07-20 DIAGNOSIS — Z23 Encounter for immunization: Secondary | ICD-10-CM

## 2013-07-20 DIAGNOSIS — D509 Iron deficiency anemia, unspecified: Secondary | ICD-10-CM | POA: Insufficient documentation

## 2013-07-20 LAB — CBC
HCT: 39.2 % (ref 39.0–52.0)
Hemoglobin: 13.2 g/dL (ref 13.0–17.0)
MCH: 27.6 pg (ref 26.0–34.0)
MCHC: 33.7 g/dL (ref 30.0–36.0)
MCV: 82 fL (ref 78.0–100.0)
PLATELETS: 201 10*3/uL (ref 150–400)
RBC: 4.78 MIL/uL (ref 4.22–5.81)
RDW: 15.3 % (ref 11.5–15.5)
WBC: 7 10*3/uL (ref 4.0–10.5)

## 2013-07-20 LAB — LIPID PANEL
CHOL/HDL RATIO: 4 ratio
Cholesterol: 193 mg/dL (ref 0–200)
HDL: 48 mg/dL (ref >39)
LDL CALC: 123 mg/dL — AB (ref 0–99)
Triglycerides: 108 mg/dL (ref <150)
VLDL: 22 mg/dL (ref 0–40)

## 2013-07-20 MED ORDER — LISINOPRIL-HYDROCHLOROTHIAZIDE 10-12.5 MG PO TABS: 1.0000 | ORAL_TABLET | Freq: Every day | ORAL | Status: DC

## 2013-07-20 NOTE — Progress Notes (Signed)
[facility] Subjective:   Patient ID: Antonio Ramos male   DOB: 1960-08-31 53 y.o.   MRN: [id number] CC: BP follow up HPI:  Antonio SlaterMr.Antonio Ramos is a 53 y.o. male with a PMH significant for HTN, PUD due to H pylori complicated by hemorrhagic shock in 11/2012. He presents today for follow up of BP.  At this last visit in in March lisinopril was added to his HCTZ to obtain better control of his blood pressure. He reports he took the combined pill for about 1 week, during this week he reported feeling light headed and dizzy at work.  He noted that he had to stop work a few times and rest.  He went back to taking HCTZ alone and reports he has tolerated that well.  He notes that he did check his BP a few times at [employer] (work) and it was always "normal." He notes overall he feels very well today.   Past Medical History  Diagnosis Date  . Hypertension   . PUD (peptic ulcer disease)     Diagnosed in 1986, with 3 prior bleeds since that time, most recently in 2004  . Hemorrhagic shock 11/23/2012   Current Outpatient Prescriptions  Medication Sig Dispense Refill  . ferrous sulfate 325 (65 FE) MG tablet Take 1 tablet (325 mg total) by mouth daily with breakfast.  30 tablet  0  . pantoprazole (PROTONIX) 40 MG tablet Take 1 tablet (40 mg total) by mouth daily.  30 tablet  11  . lisinopril-hydrochlorothiazide (PRINZIDE) 10-12.5 MG per tablet Take 1 tablet by mouth daily.  30 tablet  5   No current facility-administered medications for this visit.   Family History  Problem Relation Age of Onset  . Heart attack Father     at age of 55   History   Social History  . Marital Status: Married    Spouse Name: N/A    Number of Children: N/A  . Years of Education: N/A   Social History Main Topics  . Smoking status: Current Some Day Smoker -- 0.20 packs/day for 24 years    Types: Cigarettes  . Smokeless tobacco: Not on file     Comment: Decreased to 4 cigs/day.  . Alcohol Use: No      Comment: states used to drink heavily in 1983  . Drug Use: No  . Sexual Activity: Not on file   Other Topics Concern  . Not on file   Social History Narrative   [place]oved from [place] to [place] in 2009.  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  No PCP.   Review of Systems: Review of Systems  Constitutional: Negative for fever, chills and weight loss (slight weight gain).  HENT: Negative for sore throat.   Eyes: Negative for blurred vision.  Respiratory: Negative for cough, shortness of breath and wheezing.   Cardiovascular: Negative for chest pain.  Gastrointestinal: Negative for abdominal pain, diarrhea and constipation.  Genitourinary: Negative for dysuria.  Musculoskeletal: Negative for myalgias.  Neurological: Negative for dizziness, weakness and headaches.  Endo/Heme/Allergies: Negative for polydipsia.  Psychiatric/Behavioral: Negative for depression and substance abuse.    Objective:  Physical Exam: Filed Vitals:   07/20/13 1322 07/20/13 1345  BP: 150/89 142/93  Pulse: 78 79  Temp: 98.2 F (36.8 C)   TempSrc: Oral   Height: 6\' 2"  (1.88 m)   Weight: 200 lb 9.6 oz (90.992 kg)   SpO2: 98%   Physical Exam  Nursing note and  vitals reviewed. Constitutional: He is well-developed, well-nourished, and in no distress. No distress.  HENT:  Head: Normocephalic and atraumatic.  Eyes: EOM are normal.  Cardiovascular: Normal rate, regular rhythm, normal heart sounds and intact distal pulses.   No murmur heard. Pulmonary/Chest: Effort normal and breath sounds normal. No respiratory distress. He has no wheezes.  Musculoskeletal: He exhibits no edema.  Skin: He is not diaphoretic.    Assessment & Plan:  Case discussed with Dr. Heide Spark See Problem Based Assessment and Plan Medications Ordered Meds ordered this encounter  Medications  . DISCONTD: hydrochlorothiazide (HYDRODIURIL) 25 MG tablet    Sig:   . lisinopril-hydrochlorothiazide (PRINZIDE) 10-12.5 MG per tablet     Sig: Take 1 tablet by mouth daily.    Dispense:  30 tablet    Refill:  5   Other Orders Orders Placed This Encounter  Procedures  . Pneumococcal polysaccharide vaccine 23-valent greater than or equal to 2yo subcutaneous/IM  . CBC no Diff  . Lipid Profile

## 2013-07-20 NOTE — Assessment & Plan Note (Signed)
BP Readings from Last 3 Encounters:  07/20/13 142/93  06/08/13 148/91  03/10/13 144/94    Lab Results  Component Value Date   NA 140 03/10/2013   K 4.3 03/10/2013   CREATININE 1.06 03/10/2013    Assessment: Blood pressure control: mildly elevated Progress toward BP goal:  unchanged Comments: Has been taking only HCTZ 25mg  daily  Plan: Medications:  Lisinopril- HCTZ 10-12.5mg  daily Educational resources provided:   Self management tools provided:   Other plans: Patient did not tolerate 20-25mg  dose will reduce to 10-12.5ng and have patient back in 1-2 months for BP recheck and to obtain BMP.  Will check Lipid panel today.

## 2013-07-20 NOTE — Assessment & Plan Note (Addendum)
Patient has been taking iron supplements for the last several months, Iron stores should be replenished.  Will repeat CBC, if Hgb normal patient can discontinue Fe supplementation.  Addendum 07/21/13: Patient Hgb wnl, call and informed to stop taking iron supplements.

## 2013-07-20 NOTE — Patient Instructions (Signed)
Please start taking new dose of blood pressure medication Lisinopril- HCTZ 10-12.5 (this is half of the previous dose so you can break remaining pills in half if your want).  Continue to try to quit smoking.  If your blood count is normal I will call you and you can stop taking the iron pills and see how your energy is without taking them.  Thank you for bringing your medicines today. This helps [place] keep you safe from mistakes.

## 2013-07-20 NOTE — Assessment & Plan Note (Signed)
Pneumovax given today

## 2013-07-20 NOTE — Assessment & Plan Note (Signed)
  Assessment: Progress toward smoking cessation:  Smoking less Barriers to progress toward smoking cessation:    Comments:   Plan: Instruction/counseling given:  I counseled patient on the dangers of tobacco use, advised patient to stop smoking, and reviewed strategies to maximize success. Educational resources provided:    Self management tools provided:   (PATIENT SAYS HE IS QUITING ON HIS ON) Medications to assist with smoking cessation:  None Patient agreed to the following self-care plans for smoking cessation:    Other plans: Patient down to 4 cigs a day, want to quit completely without assistance, encouraged cessation.

## 2013-07-20 NOTE — Progress Notes (Signed)
INTERNAL MEDICINE TEACHING ATTENDING ADDENDUM - Sharifa Bucholz, MD: I reviewed and discussed at the time of visit with the resident Dr. Hoffman, the patient’s medical history, physical examination, diagnosis and results of tests and treatment and I agree with the patient's care as documented. ° °

## 2013-07-21 DIAGNOSIS — Z9189 Other specified personal risk factors, not elsewhere classified: Secondary | ICD-10-CM | POA: Insufficient documentation

## 2013-07-21 MED ORDER — PRAVASTATIN SODIUM 40 MG PO TABS: 40.0000 mg | ORAL_TABLET | Freq: Every evening | ORAL | Status: DC

## 2013-07-21 NOTE — Addendum Note (Signed)
Addended by: Gust Rung on: 07/21/2013 08:35 AM   Modules accepted: Orders

## 2013-07-21 NOTE — Assessment & Plan Note (Signed)
ADDENDUM: Patient with CVD risk score of 17%, we are working on smoking cessation and improvement of BP control.  Would recommend moderate to high intensity statin to further reduce risk.  He has normal LFTs in December. I discussed this with Antonio Ramos and we will start Pravastatin 40mg  daily.  Per NIH CVD risk calculator on 07/21/13 Age: 53 Gender: male Total Cholesterol: 193 mg/dL HDL Cholesterol: 48 mg/dL Smoker: Yes Systolic Blood Pressure: 142 mm/Hg On medication for HBP:      Yes Risk Score* 17%

## 2013-09-07 ENCOUNTER — Ambulatory Visit: Payer: BC Managed Care – PPO | Admitting: Internal Medicine

## 2013-09-11 ENCOUNTER — Encounter: Payer: Self-pay | Admitting: Internal Medicine

## 2013-09-11 ENCOUNTER — Ambulatory Visit (INDEPENDENT_AMBULATORY_CARE_PROVIDER_SITE_OTHER): Payer: BC Managed Care – PPO | Admitting: Internal Medicine

## 2013-09-11 VITALS — BP 156/101 | HR 87 | Temp 97.7°F | Ht 74.0 in | Wt 201.7 lb

## 2013-09-11 DIAGNOSIS — Z9189 Other specified personal risk factors, not elsewhere classified: Secondary | ICD-10-CM

## 2013-09-11 DIAGNOSIS — K279 Peptic ulcer, site unspecified, unspecified as acute or chronic, without hemorrhage or perforation: Secondary | ICD-10-CM

## 2013-09-11 DIAGNOSIS — Z720 Tobacco use: Secondary | ICD-10-CM

## 2013-09-11 DIAGNOSIS — I10 Essential (primary) hypertension: Secondary | ICD-10-CM

## 2013-09-11 DIAGNOSIS — Z789 Other specified health status: Secondary | ICD-10-CM

## 2013-09-11 DIAGNOSIS — F17200 Nicotine dependence, unspecified, uncomplicated: Secondary | ICD-10-CM

## 2013-09-11 MED ORDER — LISINOPRIL 10 MG PO TABS: 10.0000 mg | ORAL_TABLET | Freq: Every day | ORAL | Status: DC

## 2013-09-11 MED ORDER — PRAVASTATIN SODIUM 40 MG PO TABS: 20.0000 mg | ORAL_TABLET | Freq: Every evening | ORAL | Status: DC

## 2013-09-11 MED ORDER — HYDROCHLOROTHIAZIDE 25 MG PO TABS: 25.0000 mg | ORAL_TABLET | Freq: Every day | ORAL | Status: DC

## 2013-09-11 NOTE — Assessment & Plan Note (Signed)
BP Readings from Last 3 Encounters:  09/11/13 156/101  07/20/13 142/93  06/08/13 148/91   Lab Results  Component Value Date   NA 140 03/10/2013   K 4.3 03/10/2013   CREATININE 1.06 03/10/2013   Assessment: Blood pressure control: moderately elevated Progress toward BP goal:  deteriorated Comments: repeat slightly improved but still elevated  Plan: Medications:  d/c prinzide, start lisinopril 10mg  and hctz 25mg  daily. he will likely start these medications after his current prescription of prinzide is finished Educational resources provided:   Self management tools provided:   Other plans: advised to check bp at home, his wife his RN and he will bring values and meds to his follow up visit with Dr. Mikey Bussing.   bmet done today, he really does not like to keep getting blood draws and asks for no more in the future if can be avoided

## 2013-09-11 NOTE — Progress Notes (Signed)
Subjective:   Patient ID: Antonio Ramos male   DOB: 1961/02/08 53 y.o.   MRN: [id number]  HPI: Antonio SlaterMr.Antonio Ramos is a 53 y.o. African American male with PMH of HTN, HL, and PUD presenting to opc today for hypertension follow up visit.  He reports compliance with this medications prinzide 10-12.5mg  qd and has cut down on his smoking to only 6 cigs/day.   He was also started on pravastatin last visit and endorses occasional right shoulder pain after starting the medicine.  The discomfort is intermittent, noted only at night, and resolved when he changes position and hangs his hand low from his bed. He says his pcp told him he may experience some muscle discomfort that is why he is mentioning it. 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    Initial BP 168/100, repeat 156/101. His wife is [employer] and can check his BP at home which I have asked him to do and bring values to his follow up visit.   He reports his Protonix is very expensive. I called [facility] who reports his insurance does not cover it well hence the high cost. Nexium would also be expensive. Prilosec may be more affordable, and most affordable option will be H2 antag.   Past Medical History  Diagnosis Date  . Hypertension   . PUD (peptic ulcer disease)     Diagnosed in 1986, with 3 prior bleeds since that time, most recently in 2004  . Hemorrhagic shock 11/23/2012   Current Outpatient Prescriptions  Medication Sig Dispense Refill  . lisinopril-hydrochlorothiazide (PRINZIDE) 10-12.5 MG per tablet Take 1 tablet by mouth daily.  30 tablet  5  . pravastatin (PRAVACHOL) 40 MG tablet Take 1 tablet (40 mg total) by mouth every evening.  30 tablet  11  . pantoprazole (PROTONIX) 40 MG tablet Take 1 tablet (40 mg total) by mouth daily.  30 tablet  11   No current facility-administered medications for this visit.   Family History  Problem Relation Age of Onset  . Heart attack Father     at age of 55   History    Social History  . Marital Status: Married    Spouse Name: N/A    Number of Children: N/A  . Years of Education: N/A   Social History Main Topics  . Smoking status: Current Some Day Smoker -- 0.20 packs/day for 24 years    Types: Cigarettes  . Smokeless tobacco: Not on file     Comment: Decreased to 4 cigs/day.  . 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 Drug Use: No  . Sexual Activity: Not on file   Other Topics Concern  . Not on file   Social History Narrative   Moved from [place] to [place] in 2009.  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  No PCP.   Review of Systems:  Constitutional:  Denies fever, chills  HEENT:  Denies vision disturbance  Respiratory:  Denies SOB  Cardiovascular:  Denies chest pain  Gastrointestinal:  Denies nausea, vomiting, abdominal pain  Genitourinary:  Denies dysuria  Musculoskeletal:  Occasional right shoulder pain  Skin:  Denies pallor, rash and wound.   Neurological:  Denies headache   Objective:  Physical Exam: Filed Vitals:   09/11/13 1318 09/11/13 1344  BP: 168/100 156/101  Pulse: 76 87  Temp: 97.7 F (36.5 C)   TempSrc: Oral   Height: 6\' 2"  (  1.88 m)   Weight: 201 lb 11.2 oz (91.491 kg)   SpO2: 99%    Vitals reviewed. General: sitting in chair, NAD HEENT: EOMI Cardiac: RRR Pulm: clear to auscultation bilaterally, no wheezes, rales, or rhonchi Abd: soft, nontender, nondistended, BS present Ext: warm and well perfused, no pedal edema, moving all 4 extremities, good ROM of all extremities, no pain with movement, good internal and external rotation of RUE, no point tenderness of R shoulder Neuro: alert and oriented X3, strength and sensation to light touch equal in bilateral upper and lower extremities  Assessment & Plan:  Discussed with Dr. Meredith Pel D/c ppi D/c prinzide, started lisinopril 10 and hctz 25mg  Decreased statin to 20mg  bmet today

## 2013-09-11 NOTE — Assessment & Plan Note (Signed)
Decreased pravastatin to 20mg  daily due to new onset right shoulder pain since starting statin.  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   -pravastatin 20mg  daily

## 2013-09-11 NOTE — Patient Instructions (Addendum)
General Instructions:  Dear Antonio Ramos,   Please stop smoking! You are doing so great, now lets just stop completely!  We have stopped your protonix today. Lets try to keep you off it until you see Dr. Mikey Bussing again, but if you start having heart burn or abdominal pain or chest discomfort let us know right away as you may need to go back on the medicine. Also, if you notice any blood in your sputum, vomit, or stool or urine let us know [phone]  We have changed your blood pressure medicine again today.   Once you finish your current prescription of prinzide combination pills, start taking lisinopril 10mg  daily and hctz 25mg  daily.  These will be two separate medications instead of one combined pill. Try to check your blood pressure daily while at work and record your bp and bring to Dr. Mikey Bussing on next visit  Please cut your pravastatin (cholesterol) medicine in half, so take 20mg  daily instead of 40mg  daily to see if that helps with your right shoulder pain. If it continues, let your pcp know.   Please follow up with Dr. Mikey Bussing on next available between 3-6 months.    Thank you for bringing your medicines today. This helps [place] keep you safe from mistakes.  Progress Toward Treatment Goals:  Treatment Goal 09/11/2013  Blood pressure deteriorated  Stop smoking smoking less    Self Care Goals & Plans:  Self Care Goal 09/11/2013  Manage my medications take my medicines as prescribed  Monitor my health -  Eat healthy foods drink diet soda or water instead of juice or soda; eat more vegetables; eat foods that are low in salt; eat baked foods instead of fried foods  Be physically active take a walk every day  Stop smoking set a quit date and stop smoking    No flowsheet data found.   Care Management & Community Referrals:  Referral 06/08/2013  Referrals made for care management support none needed     Treatment Goals:  Goals (1 Years of Data) as of 09/11/13   None     Lisinopril  tablets What is this medicine? LISINOPRIL (lyse IN oh pril) is an ACE inhibitor. This medicine is used to treat high blood pressure and heart failure. It is also used to protect the heart immediately after a heart attack. This medicine may be used for other purposes; ask your health care provider or pharmacist if you have questions. COMMON BRAND NAME(S): Prinivil, Zestril What should I tell my health care provider before I take this medicine? They need to know if you have any of these conditions: -diabetes -heart or blood vessel disease -immune system disease like lupus or scleroderma -kidney disease -low blood pressure -previous swelling of the tongue, face, or lips with difficulty breathing, difficulty swallowing, hoarseness, or tightening of the throat -an unusual or allergic reaction to lisinopril, other ACE inhibitors, insect venom, foods, dyes, or preservatives -pregnant or trying to get pregnant -breast-feeding How should I use this medicine? Take this medicine by mouth with a glass of water. Follow the directions on your prescription label. You may take this medicine with or without food. Take your medicine at regular intervals. Do not stop taking this medicine except on the advice of your doctor or health care professional. Talk to your pediatrician regarding the use of this medicine in children. Special care may be needed. While this drug may be prescribed for children as young as 14 years of age for selected conditions,  precautions do apply. Overdosage: If you think you have taken too much of this medicine contact a poison control center or emergency room at once. NOTE: This medicine is only for you. Do not share this medicine with others. What if I miss a dose? If you miss a dose, take it as soon as you can. If it is almost time for your next dose, take only that dose. Do not take double or extra doses. What may interact with this medicine? -diuretics -lithium -NSAIDs, medicines  for pain and inflammation, like ibuprofen or naproxen -over-the-counter herbal supplements like hawthorn -potassium salts or potassium supplements -salt substitutes This list may not describe all possible interactions. Give your health care provider a list of all the medicines, herbs, non-prescription drugs, or dietary supplements you use. Also tell them if you smoke, drink alcohol, or use illegal drugs. Some items may interact with your medicine. What should I watch for while using this medicine? Visit your doctor or health care professional for regular check ups. Check your blood pressure as directed. Ask your doctor what your blood pressure should be, and when you should contact him or her. Call your doctor or health care professional if you notice an irregular or fast heart beat. Women should inform their doctor if they wish to become pregnant or think they might be pregnant. There is a potential for serious side effects to an unborn child. Talk to your health care professional or pharmacist for more information. Check with your doctor or health care professional if you get an attack of severe diarrhea, nausea and vomiting, or if you sweat a lot. The loss of too much body fluid can make it dangerous for you to take this medicine. You may get drowsy or dizzy. Do not drive, use machinery, or do anything that needs mental alertness until you know how this drug affects you. Do not stand or sit up quickly, especially if you are an older patient. This reduces the risk of dizzy or fainting spells. Alcohol can make you more drowsy and dizzy. Avoid alcoholic drinks. Avoid salt substitutes unless you are told otherwise by your doctor or health care professional. Do not treat yourself for coughs, colds, or pain while you are taking this medicine without asking your doctor or health care professional for advice. Some ingredients may increase your blood pressure. What side effects may I notice from receiving this  medicine? Side effects that you should report to your doctor or health care professional as soon as possible: -abdominal pain with or without nausea or vomiting -allergic reactions like skin rash or hives, swelling of the hands, feet, face, lips, throat, or tongue -dark urine -difficulty breathing -dizzy, lightheaded or fainting spell -fever or sore throat -irregular heart beat, chest pain -pain or difficulty passing urine -redness, blistering, peeling or loosening of the skin, including inside the mouth -unusually weak -yellowing of the eyes or skin Side effects that usually do not require medical attention (report to your doctor or health care professional if they continue or are bothersome): -change in taste -cough -decreased sexual function or desire -headache -sun sensitivity -tiredness This list may not describe all possible side effects. Call your doctor for medical advice about side effects. You may report side effects to FDA at 1-800-FDA-1088. Where should I keep my medicine? Keep out of the reach of children. Store at room temperature between 15 and 30 degrees C (59 and 86 degrees F). Protect from moisture. Keep container tightly closed. Throw away any unused  medicine after the expiration date. NOTE: This sheet is a summary. It may not cover all possible information. If you have questions about this medicine, talk to your doctor, pharmacist, or health care provider.  2014, Elsevier/Gold Standard. (2007-09-26 17:36:32)  Hydrochlorothiazide, HCTZ capsules or tablets What is this medicine? HYDROCHLOROTHIAZIDE (hye droe klor oh THYE a zide) is a diuretic. It increases the amount of urine passed, which causes the body to lose salt and water. This medicine is used to treat high blood pressure. It is also reduces the swelling and water retention caused by various medical conditions, such as heart, liver, or kidney disease. This medicine may be used for other purposes; ask your  health care provider or pharmacist if you have questions. COMMON BRAND NAME(S): Esidrix, Ezide, HydroDIURIL, Microzide, Oretic, Zide What should I tell my health care provider before I take this medicine? They need to know if you have any of these conditions: -diabetes -gout -immune system problems, like lupus -kidney disease or kidney stones -liver disease -pancreatitis -small amount of urine or difficulty passing urine -an unusual or allergic reaction to hydrochlorothiazide, sulfa drugs, other medicines, foods, dyes, or preservatives -pregnant or trying to get pregnant -breast-feeding How should I use this medicine? Take this medicine by mouth with a glass of water. Follow the directions on the prescription label. Take your medicine at regular intervals. Remember that you will need to pass urine frequently after taking this medicine. Do not take your doses at a time of day that will cause you problems. Do not stop taking your medicine unless your doctor tells you to. Talk to your pediatrician regarding the use of this medicine in children. Special care may be needed. Overdosage: If you think you have taken too much of this medicine contact a poison control center or emergency room at once. NOTE: This medicine is only for you. Do not share this medicine with others. What if I miss a dose? If you miss a dose, take it as soon as you can. If it is almost time for your next dose, take only that dose. Do not take double or extra doses. What may interact with this medicine? -cholestyramine -colestipol -digoxin -dofetilide -lithium -medicines for blood pressure -medicines for diabetes -medicines that relax muscles for surgery -other diuretics -steroid medicines like prednisone or cortisone This list may not describe all possible interactions. Give your health care provider a list of all the medicines, herbs, non-prescription drugs, or dietary supplements you use. Also tell them if you  smoke, drink alcohol, or use illegal drugs. Some items may interact with your medicine. What should I watch for while using this medicine? Visit your doctor or health care professional for regular checks on your progress. Check your blood pressure as directed. Ask your doctor or health care professional what your blood pressure should be and when you should contact him or her. You may need to be on a special diet while taking this medicine. Ask your doctor. Check with your doctor or health care professional if you get an attack of severe diarrhea, nausea and vomiting, or if you sweat a lot. The loss of too much body fluid can make it dangerous for you to take this medicine. You may get drowsy or dizzy. Do not drive, use machinery, or do anything that needs mental alertness until you know how this medicine affects you. Do not stand or sit up quickly, especially if you are an older patient. This reduces the risk of dizzy or fainting spells.  Alcohol may interfere with the effect of this medicine. Avoid alcoholic drinks. This medicine may affect your blood sugar level. If you have diabetes, check with your doctor or health care professional before changing the dose of your diabetic medicine. This medicine can make you more sensitive to the sun. Keep out of the sun. If you cannot avoid being in the sun, wear protective clothing and use sunscreen. Do not use sun lamps or tanning beds/booths. What side effects may I notice from receiving this medicine? Side effects that you should report to your doctor or health care professional as soon as possible: -allergic reactions such as skin rash or itching, hives, swelling of the lips, mouth, tongue, or throat -changes in vision -chest pain -eye pain -fast or irregular heartbeat -feeling faint or lightheaded, falls -gout attack -muscle pain or cramps -pain or difficulty when passing urine -pain, tingling, numbness in the hands or feet -redness, blistering,  peeling or loosening of the skin, including inside the mouth -unusually weak or tired Side effects that usually do not require medical attention (report to your doctor or health care professional if they continue or are bothersome): -change in sex drive or performance -dry mouth -headache -stomach upset This list may not describe all possible side effects. Call your doctor for medical advice about side effects. You may report side effects to FDA at 1-800-FDA-1088. Where should I keep my medicine? Keep out of the reach of children. Store at room temperature between 15 and 30 degrees C (59 and 86 degrees F). Do not freeze. Protect from light and moisture. Keep container closed tightly. Throw away any unused medicine after the expiration date. NOTE: This sheet is a summary. It may not cover all possible information. If you have questions about this medicine, talk to your doctor, pharmacist, or health care provider.  2014, Elsevier/Gold Standard. (2009-11-15 12:57:37)

## 2013-09-11 NOTE — Assessment & Plan Note (Signed)
protonix very expensive >$60/month. Reviewed with Dr. Criselda Peaches. Nexium also not covered by insurance, prilosec may be covered. However, given that it has been almost one year, no more recurrent episodes. Will d/c PPI at this time and monitor. pcp may also consider assessing for H.Pylori clearance on follow up since he will be off PPI by the time of that visit. May need repeat EGD in future if still present or recurrent symptoms. Advised patient to let us know right away if any recurrent bleeding or heart burn or reflux symptoms  -d/c ppi

## 2013-09-11 NOTE — Assessment & Plan Note (Signed)
  Assessment: Progress toward smoking cessation:  smoking less (6 cigs/week) Barriers to progress toward smoking cessation:    Comments: down to 6cigs/week  Plan: Instruction/counseling given:  I counseled patient on the dangers of tobacco use, advised patient to stop smoking, and reviewed strategies to maximize success. Educational resources provided:    Self management tools provided:    Medications to assist with smoking cessation:  None Patient agreed to the following self-care plans for smoking cessation: set a quit date and stop smoking  Other plans: plans to quit, does not want any assistance

## 2013-09-12 LAB — BASIC METABOLIC PANEL WITH GFR
BUN: 12 mg/dL (ref 6–23)
CALCIUM: 9.4 mg/dL (ref 8.4–10.5)
CO2: 28 mEq/L (ref 19–32)
Chloride: 105 mEq/L (ref 96–112)
Creat: 1.04 mg/dL (ref 0.50–1.35)
GFR, Est Non African American: 80 mL/min
GLUCOSE: 81 mg/dL (ref 70–99)
Potassium: 4.2 mEq/L (ref 3.5–5.3)
SODIUM: 141 meq/L (ref 135–145)

## 2013-09-14 NOTE — Progress Notes (Signed)
Case discussed with Dr. Qureshi at the time of the visit.  We reviewed the resident's history and exam and pertinent patient test results.  I agree with the assessment, diagnosis, and plan of care documented in the resident's note. 

## 2013-11-02 ENCOUNTER — Encounter: Payer: BC Managed Care – PPO | Admitting: Internal Medicine

## 2013-11-16 ENCOUNTER — Encounter: Payer: BC Managed Care – PPO | Admitting: Internal Medicine

## 2013-11-16 ENCOUNTER — Encounter: Payer: Self-pay | Admitting: Internal Medicine

## 2013-11-16 ENCOUNTER — Ambulatory Visit (INDEPENDENT_AMBULATORY_CARE_PROVIDER_SITE_OTHER): Payer: BC Managed Care – PPO | Admitting: Internal Medicine

## 2013-11-16 VITALS — BP 135/85

## 2013-11-16 DIAGNOSIS — F17200 Nicotine dependence, unspecified, uncomplicated: Secondary | ICD-10-CM

## 2013-11-16 DIAGNOSIS — Z720 Tobacco use: Secondary | ICD-10-CM

## 2013-11-16 DIAGNOSIS — I10 Essential (primary) hypertension: Secondary | ICD-10-CM

## 2013-11-16 DIAGNOSIS — K279 Peptic ulcer, site unspecified, unspecified as acute or chronic, without hemorrhage or perforation: Secondary | ICD-10-CM

## 2013-11-16 DIAGNOSIS — Z9189 Other specified personal risk factors, not elsewhere classified: Secondary | ICD-10-CM

## 2013-11-16 DIAGNOSIS — Z789 Other specified health status: Secondary | ICD-10-CM

## 2013-11-16 NOTE — Assessment & Plan Note (Signed)
Doing well off protonix, was encouraged to call and return for visit sooner if develops heartburn, melena, or grossly bloody stools given his recurrent history of PUD with significant GI bleeds.

## 2013-11-16 NOTE — Assessment & Plan Note (Addendum)
BP Readings from Last 3 Encounters:  11/16/13 135/85  09/11/13 156/101  07/20/13 142/93    Lab Results  Component Value Date   NA 141 09/11/2013   K 4.2 09/11/2013   CREATININE 1.04 09/11/2013    Assessment: Blood pressure control: controlled Progress toward BP goal:  at goal Comments: BP 135/85 today  Plan: Medications:  Lisinopril 10mg  daily, HCTZ 25 mg daily Educational resources provided:   Self management tools provided:   Other plans: Continue currents meds will follow up in 6 months

## 2013-11-16 NOTE — Progress Notes (Signed)
[facility] Subjective:   Patient ID: Antonio SorrowCalvin Ramos male   DOB: [birth year] 53 y.o.   MRN: [id number]  HPI: Antonio SlaterMr.[name] is a 53 y.o. male with a PMH significant for HTN, PUD due to H pylori. He presents today for regular follow up.   He reports compliance with Lisinopril 10mg  and HCTZ 25 mg.  Denies any side effects. Does not want a combination pill at this time.  He takes a statin for his cardiac risk score. At his last visit he was having some shoulder pain and his pravastatin was reduced from 40mg  to 20 mg.  This has improved his shoulder pain (which happens at night).  He notes however that if he takes the pravastatin in the morning he has no shoulder pain at all at night.  He would like to take pravastatin in the morning.  At his last visit he noted trouble affording protonix.  He has now been off of this medication for about 2 months and denies any heartburn, blood in stool or melena.  He will let me know if symptoms return.  Past Medical History  Diagnosis Date  . Hypertension   . PUD (peptic ulcer disease)     Diagnosed in 1986, with 3 prior bleeds since that time, most recently in 2004  . Hemorrhagic shock 11/23/2012   Current Outpatient Prescriptions  Medication Sig Dispense Refill  . hydrochlorothiazide (HYDRODIURIL) 25 MG tablet Take 1 tablet (25 mg total) by mouth daily.  30 tablet  3  . lisinopril (PRINIVIL,ZESTRIL) 10 MG tablet Take 1 tablet (10 mg total) by mouth daily.  30 tablet  3  . pravastatin (PRAVACHOL) 40 MG tablet Take 0.5 tablets (20 mg total) by mouth every evening.  30 tablet  11   No current facility-administered medications for this visit.   Family History  Problem Relation Age of Onset  . Heart attack Father     at age of 55   History   Social History  . Marital Status: Married    Spouse Name: N/A    Number of Children: N/A  . Years of Education: N/A   Social History Main Topics  . Smoking status: Current Some Day Smoker  -- 0.20 packs/day for 24 years    Types: Cigarettes  . Smokeless tobacco: Not on file     Comment: Decreased to 6 cigs/week  . 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 Drug Use: No  . Sexual Activity: Not on file   Other Topics Concern  . Not on file   Social History Narrative   [place]oved from [place] to [place] in 2009.  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  No PCP.   Review of Systems: Review of Systems  Constitutional: Negative for fever and weight loss.  Respiratory: Negative for cough and shortness of breath.   Cardiovascular: Negative for chest pain.  Gastrointestinal: Negative for heartburn, diarrhea and blood in stool.  Genitourinary: Negative for dysuria.  Musculoskeletal: Positive for myalgias (shoulder pain at night if takes pravastatin at night).  Neurological: Negative for dizziness and headaches.  Psychiatric/Behavioral: Negative for depression.     Objective:  Physical Exam: There were no vitals filed for this visit. Physical Exam  Nursing note and vitals reviewed. Constitutional: He is well-developed, well-nourished, and in no distress.  HENT:  Head: Normocephalic and atraumatic.  Cardiovascular: Normal rate, regular rhythm, normal heart sounds and intact distal pulses.  No murmur heard. Pulmonary/Chest: Effort normal and breath sounds normal. He has no wheezes.  Abdominal: Soft. Bowel sounds are normal.  Musculoskeletal: He exhibits no edema.  Skin: Skin is warm and dry.     Assessment & Plan:  Case discussed with Dr. Heide Spark See Problem Based Assessment and Plan Medications Ordered No orders of the defined types were placed in this encounter.   Other Orders No orders of the defined types were placed in this encounter.

## 2013-11-16 NOTE — Assessment & Plan Note (Signed)
Discussed changing to a different statin given his ongoing mild shoulder pain at night however he reports is able to tolerate pravastatin 20mg  daily if he takes in the morning and would like to stay on this medication.    -Continue Pravastatin 20mg  daily (in AM) - Consider repeating Lipid panel at next blood draw (determine effect of statin)

## 2013-11-16 NOTE — Patient Instructions (Signed)
General Instructions:   Please bring your medicines with you each time you come to clinic.  Medicines may include prescription medications, over-the-counter medications, herbal remedies, eye drops, vitamins, or other pills.   Progress Toward Treatment Goals:  Treatment Goal 11/16/2013  Blood pressure at goal  Stop smoking smoking less    Self Care Goals & Plans:  Self Care Goal 11/16/2013  Manage my medications take my medicines as prescribed  Monitor my health keep track of my blood pressure  Eat healthy foods drink diet soda or water instead of juice or soda; eat more vegetables; eat foods that are low in salt; eat baked foods instead of fried foods  Be physically active take a walk every day  Stop smoking set a quit date and stop smoking    No flowsheet data found.   Care Management & Community Referrals:  Referral 11/16/2013  Referrals made for care management support none needed

## 2013-11-16 NOTE — Assessment & Plan Note (Signed)
  Assessment: Progress toward smoking cessation:  smoking less Barriers to progress toward smoking cessation:  stress Comments: Down to 9-10 cigs a week  Plan: Instruction/counseling given:  I counseled patient on the dangers of tobacco use, advised patient to stop smoking, and reviewed strategies to maximize success. Educational resources provided:    Self management tools provided:    Medications to assist with smoking cessation:  None Patient agreed to the following self-care plans for smoking cessation: set a quit date and stop smoking  Other plans: Patient reiterates desire to quit on his own, currently stress at work is holding him back from quitting altogether.

## 2013-11-17 NOTE — Progress Notes (Signed)
INTERNAL MEDICINE TEACHING ATTENDING ADDENDUM - Antonio Ngu, MD: I reviewed and discussed at the time of visit with the resident Dr. Hoffman, the patient's medical history, physical examination, diagnosis and results of pertinent tests and treatment and I agree with the patient's care as documented.  

## 2014-02-01 ENCOUNTER — Ambulatory Visit (INDEPENDENT_AMBULATORY_CARE_PROVIDER_SITE_OTHER): Payer: BC Managed Care – PPO | Admitting: Internal Medicine

## 2014-02-01 ENCOUNTER — Encounter: Payer: Self-pay | Admitting: Internal Medicine

## 2014-02-01 VITALS — BP 167/106 | HR 83 | Temp 98.0°F | Ht 74.0 in | Wt 205.5 lb

## 2014-02-01 DIAGNOSIS — Z9189 Other specified personal risk factors, not elsewhere classified: Secondary | ICD-10-CM

## 2014-02-01 DIAGNOSIS — J069 Acute upper respiratory infection, unspecified: Secondary | ICD-10-CM

## 2014-02-01 DIAGNOSIS — K279 Peptic ulcer, site unspecified, unspecified as acute or chronic, without hemorrhage or perforation: Secondary | ICD-10-CM

## 2014-02-01 DIAGNOSIS — Z79899 Other long term (current) drug therapy: Secondary | ICD-10-CM

## 2014-02-01 DIAGNOSIS — T466X5A Adverse effect of antihyperlipidemic and antiarteriosclerotic drugs, initial encounter: Secondary | ICD-10-CM

## 2014-02-01 DIAGNOSIS — I10 Essential (primary) hypertension: Secondary | ICD-10-CM

## 2014-02-01 DIAGNOSIS — G720 Drug-induced myopathy: Secondary | ICD-10-CM | POA: Insufficient documentation

## 2014-02-01 DIAGNOSIS — M25519 Pain in unspecified shoulder: Secondary | ICD-10-CM

## 2014-02-01 LAB — COMPLETE METABOLIC PANEL WITH GFR
ALK PHOS: 70 U/L (ref 39–117)
ALT: 21 U/L (ref 0–53)
AST: 21 U/L (ref 0–37)
Albumin: 3.9 g/dL (ref 3.5–5.2)
BILIRUBIN TOTAL: 0.2 mg/dL — AB (ref 0.3–1.2)
BUN: 14 mg/dL (ref 6–23)
CO2: 29 meq/L (ref 19–32)
CREATININE: 1.18 mg/dL (ref 0.50–1.35)
Calcium: 9.7 mg/dL (ref 8.4–10.5)
Chloride: 101 mEq/L (ref 96–112)
GFR, EST AFRICAN AMERICAN: 80 mL/min
GFR, EST NON AFRICAN AMERICAN: 69 mL/min
Glucose, Bld: 112 mg/dL — ABNORMAL HIGH (ref 70–99)
Potassium: 4.4 mEq/L (ref 3.5–5.3)
SODIUM: 141 meq/L (ref 135–145)
TOTAL PROTEIN: 7.2 g/dL (ref 6.0–8.3)

## 2014-02-01 LAB — CK: Total CK: 530 U/L — ABNORMAL HIGH (ref 7–232)

## 2014-02-01 MED ORDER — LISINOPRIL 10 MG PO TABS: 10.0000 mg | ORAL_TABLET | Freq: Every day | ORAL | Status: DC

## 2014-02-01 MED ORDER — HYDROCHLOROTHIAZIDE 25 MG PO TABS: 25.0000 mg | ORAL_TABLET | Freq: Every day | ORAL | Status: DC

## 2014-02-01 NOTE — Assessment & Plan Note (Signed)
-  Paitent continues to have some bilateral shoulder pain after starting statin. - Will check LFTs with CMP and a CK. -Will do trial off Pravasatin.

## 2014-02-01 NOTE — Progress Notes (Signed)
[facility] Subjective:   Patient ID: Antonio Ramos male   DOB: 1960/07/06 53 y.o.   MRN: [id number]  HPI: Antonio Ramos is a 53 y.o. 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  He does not know of any sick contacts at work but his wife is sick at home.  He thinks that he caught this could because he was working in the rain on Tuesday.  Bilateral shoulder pain Works in Engineering geologist tires all day.  But notes that this started after starting on Pravastatin, he has not tried stopping pravastatin.  Past Medical History  Diagnosis Date  . Hypertension   . PUD (peptic ulcer disease)     Diagnosed in 1986, with 3 prior bleeds since that time, most recently in 2004  . Hemorrhagic shock 11/23/2012   Current Outpatient Prescriptions  Medication Sig Dispense Refill  . hydrochlorothiazide (HYDRODIURIL) 25 MG tablet Take 1 tablet (25 mg total) by mouth daily.  30 tablet  3  . lisinopril (PRINIVIL,ZESTRIL) 10 MG tablet Take 1 tablet (10 mg total) by mouth daily.  30 tablet  3  . pravastatin (PRAVACHOL) 40 MG tablet Take 0.5 tablets (20 mg total) by mouth every evening.  30 tablet  11   No current facility-administered medications for this visit.   Family History  Problem Relation Age of Onset  . Heart attack Father     at age of 70   History   Social History  . Marital Status: Married    Spouse Name: N/A    Number of Children: N/A  . Years of Education: N/A   Social History Main Topics  . Smoking status: Current Some Day Smoker -- 0.20 packs/day for 24 years    Types: Cigarettes  . Smokeless tobacco: None     Comment: Decreased to 2 cigs/week  . 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 Drug Use: No  . Sexual Activity: None   Other Topics Concern  . None   Social History Narrative   Moved from [place] to [place] in 2009.   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   Review of Systems: Review of Systems  Constitutional: Negative for fever, chills, weight loss and malaise/fatigue.  HENT: Positive for congestion and sore throat. Negative for ear pain and hearing loss.   Eyes: Negative for blurred vision.  Respiratory: Positive for cough. Negative for sputum production, shortness of breath, wheezing and stridor.   Cardiovascular: Negative for chest pain and leg swelling.  Gastrointestinal: Negative for heartburn and abdominal pain.  Musculoskeletal: Positive for joint pain (bilateral shoulders). Negative for myalgias.  Neurological: Negative for dizziness and headaches.  Psychiatric/Behavioral: Negative for depression and suicidal ideas.     Objective:  Physical Exam: Filed Vitals:   02/01/14 1615  BP: 167/106  Pulse: 83  Temp: 98 F (36.7 C)  TempSrc: Oral  Height: 6\' 2"  (1.88 m)  Weight: 205 lb 8 oz (93.214 kg)  SpO2: 100%  Physical Exam  Nursing note and vitals reviewed. Constitutional: He is well-developed, well-nourished, and in no distress. No distress.  HENT:  Head: Normocephalic and atraumatic.  Mouth/Throat: Oropharynx is clear and moist.  Neck: Normal range of motion. Neck supple.  Cardiovascular: Normal rate, regular rhythm and normal heart sounds.   No murmur heard. Pulmonary/Chest: Effort normal  and breath sounds normal.  Abdominal: Soft. Bowel sounds are normal.  Musculoskeletal: He exhibits no edema.  Full active ROM of bilateral shoulder, NEERs test negative, no biceptial grove tenderness, mild bilateral tenderness over AC joint.  Lymphadenopathy:    He has no cervical adenopathy.  Skin: Skin is warm and dry.     Assessment & Plan:  Case discussed with Dr. Josem KaufmannKlima See Problem Based Assessment and Plan Medications Ordered Meds ordered this encounter  Medications  . hydrochlorothiazide (HYDRODIURIL) 25 MG tablet    Sig: Take 1 tablet (25 mg total) by mouth  daily.    Dispense:  30 tablet    Refill:  11  . lisinopril (PRINIVIL,ZESTRIL) 10 MG tablet    Sig: Take 1 tablet (10 mg total) by mouth daily.    Dispense:  30 tablet    Refill:  11   Other Orders Orders Placed This Encounter  Procedures  . CMP with Estimated GFR ([id number])  . CK, total

## 2014-02-01 NOTE — Assessment & Plan Note (Signed)
-  Patient continues to do well without heartburn off of protonix.

## 2014-02-01 NOTE — Assessment & Plan Note (Signed)
His symptoms are consistent with an acute viral URI.  I instructed him on supportive care and to follow up in 1 week if not better.  I have given him a letter for work to remain off until Sunday.

## 2014-02-01 NOTE — Assessment & Plan Note (Signed)
BP Readings from Last 3 Encounters:  02/01/14 167/106  11/16/13 135/85  09/11/13 156/101    Lab Results  Component Value Date   NA 141 09/11/2013   K 4.2 09/11/2013   CREATININE 1.04 09/11/2013    Assessment: Blood pressure control: moderately elevated Progress toward BP goal:  deteriorated Comments: patient acutely sick and brings in empty pill bottles  Plan: Medications: Lisinopril 10mg , HCTZ 25mg  daily Educational resources provided:   Self management tools provided:   Other plans: Will have patient resume his BP medications, instructed to call pharmacy for refills and to not go without.

## 2014-02-01 NOTE — Assessment & Plan Note (Signed)
Patient complaining of bilateral shoulder pain after starting pravastatin. -Check CMP, CK. -Trial off Pravastatin

## 2014-02-01 NOTE — Patient Instructions (Signed)
General Instructions: 1. Drink plenty of fluid and rest well. 2. Please take OTC Tylenol for fever and pain 3. Take OTC cough drop 4. Please take OTC Robitussin 5 ml oral every 4- 6 hours as needed 5. Call the clinic if you have fever, SOB, worsening of cough, sputum or other current symptoms.  6. Follow up in 7-10 days if not better.   STOP Taking Pravastatin to see if you shoulder soreness improves.  Thank you for bringing your medicines today. This helps [place] keep you safe from mistakes.   Progress Toward Treatment Goals:  Treatment Goal 02/01/2014  Blood pressure deteriorated  Stop smoking -    Self Care Goals & Plans:  Self Care Goal 02/01/2014  Manage my medications take my medicines as prescribed; bring my medications to every visit; refill my medications on time  Monitor my health -  Eat healthy foods eat more vegetables; eat foods that are low in salt; eat baked foods instead of fried foods  Be physically active take a walk every day  Stop smoking set a quit date and stop smoking    No flowsheet data found.   Care Management & Community Referrals:  Referral 02/01/2014  Referrals made for care management support none needed

## 2014-02-04 ENCOUNTER — Emergency Department (HOSPITAL_COMMUNITY): Payer: BC Managed Care – PPO

## 2014-02-04 ENCOUNTER — Encounter (HOSPITAL_COMMUNITY): Payer: Self-pay | Admitting: *Deleted

## 2014-02-04 ENCOUNTER — Emergency Department (HOSPITAL_COMMUNITY)
Admission: EM | Admit: 2014-02-04 | Discharge: 2014-02-04 | Disposition: A | Discharge status: Home / Self Care | Payer: BC Managed Care – PPO | Attending: Emergency Medicine | Admitting: Emergency Medicine

## 2014-02-04 DIAGNOSIS — J069 Acute upper respiratory infection, unspecified: Secondary | ICD-10-CM | POA: Insufficient documentation

## 2014-02-04 DIAGNOSIS — R0789 Other chest pain: Secondary | ICD-10-CM | POA: Insufficient documentation

## 2014-02-04 DIAGNOSIS — I10 Essential (primary) hypertension: Secondary | ICD-10-CM | POA: Diagnosis not present

## 2014-02-04 DIAGNOSIS — R059 Cough, unspecified: Secondary | ICD-10-CM

## 2014-02-04 DIAGNOSIS — R05 Cough: Secondary | ICD-10-CM

## 2014-02-04 DIAGNOSIS — Z8711 Personal history of peptic ulcer disease: Secondary | ICD-10-CM | POA: Insufficient documentation

## 2014-02-04 DIAGNOSIS — Z720 Tobacco use: Secondary | ICD-10-CM | POA: Insufficient documentation

## 2014-02-04 DIAGNOSIS — Z79899 Other long term (current) drug therapy: Secondary | ICD-10-CM | POA: Insufficient documentation

## 2014-02-04 DIAGNOSIS — E785 Hyperlipidemia, unspecified: Secondary | ICD-10-CM | POA: Insufficient documentation

## 2014-02-04 MED ORDER — ALBUTEROL SULFATE HFA 108 (90 BASE) MCG/ACT IN AERS
1.0000 | INHALATION_SPRAY | Freq: Four times a day (QID) | RESPIRATORY_TRACT | Status: DC | PRN
Start: 2014-02-04 — End: 2021-07-06

## 2014-02-04 NOTE — ED Notes (Signed)
The pt arrived 49. Notes started then,, down  time

## 2014-02-04 NOTE — ED Provider Notes (Signed)
CSN: [id number]     Arrival date & time 02/04/14  0325 History   First MD Initiated Contact with Patient 02/04/14 0604     Chief Complaint  Patient presents with  . Cough     (Consider location/radiation/quality/duration/timing/severity/associated sxs/prior Treatment) HPI  Antonio Ramos is a 53 y.o. male with PMH of HTN, PUD presenting with cough productive of thin green sputum for 6 days. No blood. Pt with tactile fevers, chills, rhinorrhea, congestion. Pt also with chest tightness after coughing which started last night. Pain is diffusely in chest. Not worse with activity or deep breathing. Tightness only occurs after prolonged coughing and resolves after. Pt with out history of chest pain. Pt presented to PCP 3 days ago, diagnosed with URI. Pt with history of HTN and hyperlipidemia. No history of CAD. No history of DVT, PE, unilateral leg swelling or malignancy. Pt smoker.    Past Medical History  Diagnosis Date  . Hypertension   . PUD (peptic ulcer disease)     Diagnosed in 1986, with 3 prior bleeds since that time, most recently in 2004  . Hemorrhagic shock 11/23/2012   Past Surgical History  Procedure Laterality Date  . Upper gi endoscopy    . Esophagogastroduodenoscopy N/A 11/23/2012    Procedure: ESOPHAGOGASTRODUODENOSCOPY (EGD);  Surgeon: Petra KubaMarc E Magod, MD;  [facility];  Service: Endoscopy;  Laterality: N/A;   Family History  Problem Relation Age of Onset  . Heart attack Father     at age of 55   History  Substance Use Topics  . Smoking status: Current Some Day Smoker -- 0.20 packs/day for 24 years    Types: Cigarettes  . Smokeless tobacco: Not on file     Comment: Decreased to 2 cigs/week  . Alcohol Use: No     Comment: states used to drink heavily in 1983    Review of Systems  Constitutional: Positive for fever and chills.  HENT: Positive for congestion and rhinorrhea.   Eyes: Negative for visual disturbance.  Respiratory: Positive for cough. Negative  for shortness of breath.   Cardiovascular: Negative for chest pain and palpitations.  Gastrointestinal: Negative for nausea, vomiting and diarrhea.  Genitourinary: Negative for dysuria and hematuria.  Musculoskeletal: Negative for back pain and gait problem.  Skin: Negative for rash.  Neurological: Negative for weakness and headaches.      Allergies  Review of patient's allergies indicates no known allergies.  Home Medications   Prior to Admission medications   Medication Sig Start Date End Date Taking? Authorizing Provider  albuterol (PROVENTIL HFA;VENTOLIN HFA) 108 (90 BASE) MCG/ACT inhaler Inhale 1-2 puffs into the lungs every 6 (six) hours as needed for wheezing or shortness of breath. 02/04/14   Enid SkeensJoshua M Zavitz, MD  hydrochlorothiazide (HYDRODIURIL) 25 MG tablet Take 1 tablet (25 mg total) by mouth daily. 02/01/14   Gust RungErik C Hoffman, DO  lisinopril (PRINIVIL,ZESTRIL) 10 MG tablet Take 1 tablet (10 mg total) by mouth daily. 02/01/14   Gust RungErik C Hoffman, DO  pravastatin (PRAVACHOL) 40 MG tablet Take 0.5 tablets (20 mg total) by mouth every evening. 09/11/13 09/11/14  Baltazar ApoSamaya J Qureshi, MD   BP 130/94 mmHg  Pulse 72  Temp(Src) 98.5 F (36.9 C) (Oral)  Resp 20  Ht 6\' 2"  (1.88 m)  Wt 205 lb (92.987 kg)  BMI 26.31 kg/m2  SpO2 91% Physical Exam  Constitutional: He appears well-developed and well-nourished. No distress.  HENT:  Head: Normocephalic and atraumatic.  Nose: Right sinus exhibits no maxillary  sinus tenderness and no frontal sinus tenderness. Left sinus exhibits no maxillary sinus tenderness and no frontal sinus tenderness.  Mouth/Throat: Mucous membranes are normal. Posterior oropharyngeal erythema present. No oropharyngeal exudate or posterior oropharyngeal edema.  Eyes: Conjunctivae and EOM are normal. Right eye exhibits no discharge. Left eye exhibits no discharge.  Neck: Normal range of motion. Neck supple.  Cardiovascular: Normal rate, regular rhythm and normal heart sounds.    Pulmonary/Chest: Effort normal and breath sounds normal. No respiratory distress. He has no wheezes. He has no rales.  Abdominal: Soft. Bowel sounds are normal. He exhibits no distension. There is no tenderness.  Lymphadenopathy:    He has no cervical adenopathy.  Neurological: He is alert.  Skin: Skin is warm and dry. He is not diaphoretic.  Nursing note and vitals reviewed.   ED Course  Procedures (including critical care time) Labs Review Labs Reviewed - No data to display  Imaging Review Dg Chest 2 View  02/04/2014   CLINICAL DATA:  Initial evaluation for sore throat, cough, chest pain for 3 days.  EXAM: CHEST  2 VIEW  COMPARISON:  Prior study from 11/23/2012  FINDINGS: The cardiac and mediastinal silhouettes are stable in size and contour, and remain within normal limits.  The lungs are normally inflated. No airspace consolidation, pleural effusion, or pulmonary edema is identified. There is no pneumothorax.  No acute osseous abnormality identified.  IMPRESSION: No active cardiopulmonary disease.   Electronically Signed   By: Rise Mu M.D.   On: 02/04/2014 06:55     EKG Interpretation None      Meds given in ED:  Medications - No data to display  Discharge Medication List as of 02/04/2014  8:12 AM    START taking these medications   Details  albuterol (PROVENTIL HFA;VENTOLIN HFA) 108 (90 BASE) MCG/ACT inhaler Inhale 1-2 puffs into the lungs every 6 (six) hours as needed for wheezing or shortness of breath., Starting 02/04/2014, Until Discontinued, Print          MDM   Final diagnoses:  Chest tightness  URI (upper respiratory infection)   Pt with symptoms of URI for 6 days presenting with one day history of chest tightness after coughing which resolves after coughing. Pt smoker. CXR without infiltrates. Most likely the chest tightness is due to prolonged coughing in the setting of URI. I doubt chest tightness due to ACS. Pt to follow up with his PCP in 2  days. Strict return precautions provided if chest tightness becomes a pressure, worse with exertion, associated with nausea, vomiting, diaphoresis or SOB. Script for albuterol.   Discussed return precautions with patient. Discussed all results and patient verbalizes understanding and agrees with plan.  This is a shared patient. This patient was discussed with the physician who saw and evaluated the patient and agrees with the plan.     Louann Sjogren, PA-C 02/04/14 [phone]

## 2014-02-04 NOTE — ED Notes (Signed)
The pt has had a cough since Wednesday  His chest hurts to cough.  Productive white sputum.  unklnown temp

## 2014-02-04 NOTE — ED Notes (Signed)
PA at BS.  

## 2014-02-04 NOTE — Discharge Instructions (Signed)
Follow-up closely with her primary care doctor as discussed. Return the ER if you chest pain occurs at any other time other than coughing, if he becomes short of breath, sweaty any exertional symptoms or new concerns.  If you were given medicines take as directed.  If you are on coumadin or contraceptives realize their levels and effectiveness is altered by many different medicines.  If you have any reaction (rash, tongues swelling, other) to the medicines stop taking and see a physician.   Please follow up as directed and return to the ER or see a physician for new or worsening symptoms.  Thank you. Filed Vitals:   02/04/14 0603 02/04/14 0615 02/04/14 0630 02/04/14 0645  BP: 144/95 136/91 130/94 131/92  Pulse: 80 78 78 77  Temp:      TempSrc:      Resp: 22 26 18 22   Height: 6\' 2"  (1.88 m)     Weight: 205 lb (92.987 kg)     SpO2: 92% 90%

## 2014-02-06 ENCOUNTER — Encounter: Payer: Self-pay | Admitting: Internal Medicine

## 2014-02-06 ENCOUNTER — Ambulatory Visit (INDEPENDENT_AMBULATORY_CARE_PROVIDER_SITE_OTHER): Payer: BC Managed Care – PPO | Admitting: Internal Medicine

## 2014-02-06 VITALS — BP 144/96 | HR 89 | Temp 98.4°F | Ht 74.0 in | Wt 200.7 lb

## 2014-02-06 DIAGNOSIS — I10 Essential (primary) hypertension: Secondary | ICD-10-CM | POA: Diagnosis not present

## 2014-02-06 DIAGNOSIS — G7289 Other specified myopathies: Secondary | ICD-10-CM

## 2014-02-06 DIAGNOSIS — T466X5A Adverse effect of antihyperlipidemic and antiarteriosclerotic drugs, initial encounter: Principal | ICD-10-CM

## 2014-02-06 DIAGNOSIS — J069 Acute upper respiratory infection, unspecified: Secondary | ICD-10-CM

## 2014-02-06 DIAGNOSIS — G720 Drug-induced myopathy: Secondary | ICD-10-CM

## 2014-02-06 DIAGNOSIS — Z720 Tobacco use: Secondary | ICD-10-CM | POA: Diagnosis not present

## 2014-02-06 NOTE — Patient Instructions (Addendum)
General Instructions:  Please bring your medicines with you each time you come to clinic.  Medicines may include prescription medications, over-the-counter medications, herbal remedies, eye drops, vitamins, or other pills.  Dear Antonio Ramos,  Please return in 2 weeks for your blood pressure recheck, we will also check your CK at that time  You can try robitussin DM for your cough  Please stop smoking  PLEASE STOP PRAVASTATIN  Progress Toward Treatment Goals:  Treatment Goal 02/06/2014  Blood pressure improved  Stop smoking smoking the same amount    Self Care Goals & Plans:  Self Care Goal 02/06/2014  Manage my medications take my medicines as prescribed; bring my medications to every visit; refill my medications on time  Monitor my health -  Eat healthy foods eat more vegetables; eat foods that are low in salt; eat baked foods instead of fried foods  Be physically active find an activity I enjoy; take a walk every day  Stop smoking set a quit date and stop smoking    No flowsheet data found.   Care Management & Community Referrals:  Referral 02/01/2014  Referrals made for care management support none needed

## 2014-02-06 NOTE — Assessment & Plan Note (Signed)
Resolving. Did go to ED 11/1 for chest tightness with cough that has since then resolved. Cough also improving, now intermittent with occasional sputum. CXR negative for consolidation of effusion.   Try robitussin DM for cough relief

## 2014-02-06 NOTE — Assessment & Plan Note (Signed)
  Assessment: Progress toward smoking cessation:  smoking the same amount Barriers to progress toward smoking cessation:    Comments: recommended cessation again, he says he is trying  Plan: Instruction/counseling given:  I counseled patient on the dangers of tobacco use, advised patient to stop smoking, and reviewed strategies to maximize success. Educational resources provided:    Self management tools provided:    Medications to assist with smoking cessation:  None Patient agreed to the following self-care plans for smoking cessation: set a quit date and stop smoking  Other plans: consider discussion of NRT if he wishes

## 2014-02-06 NOTE — Progress Notes (Signed)
Subjective:   Patient ID: Antonio Ramos male   DOB: October 15, 1960 53 y.o.   MRN: [id number]  HPI: [name] is a 53 y.o. male with HTN and PUD presenting to opc today for ED follow up visit and to recheck his elevated CK from prior visit with PCP last week.   Elevated CK--b/l shoulder pain started after statin, saw PCP 02/01/14. CK was checked, noted to be elevated at 530. Of note, he was on pravastatin at that time which has since then been discontinued. Advised to drink plenty of water which he says he has been doing.   HTN--BP improved today but still elevated. Restarted ACEI and HCTZ after PCP visit last week but did not take today. He usually takes it around noon.   Viral URI--improving. Now intermittent productive cough that is improving. No more chest pain (tightness when he went to the ED 11/1 with cough) and no more sore throat. We discussed trying robitussin for cough. Denies SOB.   Past Medical History  Diagnosis Date  . Hypertension   . PUD (peptic ulcer disease)     Diagnosed in 1986, with 3 prior bleeds since that time, most recently in 2004  . Hemorrhagic shock 11/23/2012   Current Outpatient Prescriptions  Medication Sig Dispense Refill  . albuterol (PROVENTIL HFA;VENTOLIN HFA) 108 (90 BASE) MCG/ACT inhaler Inhale 1-2 puffs into the lungs every 6 (six) hours as needed for wheezing or shortness of breath. 1 Inhaler 0  . hydrochlorothiazide (HYDRODIURIL) 25 MG tablet Take 1 tablet (25 mg total) by mouth daily. 30 tablet 11  . lisinopril (PRINIVIL,ZESTRIL) 10 MG tablet Take 1 tablet (10 mg total) by mouth daily. 30 tablet 11  . pravastatin (PRAVACHOL) 40 MG tablet Take 0.5 tablets (20 mg total) by mouth every evening. 30 tablet 11   No current facility-administered medications for this visit.   Family History  Problem Relation Age of Onset  . Heart attack Father     at age of 55   History   Social History  . Marital Status: Married    Spouse Name: N/A    Number of  Children: N/A  . Years of Education: N/A   Social History Main Topics  . Smoking status: Current Some Day Smoker -- 0.20 packs/day for 24 years    Types: Cigarettes  . Smokeless tobacco: Not on file     Comment: Decreased to 2 cigs/week  . 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 Drug Use: No  . Sexual Activity: Not on file   Other Topics Concern  . Not on file   Social History Narrative   [place].  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  No PCP.   Review of Systems:  Constitutional:  Denies fever, chills  HEENT:  Denies neck pain   Respiratory:  Productive cough  Cardiovascular:  Denies chest pain   Gastrointestinal:  Denies nausea, vomiting, abdominal pain  Musculoskeletal:  Improved shoulder pain  Skin:  Denies wound.    Objective:  Physical Exam: Filed Vitals:   02/06/14 0840  BP: 144/96  Pulse: 89  Temp: 98.4 F (36.9 C)  TempSrc: Oral  Height: 6\' 2"  (1.88 m)  Weight: 200 lb 11.2 oz (91.037 kg)  SpO2: 98%   Vitals reviewed. General: sitting in chair, NAD HEENT: EOMI Cardiac: RRR Pulm: clear to auscultation bilaterally, no wheezes, rales, or rhonchi Abd: soft, BS present  Ext: moving all extremities, shrugging shoulders without complaints, no tenderness to palpation Neuro: alert and oriented X3,strength equal in bilateral upper and lower extremities  Assessment & Plan:  Discussed with Dr. Criselda Peaches RTC 2 weeks recheck BP and CK

## 2014-02-06 NOTE — Assessment & Plan Note (Addendum)
B/l shoulder pain thought to be secondary to statin, although he was on pravastatin. Since stopping last week, shoulder pain has resolved. CK elevated to 500s. Advised to drink plenty of water and will recheck in 2 weeks and also recheck bmet to monitor renal function

## 2014-02-06 NOTE — Assessment & Plan Note (Addendum)
BP Readings from Last 3 Encounters:  02/06/14 144/96  02/04/14 130/94  02/01/14 167/106   Lab Results  Component Value Date   NA 141 02/01/2014   K 4.4 02/01/2014   CREATININE 1.18 02/01/2014   Assessment: Blood pressure control: moderately elevated on repeat 130/113 (moving and talking during measurement) Progress toward BP goal:  improved Comments: did not take his medications today and just restarted 10/29  Plan: Medications:  continue current medications lisinopril 10mg  and hctz 25mg  Educational resources provided:   Self management tools provided:   Other plans: will given some more time for medications and  recheck in 2 weeks. If still elevated, would increase ACEi. Monitor renal function as well, recheck bmet on next visit

## 2014-02-07 NOTE — Progress Notes (Signed)
Case discussed with Dr. Hoffman soon after the resident saw the patient.  We reviewed the resident's history and exam and pertinent patient test results.  I agree with the assessment, diagnosis and plan of care documented in the resident's note. 

## 2014-02-11 NOTE — Progress Notes (Signed)
Internal Medicine Clinic Attending  Case discussed with Dr. Qureshi soon after the resident saw the patient.  We reviewed the resident's history and exam and pertinent patient test results.  I agree with the assessment, diagnosis, and plan of care documented in the resident's note. 

## 2014-02-22 ENCOUNTER — Encounter: Payer: BC Managed Care – PPO | Admitting: Internal Medicine

## 2014-03-08 ENCOUNTER — Encounter: Payer: Self-pay | Admitting: Internal Medicine

## 2014-03-08 ENCOUNTER — Ambulatory Visit (INDEPENDENT_AMBULATORY_CARE_PROVIDER_SITE_OTHER): Payer: BC Managed Care – PPO | Admitting: Internal Medicine

## 2014-03-08 VITALS — BP 151/98 | HR 80 | Temp 98.0°F | Ht 74.0 in | Wt 205.5 lb

## 2014-03-08 DIAGNOSIS — Z9189 Other specified personal risk factors, not elsewhere classified: Secondary | ICD-10-CM | POA: Diagnosis not present

## 2014-03-08 DIAGNOSIS — Z720 Tobacco use: Secondary | ICD-10-CM

## 2014-03-08 DIAGNOSIS — I10 Essential (primary) hypertension: Secondary | ICD-10-CM

## 2014-03-08 DIAGNOSIS — T466X5A Adverse effect of antihyperlipidemic and antiarteriosclerotic drugs, initial encounter: Principal | ICD-10-CM

## 2014-03-08 DIAGNOSIS — G720 Drug-induced myopathy: Secondary | ICD-10-CM | POA: Diagnosis not present

## 2014-03-08 MED ORDER — LISINOPRIL-HYDROCHLOROTHIAZIDE 20-25 MG PO TABS: 1.0000 | ORAL_TABLET | Freq: Every day | ORAL | Status: DC

## 2014-03-08 NOTE — Progress Notes (Signed)
[facility] Subjective:   Patient ID: Antonio SorrowCalvin Libbey male   DOB: 1961-02-16 53 y.o.   MRN: [id number]  HPI: Modena SlaterMr.Jatavian Goostree is a 53 y.o. male with a PMH below who presents for regular follow up.  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  Now feeling much better.  HTN: Reports compliance with medications but occasionally misses doses. Would like to try a combined pill.  Denies any HA, blurry vision.  PUD: No heartburn. Doing well off PPI.    Past Medical History  Diagnosis Date  . Hypertension   . PUD (peptic ulcer disease)     Diagnosed in 1986, with 3 prior bleeds since that time, most recently in 2004  . Hemorrhagic shock 11/23/2012   Current Outpatient Prescriptions  Medication Sig Dispense Refill  . albuterol (PROVENTIL HFA;VENTOLIN HFA) 108 (90 BASE) MCG/ACT inhaler Inhale 1-2 puffs into the lungs every 6 (six) hours as needed for wheezing or shortness of breath. 1 Inhaler 0  . lisinopril-hydrochlorothiazide (PRINZIDE,ZESTORETIC) 20-25 MG per tablet Take 1 tablet by mouth daily. 30 tablet 11   No current facility-administered medications for this visit.   Family History  Problem Relation Age of Onset  . Heart attack Father     at age of 55   History   Social History  . Marital Status: Married    Spouse Name: N/A    Number of Children: N/A  . Years of Education: N/A   Social History Main Topics  . Smoking status: Current Some Day Smoker -- 0.20 packs/day for 24 years    Types: Cigarettes  . Smokeless tobacco: None     Comment: Decreased to 2 cigs/week  . 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 Drug Use: No  . Sexual Activity: None   Other Topics Concern  . None   Social History Narrative   [place] to [place] in 2009.  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  No PCP.   Review of Systems: Review of Systems  Constitutional: Negative for fever and  chills.  HENT: Negative for congestion.   Eyes: Negative for blurred vision.  Respiratory: Negative for cough and shortness of breath.   Cardiovascular: Negative for chest pain.  Gastrointestinal: Negative for heartburn, nausea, vomiting, abdominal pain, diarrhea, constipation and blood in stool.  Genitourinary: Negative for dysuria.  Musculoskeletal: Negative for myalgias.  Neurological: Negative for dizziness and headaches.  Psychiatric/Behavioral: Negative for substance abuse.    Objective:  Physical Exam: Filed Vitals:   03/08/14 1414  BP: 151/98  Pulse: 80  Temp: 98 F (36.7 C)  TempSrc: Oral  Height: 6\' 2"  (1.88 m)  Weight: 205 lb 8 oz (93.214 kg)  SpO2: 100%   Physical Exam  Constitutional: He is well-developed, well-nourished, and in no distress.  HENT:  Head: Normocephalic and atraumatic.  Cardiovascular: Normal rate, regular rhythm and normal heart sounds.   Pulmonary/Chest: Effort normal and breath sounds normal.  Abdominal: Soft. Bowel sounds are normal. He exhibits no distension. There is no tenderness.  Musculoskeletal: He exhibits no edema.  Nursing note and vitals reviewed.   Assessment & Plan:  Case discussed with Dr. Meredith PelJoines  Statin myopathy Patient currently reports his myalgias have resolved off pravastatin.  Will continue off any statin medication for at least a few months.  At next visit will check a CK level to ensure it did fall  back to normal levels and there is other reason for it to be elevated.  Tobacco abuse  Assessment: Progress toward smoking cessation:  smoking less Barriers to progress toward smoking cessation:  stress Comments: down to 2 cig  Plan: Instruction/counseling given:  I counseled patient on the dangers of tobacco use, advised patient to stop smoking, and reviewed strategies to maximize success. Educational resources provided:    Self management tools provided:    Medications to assist with smoking cessation:   None Patient agreed to the following self-care plans for smoking cessation: set a quit date and stop smoking (HAS CUT DOWN TO 2 CIGARETTES WEEK)  Other plans:     Risk for coronary artery disease between 10% and 20% in next 10 years per Framingham score Patient had statin myopathy with pravastatin.  Now off this, he is trying to reduce his risk of CAD by quitting smoking.  Essential hypertension BP Readings from Last 3 Encounters:  03/08/14 151/98  02/06/14 144/96  02/04/14 130/94    Lab Results  Component Value Date   NA 141 02/01/2014   K 4.4 02/01/2014   CREATININE 1.18 02/01/2014    Assessment: Blood pressure control: moderately elevated Progress toward BP goal:  unchanged Comments: still above goal, occasionally missing doses  Plan: Medications:  Continue HCTZ 25mg , increase Lisinopril to 20mg , will have patient start a combined pill to improve adherence. Educational resources provided:   Self management tools provided:   Other plans: Check BMP at next visit.     Medications Ordered Meds ordered this encounter  Medications  . lisinopril-hydrochlorothiazide (PRINZIDE,ZESTORETIC) 20-25 MG per tablet    Sig: Take 1 tablet by mouth daily.    Dispense:  30 tablet    Refill:  11    Please discontinue individual prescriptions for lisinopril and HCTZ.   Other Orders No orders of the defined types were placed in this encounter.

## 2014-03-08 NOTE — Patient Instructions (Signed)
General Instructions: Please finish your current BP medications by taking 1 pill of HCTZ (25mg ) and 2 pills of Lisinopril (10mg  x2) then switch to a combined pill of Lisinopril-HCTZ 20-25mg   Thank you for bringing your medicines today. This helps [place] keep you safe from mistakes.   Progress Toward Treatment Goals:  Treatment Goal 03/08/2014  Blood pressure unchanged  Stop smoking smoking less    Self Care Goals & Plans:  Self Care Goal 03/08/2014  Manage my medications take my medicines as prescribed; bring my medications to every visit; refill my medications on time  Monitor my health -  Eat healthy foods eat more vegetables; eat foods that are low in salt; eat baked foods instead of fried foods  Be physically active take a walk every day  Stop smoking set a quit date and stop smoking    No flowsheet data found.   Care Management & Community Referrals:  Referral 03/08/2014  Referrals made for care management support none needed

## 2014-03-12 NOTE — Assessment & Plan Note (Signed)
BP Readings from Last 3 Encounters:  03/08/14 151/98  02/06/14 144/96  02/04/14 130/94    Lab Results  Component Value Date   NA 141 02/01/2014   K 4.4 02/01/2014   CREATININE 1.18 02/01/2014    Assessment: Blood pressure control: moderately elevated Progress toward BP goal:  unchanged Comments: still above goal, occasionally missing doses  Plan: Medications:  Continue HCTZ 25mg , increase Lisinopril to 20mg , will have patient start a combined pill to improve adherence. Educational resources provided:   Self management tools provided:   Other plans: Check BMP at next visit.

## 2014-03-12 NOTE — Assessment & Plan Note (Signed)
Patient had statin myopathy with pravastatin.  Now off this, he is trying to reduce his risk of CAD by quitting smoking.

## 2014-03-12 NOTE — Assessment & Plan Note (Signed)
Patient currently reports his myalgias have resolved off pravastatin.  Will continue off any statin medication for at least a few months.  At next visit will check a CK level to ensure it did fall back to normal levels and there is other reason for it to be elevated.

## 2014-03-12 NOTE — Assessment & Plan Note (Signed)
  Assessment: Progress toward smoking cessation:  smoking less Barriers to progress toward smoking cessation:  stress Comments: down to 2 cig  Plan: Instruction/counseling given:  I counseled patient on the dangers of tobacco use, advised patient to stop smoking, and reviewed strategies to maximize success. Educational resources provided:    Self management tools provided:    Medications to assist with smoking cessation:  None Patient agreed to the following self-care plans for smoking cessation: set a quit date and stop smoking (HAS CUT DOWN TO 2 CIGARETTES WEEK)  Other plans:

## 2014-03-12 NOTE — Progress Notes (Signed)
Internal Medicine Clinic Attending  Date of Visit: 03/08/2014  Case discussed with Dr. Mikey Bussing at the time of the visit.  We reviewed the resident's history and exam and pertinent patient test results.  I agree with the assessment, diagnosis, and plan of care documented in the resident's note.

## 2014-08-02 ENCOUNTER — Encounter: Payer: Self-pay | Admitting: Internal Medicine

## 2014-08-15 ENCOUNTER — Telehealth: Payer: Self-pay | Admitting: Internal Medicine

## 2014-08-15 NOTE — Telephone Encounter (Signed)
Call to patient to confirm appointment for 08/16/14 at 2:15 lmtcb

## 2014-08-16 ENCOUNTER — Encounter: Payer: Self-pay | Admitting: Internal Medicine

## 2014-08-16 ENCOUNTER — Ambulatory Visit (INDEPENDENT_AMBULATORY_CARE_PROVIDER_SITE_OTHER): Payer: Self-pay | Admitting: Internal Medicine

## 2014-08-16 VITALS — BP 170/105 | HR 80 | Temp 98.3°F | Ht 74.0 in | Wt 207.5 lb

## 2014-08-16 DIAGNOSIS — Z87891 Personal history of nicotine dependence: Secondary | ICD-10-CM

## 2014-08-16 DIAGNOSIS — G720 Drug-induced myopathy: Secondary | ICD-10-CM

## 2014-08-16 DIAGNOSIS — T466X5S Adverse effect of antihyperlipidemic and antiarteriosclerotic drugs, sequela: Secondary | ICD-10-CM

## 2014-08-16 DIAGNOSIS — I10 Essential (primary) hypertension: Secondary | ICD-10-CM

## 2014-08-16 DIAGNOSIS — Z9189 Other specified personal risk factors, not elsewhere classified: Secondary | ICD-10-CM

## 2014-08-16 DIAGNOSIS — Z720 Tobacco use: Secondary | ICD-10-CM

## 2014-08-16 DIAGNOSIS — T466X5A Adverse effect of antihyperlipidemic and antiarteriosclerotic drugs, initial encounter: Secondary | ICD-10-CM

## 2014-08-16 MED ORDER — LISINOPRIL-HYDROCHLOROTHIAZIDE 20-25 MG PO TABS: 1.0000 | ORAL_TABLET | Freq: Every day | ORAL | Status: DC

## 2014-08-16 NOTE — Progress Notes (Signed)
[facility] Subjective:   Patient ID: Antonio Ramos male   DOB: 10-16-1960 54 y.o.   MRN: [id number]  HPI: Antonio Ramos is a 54 y.o. male with a PMH detailed below who presents for routine office visit for 6 month follow up and medication refills.  HTN: He brings an empty bottle of Lisinopril HCTZ 10-12.5, he thinks he may be taking the 20-25 but I cannot get a convincing answer on this from him.  He does report that his BP is elevated today as he has not taken his BP meds in 1 week.  ?Statin intolerance: he was started on pravastatin  last year and developed some generalized soreness, a CK was checked and was 530 which is above our reference range but may be normal for him as he is a muscular african [place].  He has now been off the statin medication for over 6 months.  He still continues to have some soreness in his shoulders especially at night when he goes to bed.  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  Bereavement : reports that he lost his mother 9 months ago due to multiple medical issues and recently lost his Aunt 1 month ago but is holding up well.    Burn: he accidentally touched a carburetor at work and burned his right hand, this is healing well and not causing him trouble.   Past Medical History  Diagnosis Date  . Hypertension   . PUD (peptic ulcer disease)     Diagnosed in 1986, with 3 prior bleeds since that time, most recently in 2004  . Hemorrhagic shock 11/23/2012   Current Outpatient Prescriptions  Medication Sig Dispense Refill  . albuterol (PROVENTIL HFA;VENTOLIN HFA) 108 (90 BASE) MCG/ACT inhaler Inhale 1-2 puffs into the lungs every 6 (six) hours as needed for wheezing or shortness of breath. 1 Inhaler 0  . lisinopril-hydrochlorothiazide (PRINZIDE,ZESTORETIC) 20-25 MG per tablet Take 1 tablet by mouth daily. 30 tablet 11   No current facility-administered medications for this visit.   Family History   Problem Relation Age of Onset  . Heart attack Father     at age of 71   History   Social History  . Marital Status: Married    Spouse Name: N/A  . Number of Children: N/A  . Years of Education: N/A   Social History Main Topics  . Smoking status: Former Smoker -- 0.20 packs/day for 24 years    Types: Cigarettes    Quit date: 03/17/2014  . Smokeless tobacco: Not on file     Comment: Decreased to 2 cigs/week  . 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 Drug Use: No  . Sexual Activity: Not on file   Other Topics Concern  . Not on file   Social History Narrative   Moved from [place] to [place] in 2009.  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   Review of Systems: Review of Systems  Constitutional: Negative for fever, chills, weight loss and malaise/fatigue.  Eyes: Negative for blurred vision.  Respiratory: Negative for cough and shortness of breath.   Cardiovascular: Negative for chest pain and leg swelling.  Gastrointestinal: Negative for heartburn, abdominal pain, diarrhea, constipation, blood in stool and melena.  Genitourinary: Negative for dysuria.  Musculoskeletal: Negative for back pain, joint pain and falls.  Skin: Negative for rash.  Neurological: Negative for dizziness  and headaches.  Psychiatric/Behavioral: Negative for depression.     Objective:  Physical Exam: Filed Vitals:   08/16/14 1410  BP: 170/105  Pulse: 80  Temp: 98.3 F (36.8 C)  TempSrc: Oral  Height: 6\' 2"  (1.88 m)  Weight: 207 lb 8 oz (94.121 kg)  SpO2: 99%   Physical Exam  Constitutional: He is well-developed, well-nourished, and in no distress.  Cardiovascular: Normal rate and regular rhythm.   Pulmonary/Chest: Effort normal and breath sounds normal.  Abdominal: Soft. Bowel sounds are normal. There is no tenderness.  Musculoskeletal: He exhibits no edema.  Skin:  Areas of hypopigmentation on the right hand on the 2nd digit and thumb, no open wounds.   Nursing note and vitals reviewed.    Assessment & Plan:  Case discussed with Dr. Dalphine Handing  Essential hypertension BP Readings from Last 3 Encounters:  08/16/14 170/105  03/08/14 151/98  02/06/14 144/96    Lab Results  Component Value Date   NA 141 02/01/2014   K 4.4 02/01/2014   CREATININE 1.18 02/01/2014    Assessment: Blood pressure control: moderately elevated Progress toward BP goal:  deteriorated Comments: Patient reports 1 week of non compliance but I suspect more, he brings an empty bottle of the Lisinopril 10-12.5 which is suspicious that he has only been taking that instead of the 20-25 he was prescribed at the last visit.  Plan: Medications:  Will continue Lisinopril HCTZ 20-25 daily Educational resources provided: handout Self management tools provided: home blood pressure logbook Other plans: Follow up in about 1 month for BP recheck. Stressed importance of taking pill every day. Will check BMP today    Statin myopathy - He still continues to have some shoulder soreness despite being off pravastatin.  I suspect he may have a component of OA.  I will repeat a CK today to assess where he is off statin therapy.   Tobacco abuse  Assessment: Progress toward smoking cessation:    Barriers to progress toward smoking cessation:    Comments:Quit Dec 2015  Plan: Instruction/counseling given:  I commended patient for quitting and reviewed strategies for preventing relapses. Educational resources provided:    Self management tools provided:    Medications to assist with smoking cessation:  None Patient agreed to the following self-care plans for smoking cessation:    Other plans:      Risk for coronary artery disease between 10% and 20% in next 10 years per Framingham score He remains at elevated risk, but I am hesitant to start a statin back until I repeat his CK off medications.  One option may be generic rosuvastatin which should come available next  year. I did stress BP control and commended for tobacco cessation.     Medications Ordered Meds ordered this encounter  Medications  . lisinopril-hydrochlorothiazide (PRINZIDE,ZESTORETIC) 20-25 MG per tablet    Sig: Take 1 tablet by mouth daily.    Dispense:  30 tablet    Refill:  11   Other Orders Orders Placed This Encounter  Procedures  . BMP with Estimated GFR ([id number])  . CK, total

## 2014-08-16 NOTE — Assessment & Plan Note (Signed)
-   He still continues to have some shoulder soreness despite being off pravastatin.  I suspect he may have a component of OA.  I will repeat a CK today to assess where he is off statin therapy.

## 2014-08-16 NOTE — Patient Instructions (Signed)
General Instructions: I want you to take 1 pill of the Lisinopril HCTZ 20-25mg  a day, please come back in 1-2 months for a BP recheck.  Thank you for bringing your medicines today. This helps [place] keep you safe from mistakes.   Progress Toward Treatment Goals:  Treatment Goal 08/16/2014  Blood pressure deteriorated  Stop smoking -    Self Care Goals & Plans:  Self Care Goal 08/16/2014  Manage my medications take my medicines as prescribed; bring my medications to every visit; refill my medications on time  Monitor my health -  Eat healthy foods drink diet soda or water instead of juice or soda; eat more vegetables; eat foods that are low in salt; eat baked foods instead of fried foods  Be physically active -  Stop smoking -    No flowsheet data found.   Care Management & Community Referrals:  Referral 08/16/2014  Referrals made for care management support none needed

## 2014-08-16 NOTE — Assessment & Plan Note (Signed)
  Assessment: Progress toward smoking cessation:    Barriers to progress toward smoking cessation:    Comments:Quit Dec 2015  Plan: Instruction/counseling given:  I commended patient for quitting and reviewed strategies for preventing relapses. Educational resources provided:    Self management tools provided:    Medications to assist with smoking cessation:  None Patient agreed to the following self-care plans for smoking cessation:    Other plans:

## 2014-08-16 NOTE — Progress Notes (Signed)
Internal Medicine Clinic Attending  Case discussed with Dr. Hoffman at the time of the visit.  We reviewed the resident's history and exam and pertinent patient test results.  I agree with the assessment, diagnosis, and plan of care documented in the resident's note.  

## 2014-08-16 NOTE — Assessment & Plan Note (Signed)
He remains at elevated risk, but I am hesitant to start a statin back until I repeat his CK off medications.  One option may be generic rosuvastatin which should come available next year. I did stress BP control and commended for tobacco cessation.

## 2014-08-16 NOTE — Assessment & Plan Note (Signed)
BP Readings from Last 3 Encounters:  08/16/14 170/105  03/08/14 151/98  02/06/14 144/96    Lab Results  Component Value Date   NA 141 02/01/2014   K 4.4 02/01/2014   CREATININE 1.18 02/01/2014    Assessment: Blood pressure control: moderately elevated Progress toward BP goal:  deteriorated Comments: Patient reports 1 week of non compliance but I suspect more, he brings an empty bottle of the Lisinopril 10-12.5 which is suspicious that he has only been taking that instead of the 20-25 he was prescribed at the last visit.  Plan: Medications:  Will continue Lisinopril HCTZ 20-25 daily Educational resources provided: handout Self management tools provided: home blood pressure logbook Other plans: Follow up in about 1 month for BP recheck. Stressed importance of taking pill every day. Will check BMP today

## 2014-08-17 ENCOUNTER — Encounter: Payer: Self-pay | Admitting: Internal Medicine

## 2014-08-17 LAB — CK: Total CK: 525 U/L — ABNORMAL HIGH (ref 7–232)

## 2014-08-17 LAB — BASIC METABOLIC PANEL WITH GFR
BUN: 14 mg/dL (ref 6–23)
CALCIUM: 9.2 mg/dL (ref 8.4–10.5)
CHLORIDE: 105 meq/L (ref 96–112)
CO2: 25 meq/L (ref 19–32)
CREATININE: 1.09 mg/dL (ref 0.50–1.35)
GFR, EST AFRICAN AMERICAN: 87 mL/min
GFR, EST NON AFRICAN AMERICAN: 75 mL/min
Glucose, Bld: 86 mg/dL (ref 70–99)
POTASSIUM: 4.1 meq/L (ref 3.5–5.3)
Sodium: 138 mEq/L (ref 135–145)

## 2014-10-22 ENCOUNTER — Emergency Department (HOSPITAL_COMMUNITY): Payer: Self-pay

## 2014-10-22 ENCOUNTER — Emergency Department (HOSPITAL_COMMUNITY)
Admission: EM | Admit: 2014-10-22 | Discharge: 2014-10-22 | Disposition: A | Discharge status: Home / Self Care | Payer: Self-pay | Attending: Emergency Medicine | Admitting: Emergency Medicine

## 2014-10-22 ENCOUNTER — Encounter (HOSPITAL_COMMUNITY): Payer: Self-pay

## 2014-10-22 DIAGNOSIS — X58XXXA Exposure to other specified factors, initial encounter: Secondary | ICD-10-CM | POA: Insufficient documentation

## 2014-10-22 DIAGNOSIS — I10 Essential (primary) hypertension: Secondary | ICD-10-CM | POA: Insufficient documentation

## 2014-10-22 DIAGNOSIS — Y929 Unspecified place or not applicable: Secondary | ICD-10-CM | POA: Insufficient documentation

## 2014-10-22 DIAGNOSIS — Z8719 Personal history of other diseases of the digestive system: Secondary | ICD-10-CM | POA: Insufficient documentation

## 2014-10-22 DIAGNOSIS — S46911A Strain of unspecified muscle, fascia and tendon at shoulder and upper arm level, right arm, initial encounter: Secondary | ICD-10-CM | POA: Insufficient documentation

## 2014-10-22 DIAGNOSIS — Z79899 Other long term (current) drug therapy: Secondary | ICD-10-CM | POA: Insufficient documentation

## 2014-10-22 DIAGNOSIS — Y9389 Activity, other specified: Secondary | ICD-10-CM | POA: Insufficient documentation

## 2014-10-22 DIAGNOSIS — Z87891 Personal history of nicotine dependence: Secondary | ICD-10-CM | POA: Insufficient documentation

## 2014-10-22 DIAGNOSIS — Y999 Unspecified external cause status: Secondary | ICD-10-CM | POA: Insufficient documentation

## 2014-10-22 MED ORDER — OXYCODONE-ACETAMINOPHEN 5-325 MG PO TABS
1.0000 | ORAL_TABLET | Freq: Once | ORAL | Status: AC
  Administered 2014-10-22: 1 via ORAL
  Filled 2014-10-22: qty 1

## 2014-10-22 MED ORDER — OXYCODONE-ACETAMINOPHEN 5-325 MG PO TABS: 1.0000 | ORAL_TABLET | Freq: Three times a day (TID) | ORAL | Status: DC | PRN

## 2014-10-22 NOTE — ED Notes (Signed)
Declined W/C at D/C and was escorted to lobby by RN. 

## 2014-10-22 NOTE — Discharge Instructions (Signed)

## 2014-10-22 NOTE — ED Notes (Signed)
Patient was doing mechanical work on his own car yesterday. When he was pulling on something, he felt shooting pain in the right arm and was unable to lift it above his head. Patient report swelling in the right hand this morning. Denies Chest pain.

## 2014-10-22 NOTE — ED Provider Notes (Signed)
CSN: [id number]     Arrival date & time 10/22/14  1343 History  This chart was scribed for non-physician practitioner, Teressa Lower, NP, working with Nelva Nay, MD, by Budd Palmer ED Scribe. This patient was seen in room TR05C/TR05C and the patient's care was started at 1:56 PM  Chief Complaint  Patient presents with  . Shoulder Pain   The history is provided by the patient. No language interpreter was used.   HPI Comments: Antonio Ramos is a 54 y.o. male with a PMHx of HTN, who presents to the Emergency Department complaining of constant, aching right shoulder pain onset 1 day ago at 7AM. He states he was doing mechanical work on his own car. Pt was taking off a bolt with his wrench, and pulled his shoulder. He has never had a similar injury before. He reports associated right arm swelling last night, as well as pain in his right forearm. Pt also notes he is unable to lift the arm and experiences pain if he uses his other hand to lift it.  Past Medical History  Diagnosis Date  . Hypertension   . PUD (peptic ulcer disease)     Diagnosed in 1986, with 3 prior bleeds since that time, most recently in 2004  . Hemorrhagic shock 11/23/2012   Past Surgical History  Procedure Laterality Date  . Upper gi endoscopy    . Esophagogastroduodenoscopy N/A 11/23/2012    Procedure: ESOPHAGOGASTRODUODENOSCOPY (EGD);  Surgeon: Petra Kuba, MD;  Location: [facility] END[facility]COPY;  Service: Endoscopy;  Laterality: N/A;   Family History  Problem Relation Age of Onset  . Heart attack Father     at age of 43   History  Substance Use Topics  . Smoking status: Former Smoker -- 0.20 packs/day for 24 years    Types: Cigarettes    Quit date: 03/17/2014  . Smokeless tobacco: Not on file     Comment: Decreased to 2 cigs/week  . Alcohol Use: No     Comment: states used to drink heavily in 1983    Review of Systems  Musculoskeletal: Positive for myalgias and arthralgias.  All other systems reviewed and are  negative.   Allergies  Review of patient's allergies indicates no known allergies.  Home Medications   Prior to Admission medications   Medication Sig Start Date End Date Taking? Authorizing Provider  albuterol (PROVENTIL HFA;VENTOLIN HFA) 108 (90 BASE) MCG/ACT inhaler Inhale 1-2 puffs into the lungs every 6 (six) hours as needed for wheezing or shortness of breath. 02/04/14   Blane Ohara, MD  lisinopril-hydrochlorothiazide (PRINZIDE,ZESTORETIC) 20-25 MG per tablet Take 1 tablet by mouth daily. 08/16/14   Gust Rung, DO   BP 156/86 mmHg  Pulse 91  Temp(Src) 98.6 F (37 C) (Oral)  Resp 18  SpO2 96% Physical Exam  Constitutional: He is oriented to person, place, and time. He appears well-developed and well-nourished. No distress.  HENT:  Head: Normocephalic and atraumatic.  Mouth/Throat: Oropharynx is clear and moist.  Eyes: Conjunctivae and EOM are normal. Pupils are equal, round, and reactive to light.  Neck: Normal range of motion. Neck supple. No tracheal deviation present.  Cardiovascular: Normal rate.   Pulmonary/Chest: Breath sounds normal. No respiratory distress.  Abdominal: Soft.  Musculoskeletal: Normal range of motion.  No gross deformity or swelling noted to the right arm. Pt tender on the lateral right shoulder. Unable to lift without using other arm  Neurological: He is alert and oriented to person, place, and time.  Skin:  Skin is warm and dry.  Psychiatric: He has a normal mood and affect. His behavior is normal.  Nursing note and vitals reviewed.   ED Course  Procedures  DIAGNOSTIC STUDIES: Oxygen Saturation is 96% on RA, adequate by my interpretation.    COORDINATION OF CARE: 1:58 PM - Discussed plans to order an XR of the right shoulder as well as pain medication.. Pt advised of plan for treatment and pt agrees.  Labs Review Labs Reviewed - No data to display  Imaging Review Dg Shoulder Right  10/22/2014   CLINICAL DATA:  Right shoulder injury  while working on car. Unable to abduct. Initial encounter.  EXAM: RIGHT SHOULDER - 2+ VIEW  COMPARISON:  None.  FINDINGS: No acute fracture or dislocation. No soft tissue mineralization. Acromioclavicular joint alignment is stable from chest x-ray 11/23/2012.  Inferior acromial spur causing mild subacromial space narrowing. No notable glenohumeral or acromioclavicular narrowing.  IMPRESSION: 1. No acute finding. 2. Subacromial spurring and narrowing.   Electronically Signed   By: Marnee Spring M.D.   On: 10/22/2014 14:24     EKG Interpretation None      MDM   Final diagnoses:  Shoulder strain, right, initial encounter    No acute bony injury noted. Pt neurovascularly intact. Will send home on percocet and give ortho referral  I personally performed the services described in this documentation, which was scribed in my presence. The recorded information has been reviewed and is accurate.   Teressa Lower, NP 10/22/14 1438  Nelva Nay, MD 10/27/14 2225

## 2014-10-22 NOTE — ED Notes (Signed)
NP at the bedside

## 2014-10-22 NOTE — ED Notes (Signed)
Patient returned from X-ray 

## 2014-12-17 ENCOUNTER — Emergency Department (HOSPITAL_COMMUNITY)
Admission: EM | Admit: 2014-12-17 | Discharge: 2014-12-17 | Disposition: A | Discharge status: Home / Self Care | Payer: BLUE CROSS/BLUE SHIELD | Attending: Physician Assistant | Admitting: Physician Assistant

## 2014-12-17 ENCOUNTER — Ambulatory Visit (INDEPENDENT_AMBULATORY_CARE_PROVIDER_SITE_OTHER): Payer: BLUE CROSS/BLUE SHIELD | Admitting: Internal Medicine

## 2014-12-17 ENCOUNTER — Encounter (HOSPITAL_COMMUNITY): Payer: Self-pay | Admitting: Emergency Medicine

## 2014-12-17 ENCOUNTER — Encounter: Payer: Self-pay | Admitting: Internal Medicine

## 2014-12-17 VITALS — BP 152/101 | HR 64 | Temp 97.8°F | Ht 74.0 in | Wt 202.6 lb

## 2014-12-17 DIAGNOSIS — I10 Essential (primary) hypertension: Secondary | ICD-10-CM

## 2014-12-17 DIAGNOSIS — Z87891 Personal history of nicotine dependence: Secondary | ICD-10-CM | POA: Diagnosis not present

## 2014-12-17 DIAGNOSIS — Z79899 Other long term (current) drug therapy: Secondary | ICD-10-CM | POA: Diagnosis not present

## 2014-12-17 DIAGNOSIS — Z8711 Personal history of peptic ulcer disease: Secondary | ICD-10-CM | POA: Insufficient documentation

## 2014-12-17 DIAGNOSIS — M7541 Impingement syndrome of right shoulder: Secondary | ICD-10-CM | POA: Insufficient documentation

## 2014-12-17 DIAGNOSIS — M25511 Pain in right shoulder: Secondary | ICD-10-CM | POA: Insufficient documentation

## 2014-12-17 MED ORDER — KETOROLAC TROMETHAMINE 30 MG/ML IM SOLN: 60.0000 mg | Freq: Once | INTRAMUSCULAR | Status: DC

## 2014-12-17 MED ORDER — NAPROXEN 500 MG PO TABS: 500.0000 mg | ORAL_TABLET | Freq: Two times a day (BID) | ORAL | Status: DC

## 2014-12-17 MED ORDER — OXYCODONE-ACETAMINOPHEN 5-325 MG PO TABS: 1.0000 | ORAL_TABLET | Freq: Four times a day (QID) | ORAL | Status: DC | PRN

## 2014-12-17 MED ORDER — KETOROLAC TROMETHAMINE 60 MG/2ML IM SOLN: 60.0000 mg | Freq: Once | INTRAMUSCULAR | Status: DC

## 2014-12-17 MED ORDER — KETOROLAC TROMETHAMINE 30 MG/ML IJ SOLN
60.0000 mg | Freq: Once | INTRAMUSCULAR | Status: AC
  Administered 2014-12-17: 60 mg via INTRAMUSCULAR

## 2014-12-17 NOTE — Assessment & Plan Note (Signed)
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 He was seen in ED at that time and given Percocet which has not helped. Xray of right shoulder showed subacromial spurring with narrowing. He returned to ED this AM and given refill of Percocet to hold him over until next [facility] appointment on 12/24/14. He also reports some swelling of his hand this morning when he woke up as his hand was hanging off the edge of the bed. The pain is on and off, worse at night and when bearing weight, and described as muscle tightness. Moving his arm during the day is helpful.  Due to pain he is unable to lift his arm above about 60 degrees when abducting or raising arm in front of himself. He is unable to raise his arm to touch the back of his head with his hand, but is able to reach behind to touch his back. He can grab his left shoulder without pain and keep arm raised at 60 degrees without dropping. Exam shows no obvious swelling, effusion, fluctuance, deformity, or warmth to touch. Pain is elicited when palpating biceps tendon at elbow and shoulder. Patient likely has biceps tendinitis or osteoarthritic cause of his shoulder pain. Need to consider rotator cuff pathology as well.  -Given Ketorolac 60 mg IM -Started Naproxen 500 mg BID -Patient advised to come see [place] if symptoms worsen or do not improve. May need further imaging with CT/MRI and PT or sports medicine referral.

## 2014-12-17 NOTE — Patient Instructions (Signed)
It was a pleasure to meet you Mr. Foland.  We have given you a shot today for your pain called Toradol (Ketorolac).  I have sent a prescription for Naproxen 500 mg to your pharmacy. You can take this twice a day.  Please follow up with [place] in 1 week. If your pain should not get better, we may need to do further imaging with CT or MRI and refer you to a Sports Medicine doctor.  Please stop taking the Percocet.

## 2014-12-17 NOTE — ED Provider Notes (Addendum)
CSN: [id number]     Arrival date & time 12/17/14  4098 History   First MD Initiated Contact with Patient 12/17/14 0720     Chief Complaint  Patient presents with  . Shoulder Pain     (Consider location/radiation/quality/duration/timing/severity/associated sxs/prior Treatment) HPI   Patient is a 54 year old male here with right shoulder pain. Patient was seen recently for shoulder pain and says that that has not been made any better by the pain pills that we gave him. He had normal imaging at that time. Patient states that he has pain with complete range of motion of his right shoulder. He works as a Pensions consultant at [employer]. He states that during the day it feels fine once he gets up and starts moving however at night he is unable to sleep because of the pain. Patient belongs to group of physicians in the basement and was planning to see them on the 16th.  Past Medical History  Diagnosis Date  . Hypertension   . PUD (peptic ulcer disease)     Diagnosed in 1986, with 3 prior bleeds since that time, most recently in 2004  . Hemorrhagic shock 11/23/2012   Past Surgical History  Procedure Laterality Date  . Upper gi endoscopy    . Esophagogastroduodenoscopy N/A 11/23/2012    Procedure: ESOPHAGOGASTRODUODENOSCOPY (EGD);  Surgeon: Petra Kuba, MD;  Location: [facility] ENDOSCOPY;  Service: Endoscopy;  Laterality: N/A;   Family History  Problem Relation Age of Onset  . Heart attack Father     at age of 64   Social History  Substance Use Topics  . Smoking status: Former Smoker -- 0.20 packs/day for 24 years    Types: Cigarettes    Quit date: 03/17/2014  . Smokeless tobacco: None     Comment: Decreased to 2 cigs/week  . Alcohol Use: No     Comment: states used to drink heavily in 1983    Review of Systems  Constitutional: Negative for activity change.  Respiratory: Negative for shortness of breath.   Cardiovascular: Negative for chest pain.  Gastrointestinal: Negative for abdominal pain.       Allergies  Review of patient's allergies indicates no known allergies.  Home Medications   Prior to Admission medications   Medication Sig Start Date End Date Taking? Authorizing Provider  albuterol (PROVENTIL HFA;VENTOLIN HFA) 108 (90 BASE) MCG/ACT inhaler Inhale 1-2 puffs into the lungs every 6 (six) hours as needed for wheezing or shortness of breath. 02/04/14   Blane Ohara, MD  lisinopril-hydrochlorothiazide (PRINZIDE,ZESTORETIC) 20-25 MG per tablet Take 1 tablet by mouth daily. 08/16/14   Gust Rung, DO  oxyCODONE-acetaminophen (PERCOCET/ROXICET) 5-325 MG per tablet Take 1-2 tablets by mouth every 8 (eight) hours as needed for severe pain. 10/22/14   Teressa Lower, NP   BP 152/106 mmHg  Pulse 78  Temp(Src) 97.7 F (36.5 C) (Oral)  Resp 16  Ht  (1.88 m)  Wt 205 lb (92.987 kg)  BMI 26.31 kg/m2  SpO2 98% Physical Exam  Constitutional: He is oriented to person, place, and time. He appears well-nourished.  HENT:  Head: Normocephalic.  Eyes: Conjunctivae are normal.  Cardiovascular: Normal rate.   Pulmonary/Chest: Effort normal.  Musculoskeletal:  Patient has pain with range of motion last 15. Normal sensation and strength.  Neurological: He is oriented to person, place, and time.  Skin: Skin is warm and dry. He is not diaphoretic.  Psychiatric: He has a normal mood and affect. His behavior is normal.  ED Course  Procedures (including critical care time) Labs Review Labs Reviewed - No data to display  Imaging Review No results found. I have personally reviewed and evaluated these images and lab results as part of my medical decision-making.   EKG Interpretation None      MDM   Final diagnoses:  None   Patient is a 54 year old male presenting with right shoulder pain. This has been going on for the last couple weeks. He says that he has no pain during the day., Only at night. He was already seen in the emergency department had normal imaging.  Patient feels that it is muscle strain. However he is unable to sleep at night due to the pain. We will give him short course Percocet in order to make it to his next appointment which is on September 16 with his normal primary care office. He may require consultation with orthopedics additional imaging and physical therapy however it is not emergent at this time and it would be better coordinated by his primary care office.  8:10 AM Patient refusing EKG, would like to go to his primary phsyician's walk in clinic downstairs.  We will lethim go, low suspicion for any cardiac disease.   Lenzie Sandler Randall An, MD 12/17/14 3202  Mendel Binsfeld Randall An, MD 12/17/14 3343

## 2014-12-17 NOTE — ED Notes (Signed)
Pt c/o right shoulder pain x couple weeks. Pt seen here for same and was told he pulled a muscle and was given medication. Pt reports pain only hurts at night then he is fine during the day. Pt able to move shoulder without difficulty.

## 2014-12-17 NOTE — Progress Notes (Signed)
Patient ID: Antonio Ramos, male   DOB: [birth year], 54 y.o.   MRN: [id number]   Subjective:   Patient ID: Antonio Ramos male   DOB: 13-Aug-1960 54 y.o.   MRN: [id number]  HPI: Antonio Ramos is a 54 y.o. male with PMH as listed below who presents with complaints of right shoulder pain.  Please see problem list for details.   Past Medical History  Diagnosis Date  . Hypertension   . PUD (peptic ulcer disease)     Diagnosed in 1986, with 3 prior bleeds since that time, most recently in 2004  . Hemorrhagic shock 11/23/2012   Current Outpatient Prescriptions  Medication Sig Dispense Refill  . lisinopril-hydrochlorothiazide (PRINZIDE,ZESTORETIC) 20-25 MG per tablet Take 1 tablet by mouth daily. 30 tablet 11  . albuterol (PROVENTIL HFA;VENTOLIN HFA) 108 (90 BASE) MCG/ACT inhaler Inhale 1-2 puffs into the lungs every 6 (six) hours as needed for wheezing or shortness of breath. (Patient not taking: Reported on 12/17/2014) 1 Inhaler 0  . naproxen (NAPROSYN) 500 MG tablet Take 1 tablet (500 mg total) by mouth 2 (two) times daily with a meal. 14 tablet 0   No current facility-administered medications for this visit.   Family History  Problem Relation Age of Onset  . Heart attack Father     at age of 58   Social History   Social History  . Marital Status: Married    Spouse Name: N/A  . Number of Children: N/A  . Years of Education: N/A   Social History Main Topics  . Smoking status: Former Smoker -- 0.20 packs/day for 24 years    Types: Cigarettes    Quit date: 03/17/2014  . Smokeless tobacco: None  . 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 Drug Use: No  . Sexual Activity: Not Asked   Other Topics Concern  . None   Social History Narrative   Moved from [place] to [place] in 2009.  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   Review of Systems: Review of Systems  Constitutional: Negative for fever and chills.  Respiratory: Negative for shortness of  breath.   Cardiovascular: Negative for chest pain and palpitations.  Musculoskeletal: Positive for joint pain. Negative for myalgias, back pain, falls and neck pain.  Neurological: Negative for tingling, sensory change and headaches.    Objective:  Physical Exam: Filed Vitals:   12/17/14 0845  BP: 152/101  Pulse: 64  Temp: 97.8 F (36.6 C)  TempSrc: Oral  Height: 6\' 2"  (1.88 m)  Weight: 202 lb 9.6 oz (91.899 kg)  SpO2: 99%   Physical Exam  Constitutional: He is oriented to person, place, and time. He appears well-developed and well-nourished.  HENT:  Head: Normocephalic and atraumatic.  Cardiovascular: Normal rate and regular rhythm.   Pulmonary/Chest: Effort normal and breath sounds normal.  Musculoskeletal: He exhibits no edema.       Right shoulder: He exhibits decreased range of motion, tenderness and pain. He exhibits no swelling, no effusion, no spasm and normal strength.  Neurological: He is alert and oriented to person, place, and time.    Assessment & Plan:  Please see problem based charting.

## 2014-12-17 NOTE — Discharge Instructions (Signed)
You need to follow up with your PCP about your shoulder pain. We do not think there are any broken bones, you may require outpatient imaging or physical therapy to help with the discomfort.  Adhesive Capsulitis Sometimes the shoulder becomes stiff and is painful to move. Some people say it feels as if the shoulder is frozen in place. Because of this, the condition is called "frozen shoulder." Its medical name is adhesive capsulitis.  The shoulder joint is made up of strong connective tissue that attaches the ball of the humerus to the shallow shoulder socket. This strong connective tissue is called the joint capsule. This tissue can become stiff and swollen. That is when adhesive capsulitis sets in. CAUSES  It is not always clear just what the cause adhesive capsulitis. Possibilities include:  Injury to the shoulder joint.  Strain. This is a repetitive injury brought about by overuse.  Lack of use. Perhaps your arm or hand was otherwise injured. It might have been in a sling for awhile. Or perhaps you were not using it to avoid pain.  Referred pain. This is a sort of trick the body plays. You feel pain in the shoulder. But, the pain actually comes from an injury somewhere else in the body.  Long-standing health problems. Several diseases can cause adhesive capsulitis. They include diabetes, heart disease, stroke, thyroid problems, rheumatoid arthritis and lung disease.  Being a women older than 40. Anyone can develop adhesive capsulitis but it is most common in women in this age group. SYMPTOMS   Pain.  It occurs when the arm is moved.  Parts of the shoulder might hurt if they are touched.  Pain is worse at night or when resting.  Soreness. It might not be strong enough to be called pain. But, the shoulder aches.  The shoulder does not move freely.  Muscle spasms.  Trouble sleeping because of shoulder ache or pain. DIAGNOSIS  To decide if you have adhesive capsulitis, your  healthcare provider will probably:  Ask about symptoms you have noticed.  Ask about your history of joint pain and anything that might have caused the pain.  Ask about your overall health.  Use hands to feel your shoulder and neck.  Ask you to move your shoulder in specific directions. This may indicate the origin of the pain.  Order imaging tests; pictures of the shoulder. They help pinpoint the source of the problem. An X-ray might be used. For more detail, an MRI is often used. An MRI details the tendons, muscles and ligaments as well as the joint. TREATMENT  Adhesive capsulitis can be treated several ways. Most treatments can be done in a clinic or in your healthcare provider's office. Be sure to discuss the different options with your caregiver. They include:  Physical therapy. You will work on specific exercises to get your shoulder moving again. The exercises usually involve stretching. A physical therapist (a caregiver with special training) can show you what to do and what not to do. The exercises will need to be done daily.  Medication.  Over-the-counter medicines may relieve pain and inflammation (the body's way of reacting to injury or infection).  Corticosteroids. These are stronger drugs to reduce pain and inflammation. They are given by injection (shots) into the shoulder joint. Frequent treatment is not recommended.  Muscle relaxants. Medication may be prescribed to ease muscle spasms.  Treatment of underlying conditions. This means treating another condition that is causing your shoulder problem. This might be a rotator  cuff (tendon) problem  Shoulder manipulation. The shoulder will be moved by your healthcare provider. You would be under general anesthesia (given a drug that puts you to sleep). You would not feel anything. Sometimes the joint will be injected with salt water (saline) at high pressure to break down internal scarring in the joint capsule.  Surgery. This  is rarely needed. It may be suggested in advanced cases after all other treatment has failed. PROGNOSIS  In time, most people recover from adhesive capsulitis. Sometimes, however, the pain goes away but full movement of the shoulder does not return.  HOME CARE INSTRUCTIONS   Take any pain medications recommended by your healthcare provider. Follow the directions carefully.  If you have physical therapy, follow through with the therapist's suggestions. Be sure you understand the exercises you will be doing. You should understand:  How often the exercises should be done.  How many times each exercise should be repeated.  How long they should be done.  What other activities you should do, or not do.  That you should warm up before doing any exercise. Just 5 to 10 minutes will help. Small, gentle movements should get your shoulder ready for more.  Avoid high-demand exercise that involves your shoulder such as throwing. This type of exercise can make pain worse.  Consider using cold packs. Cold may ease swelling and pain. Ask your healthcare provider if a cold pack might help you. If so, get directions on how and when to use them. SEEK MEDICAL CARE IF:   You have any questions about your medications.  Your pain continues to increase. Document Released: 01/18/2009 Document Revised: 06/15/2011 Document Reviewed: 01/18/2009 [facility] Patient Information 2015 [place], [place]. This information is not intended to replace advice given to you by your health care provider. Make sure you discuss any questions you have with your health care provider.

## 2014-12-17 NOTE — Assessment & Plan Note (Signed)
BP Readings from Last 3 Encounters:  12/17/14 152/101  12/17/14 144/96  10/22/14 156/86   Patient with elevated blood pressure today. He admits that he has not taken his medication this morning as he usually takes with his meal, but was in ED today. -Continue Lisinopril-HCTZ 20-12.5 mg daily -Patient advised to take medication everyday

## 2014-12-21 NOTE — Progress Notes (Signed)
Internal Medicine Clinic Attending  I saw and evaluated the patient.  I personally confirmed the key portions of the history and exam documented by Dr. Patel,Vishal and I reviewed pertinent patient test results.  The assessment, diagnosis, and plan were formulated together and I agree with the documentation in the resident's note.  

## 2014-12-24 ENCOUNTER — Ambulatory Visit (INDEPENDENT_AMBULATORY_CARE_PROVIDER_SITE_OTHER): Payer: BLUE CROSS/BLUE SHIELD | Admitting: Internal Medicine

## 2014-12-24 ENCOUNTER — Encounter: Payer: Self-pay | Admitting: Internal Medicine

## 2014-12-24 VITALS — BP 146/96 | HR 73 | Temp 98.2°F | Ht 74.0 in | Wt 203.0 lb

## 2014-12-24 DIAGNOSIS — M25511 Pain in right shoulder: Secondary | ICD-10-CM

## 2014-12-24 DIAGNOSIS — Z23 Encounter for immunization: Secondary | ICD-10-CM | POA: Diagnosis not present

## 2014-12-24 DIAGNOSIS — I10 Essential (primary) hypertension: Secondary | ICD-10-CM | POA: Diagnosis not present

## 2014-12-24 DIAGNOSIS — Z0000 Encounter for general adult medical examination without abnormal findings: Secondary | ICD-10-CM

## 2014-12-24 MED ORDER — HYDROCODONE-ACETAMINOPHEN 5-325 MG PO TABS: 1.0000 | ORAL_TABLET | Freq: Four times a day (QID) | ORAL | Status: DC | PRN

## 2014-12-24 NOTE — Assessment & Plan Note (Signed)
BP Readings from Last 3 Encounters:  12/24/14 146/96  12/17/14 152/101  12/17/14 144/96    Lab Results  Component Value Date   NA 138 08/16/2014   K 4.1 08/16/2014   CREATININE 1.09 08/16/2014    Assessment: Blood pressure control:  Acceptable Comments: Complaint with meds- Lisinopril-HCTZ- 20-25mg , compliant.  Plan: Medications:  continue current medications Other plans:  - Bmet next visit - Quit smoking ~1 yr ago, cold [place], congratulated pt

## 2014-12-24 NOTE — Patient Instructions (Signed)
We will be prescribing a pain medication for you, take 1 tablet as needed for pain, at night, if this is not effective, then take 2 tablets. Give your shoulder about 1 more month, and if the pain is still severe. We will send you to a specialist. It is possible you tore some muscles in your shoulder or injured some ligaments.    Rotator Cuff Injury Rotator cuff injury is any type of injury to the set of muscles and tendons that make up the stabilizing unit of your shoulder. This unit holds the ball of your upper arm bone (humerus) in the socket of your shoulder blade (scapula).  CAUSES Injuries to your rotator cuff most commonly come from sports or activities that cause your arm to be moved repeatedly over your head. Examples of this include throwing, weight lifting, swimming, or racquet sports. Long lasting (chronic) irritation of your rotator cuff can cause soreness and swelling (inflammation), bursitis, and eventual damage to your tendons, such as a tear (rupture). SIGNS AND SYMPTOMS Acute rotator cuff tear:  Sudden tearing sensation followed by severe pain shooting from your upper shoulder down your arm toward your elbow.  Decreased range of motion of your shoulder because of pain and muscle spasm.  Severe pain.  Inability to raise your arm out to the side because of pain and loss of muscle power (large tears). Chronic rotator cuff tear:  Pain that usually is worse at night and may interfere with sleep.  Gradual weakness and decreased shoulder motion as the pain worsens.  Decreased range of motion. Rotator cuff tendinitis:  Deep ache in your shoulder and the outside upper arm over your shoulder.  Pain that comes on gradually and becomes worse when lifting your arm to the side or turning it inward. DIAGNOSIS Rotator cuff injury is diagnosed through a medical history, physical exam, and imaging exam. The medical history helps determine the type of rotator cuff injury. Your health  care provider will look at your injured shoulder, feel the injured area, and ask you to move your shoulder in different positions. X-ray exams typically are done to rule out other causes of shoulder pain, such as fractures. MRI is the exam of choice for the most severe shoulder injuries because the images show muscles and tendons.  TREATMENT  Chronic tear:  Medicine for pain, such as acetaminophen or ibuprofen.  Physical therapy and range-of-motion exercises may be helpful in maintaining shoulder function and strength.  Steroid injections into your shoulder joint.  Surgical repair of the rotator cuff if the injury does not heal with noninvasive treatment. Acute tear:  Anti-inflammatory medicines such as ibuprofen and naproxen to help reduce pain and swelling.  A sling to help support your arm and rest your rotator cuff muscles. Long-term use of a sling is not advised. It may cause significant stiffening of the shoulder joint.  Surgery may be considered within a few weeks, especially in younger, active people, to return the shoulder to full function.  Indications for surgical treatment include the following:  Age younger than 60 years.  Rotator cuff tears that are complete.  Physical therapy, rest, and anti-inflammatory medicines have been used for 6-8 weeks, with no improvement.  Employment or sporting activity that requires constant shoulder use. Tendinitis:  Anti-inflammatory medicines such as ibuprofen and naproxen to help reduce pain and swelling.  A sling to help support your arm and rest your rotator cuff muscles. Long-term use of a sling is not advised. It may cause significant  stiffening of the shoulder joint.  Severe tendinitis may require:  Steroid injections into your shoulder joint.  Physical therapy.  Surgery. HOME CARE INSTRUCTIONS   Apply ice to your injury:  Put ice in a plastic bag.  Place a towel between your skin and the bag.  Leave the ice on for  20 minutes, 2-3 times a day.  If you have a shoulder immobilizer (sling and straps), wear it until told otherwise by your health care provider.  You may want to sleep on several pillows or in a recliner at night to lessen swelling and pain.  Only take over-the-counter or prescription medicines for pain, discomfort, or fever as directed by your health care provider.  Do simple hand squeezing exercises with a soft rubber ball to decrease hand swelling. SEEK MEDICAL CARE IF:   Your shoulder pain increases, or new pain or numbness develops in your arm, hand, or fingers.  Your hand or fingers are colder than your other hand. SEEK IMMEDIATE MEDICAL CARE IF:   Your arm, hand, or fingers are numb or tingling.  Your arm, hand, or fingers are increasingly swollen and painful, or they turn white or blue. MAKE SURE YOU:  Understand these instructions.  Will watch your condition.  Will get help right away if you are not doing well or get worse.

## 2014-12-24 NOTE — Assessment & Plan Note (Signed)
Flu shot today. Pt reports his last colonoscopy was 2008, and he was told to get a repeat in 2yrs time. He also does not want to do this test.

## 2014-12-24 NOTE — Progress Notes (Signed)
Patient ID: Antonio Ramos, male   DOB: 1960-10-21, 54 y.o.   MRN: [id number]   Subjective:   Patient ID: Antonio Ramos male   DOB: 06/11/1960 54 y.o.   MRN: [id number]  HPI: Antonio Ramos is a 54 y.o. with PMH listed below, presented with complainst of persistent shoulder pain, he is here today for follow up of his chronic medical condition- HTN. Please see assessment and plan for details and follow up of his shoulder pain and HTN.  Past Medical History  Diagnosis Date  . Hypertension   . PUD (peptic ulcer disease)     Diagnosed in 1986, with 3 prior bleeds since that time, most recently in 2004  . Hemorrhagic shock 11/23/2012   Current Outpatient Prescriptions  Medication Sig Dispense Refill  . albuterol (PROVENTIL HFA;VENTOLIN HFA) 108 (90 BASE) MCG/ACT inhaler Inhale 1-2 puffs into the lungs every 6 (six) hours as needed for wheezing or shortness of breath. (Patient not taking: Reported on 12/17/2014) 1 Inhaler 0  . HYDROcodone-acetaminophen (NORCO/VICODIN) 5-325 MG per tablet Take 1-2 tablets by mouth every 6 (six) hours as needed for moderate pain. 30 tablet 0  . lisinopril-hydrochlorothiazide (PRINZIDE,ZESTORETIC) 20-25 MG per tablet Take 1 tablet by mouth daily. 30 tablet 11  . naproxen (NAPROSYN) 500 MG tablet Take 1 tablet (500 mg total) by mouth 2 (two) times daily with a meal. 14 tablet 0   No current facility-administered medications for this visit.   Family History  Problem Relation Age of Onset  . Heart attack Father     at age of 61   Social History   Social History  . Marital Status: Married    Spouse Name: N/A  . Number of Children: N/A  . Years of Education: N/A   Social History Main Topics  . Smoking status: Former Smoker -- 0.20 packs/day for 24 years    Types: Cigarettes    Quit date: 03/17/2014  . Smokeless tobacco: None  . 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 Drug Use: No  . Sexual Activity: Not Asked   Other Topics  Concern  . None   Social History Narrative   Moved from [place] to [place] in 2009.  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   Review of Systems: CONSTITUTIONAL- No Fever, weightloss, night sweat or change in appetite. SKIN- No Rash, colour changes or itching. HEAD- No Headache or dizziness. RESPIRATORY- No Cough or SOB. CARDIAC- No Palpitations,  or chest pain. GI- No vomiting, diarrhoea,  abd pain. URINARY- No Frequency, or dysuria. NEUROLOGIC- No Numbness, or burning. [facility]- Denies depression or anxiety.  Objective:  Physical Exam: Filed Vitals:   12/24/14 0907  BP: 146/96  Pulse: 73  Temp: 98.2 F (36.8 C)  TempSrc: Oral  Height: 6\' 2"  (1.88 m)  Weight: 203 lb (92.08 kg)  SpO2: 98%   GENERAL- alert, co-operative, appears as stated age, not in any distress. HEENT- Atraumatic, normocephalic, PERRL,   CARDIAC- RRR, no murmurs, rubs or gallops. RESP- Moving equal volumes of air, and clear to auscultation bilaterally, no wheezes or crackles. ABDOMEN- Soft, nontender, bowel sounds present. NEURO- Alert and oriented, Gait- Normal. EXTREMITIES- pulse 2+, symmetric, no pedal edema. Right shoulder- limited abduction, cannot abduct past 90 degrees, normal and controlled adduction, pain on active flexion and extension against resistance. Tenderness on palpation superior and anterior aspect of shoulder. No obvious shoulder swelling around joint.  SKIN- Warm, dry, No  rash or lesion. PSYCH- Normal mood and affect, appropriate thought content and speech.  Assessment & Plan:  The patient's case and plan of care was discussed with attending physician, Dr. Dalphine Handing.  Please see problem based charting for assessment and plan.

## 2014-12-24 NOTE — Progress Notes (Signed)
Internal Medicine Clinic Attending  Case discussed with Dr. Emokpae at the time of the visit.  We reviewed the resident's history and exam and pertinent patient test results.  I agree with the assessment, diagnosis, and plan of care documented in the resident's note.  

## 2014-12-24 NOTE — Assessment & Plan Note (Signed)
Pt presented with persistent pain. He says injury was about 3 weeks ago. Associated stiffness in the morning that resolves by later that day- Afternoon. Pain wakes him up at night, especially when he sleeps on affected shoulder. Exam- negative drop arm test, but inability to raise his arm past 90 degrees suggest rotator cuff tear, Also consider Deltoid muscle sprain, considering tenderness. Xray shoulder- showed subacromial spurring and narrowing in joint. Was given Naproxen without relief, ketorolac given last clinic visit was effective for 3 days.  Plan- Will avoid NSAIDs considering past hx of PUD with hemorrhagic shock, also NSAIDs not helping. - Hydrocodone-acetaminophen 5-325 1-2 tabs Q6h, 330pills - Pt cautioned it may take up to 6 weeks for shoulder to heal, See in 1 month, if no response to treatment, consider sports medicine refferal/ imaging with MRI.

## 2015-02-06 ENCOUNTER — Ambulatory Visit (INDEPENDENT_AMBULATORY_CARE_PROVIDER_SITE_OTHER): Payer: BLUE CROSS/BLUE SHIELD | Admitting: Internal Medicine

## 2015-02-06 ENCOUNTER — Encounter: Payer: Self-pay | Admitting: Internal Medicine

## 2015-02-06 VITALS — BP 151/99 | HR 95 | Temp 97.1°F | Wt 203.1 lb

## 2015-02-06 DIAGNOSIS — I10 Essential (primary) hypertension: Secondary | ICD-10-CM

## 2015-02-06 DIAGNOSIS — M25511 Pain in right shoulder: Secondary | ICD-10-CM | POA: Diagnosis not present

## 2015-02-06 NOTE — Assessment & Plan Note (Addendum)
BP Readings from Last 3 Encounters:  02/06/15 151/99  12/24/14 146/96  12/17/14 152/101    Lab Results  Component Value Date   NA 138 08/16/2014   K 4.1 08/16/2014   CREATININE 1.09 08/16/2014    Assessment: Blood pressure control:  Above goal Comments: Compliant with Lisinopril HCTZ 20-25  Plan: Medications:  continue current medications Educational resources provided:   Self management tools provided:   Other plans: Will check BMP and BP recheck at next visit. Consider increasing dose if still elevated above goal. -Continues to abstain from smoking

## 2015-02-06 NOTE — Progress Notes (Signed)
   Subjective:    Patient ID: Antonio Ramos, male    DOB: 1960/11/03, 54 y.o.   MRN: [id number]  HPI Antonio Ramos is a 54 y.o. w PMH HTN who presents to [facility] today for follow-up of R shoulder pain. Please see problem-based charting for further pertinent information.    Review of Systems  Constitutional: Negative for fever and chills.  HENT: Negative for mouth sores and sore throat.   Eyes: Negative for visual disturbance.  Respiratory: Negative for cough and shortness of breath.   Cardiovascular: Negative for chest pain.  Gastrointestinal: Negative for nausea, vomiting and abdominal pain.  Endocrine: Negative for polyuria.  Genitourinary: Negative for dysuria and flank pain.  Musculoskeletal: Positive for arthralgias. Negative for myalgias and back pain.  Skin: Negative for rash.  Neurological: Negative for weakness and headaches.  Hematological: Does not bruise/bleed easily.  Psychiatric/Behavioral: Negative for behavioral problems and agitation.  All other systems reviewed and are negative.      Objective:   Physical Exam  Constitutional: He is oriented to person, place, and time. He appears well-developed and well-nourished. No distress.  HENT:  Head: Normocephalic and atraumatic.  Mouth/Throat: Oropharynx is clear and moist. No oropharyngeal exudate.  Eyes: Conjunctivae are normal. Pupils are equal, round, and reactive to light.  Neck: Normal range of motion. Neck supple.  Cardiovascular: Normal rate, regular rhythm, normal heart sounds and intact distal pulses.  Exam reveals no gallop and no friction rub.   No murmur heard. Pulmonary/Chest: Effort normal and breath sounds normal. No respiratory distress. He has no wheezes.  Abdominal: Soft. Bowel sounds are normal. He exhibits no distension. There is no tenderness.  Musculoskeletal: Normal range of motion. He exhibits no edema or tenderness.  Drop arm negative. Tenderness in the R anterolateral shoulder during abduction and  external rotation. Pain equal during active and passive motion. Abduction, internal rotation eilicit no pain.  Neurological: He is alert and oriented to person, place, and time.  Skin: Skin is warm and dry. He is not diaphoretic.  Psychiatric: He has a normal mood and affect. His behavior is normal.  Nursing note and vitals reviewed.         Assessment & Plan:  Please see problem-based charting for assessment and plan.  Reubin Milan, MD Resident Physician, PGY-1 Department of Internal Medicine [facility]

## 2015-02-06 NOTE — Assessment & Plan Note (Signed)
Patient says pain is improving since last visit 3 weeks ago. 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 He says the injections and hydrocodone/acetaminophen hasn't helped the pain so he has stopped taking them. He has been icing the shoulder and using Icy-Hot which has helped. Most likely rotator cuff tendinitis/impingement, given presentation and occupation as well as exam findings. Deltoid muscle sprain is another plausible. -Counseled patient on continued supportive measures - no need for further pain medications as he is improving and narcotics haven't helped -Will re-assess in ~4 weeks. If continued improvement, nothing to do. But if no response or worsening course, consider shoulder MRI, also possible sports med referral.

## 2015-02-07 NOTE — Progress Notes (Signed)
Internal Medicine Clinic Attending  I saw and evaluated the patient.  I personally confirmed the key portions of the history and exam documented by Dr. Kennedy and I reviewed pertinent patient test results.  The assessment, diagnosis, and plan were formulated together and I agree with the documentation in the resident's note.  

## 2015-04-14 ENCOUNTER — Encounter (HOSPITAL_COMMUNITY): Payer: Self-pay | Admitting: Nurse Practitioner

## 2015-04-14 ENCOUNTER — Emergency Department (HOSPITAL_COMMUNITY)
Admission: EM | Admit: 2015-04-14 | Discharge: 2015-04-14 | Disposition: A | Discharge status: Home / Self Care | Payer: BLUE CROSS/BLUE SHIELD | Attending: Emergency Medicine | Admitting: Emergency Medicine

## 2015-04-14 ENCOUNTER — Emergency Department (HOSPITAL_COMMUNITY): Payer: BLUE CROSS/BLUE SHIELD

## 2015-04-14 DIAGNOSIS — Z79899 Other long term (current) drug therapy: Secondary | ICD-10-CM | POA: Insufficient documentation

## 2015-04-14 DIAGNOSIS — R0789 Other chest pain: Secondary | ICD-10-CM

## 2015-04-14 DIAGNOSIS — Z8711 Personal history of peptic ulcer disease: Secondary | ICD-10-CM | POA: Insufficient documentation

## 2015-04-14 DIAGNOSIS — I252 Old myocardial infarction: Secondary | ICD-10-CM | POA: Diagnosis not present

## 2015-04-14 DIAGNOSIS — Z87891 Personal history of nicotine dependence: Secondary | ICD-10-CM | POA: Insufficient documentation

## 2015-04-14 DIAGNOSIS — I10 Essential (primary) hypertension: Secondary | ICD-10-CM | POA: Diagnosis not present

## 2015-04-14 DIAGNOSIS — R079 Chest pain, unspecified: Secondary | ICD-10-CM | POA: Diagnosis present

## 2015-04-14 HISTORY — DX: Acute myocardial infarction, unspecified: I21.9

## 2015-04-14 HISTORY — DX: Encounter for other specified aftercare: Z51.89

## 2015-04-14 LAB — I-STAT TROPONIN, ED
TROPONIN I, POC: 0 ng/mL (ref 0.00–0.08)
TROPONIN I, POC: 0.01 ng/mL (ref 0.00–0.08)

## 2015-04-14 LAB — HEPATIC FUNCTION PANEL
ALBUMIN: 4 g/dL (ref 3.5–5.0)
ALT: 21 U/L (ref 17–63)
AST: 26 U/L (ref 15–41)
Alkaline Phosphatase: 74 U/L (ref 38–126)
BILIRUBIN INDIRECT: 0.3 mg/dL (ref 0.3–0.9)
BILIRUBIN TOTAL: 0.6 mg/dL (ref 0.3–1.2)
Bilirubin, Direct: 0.3 mg/dL (ref 0.1–0.5)
Total Protein: 7.2 g/dL (ref 6.5–8.1)

## 2015-04-14 LAB — CBC
HCT: 40.6 % (ref 39.0–52.0)
Hemoglobin: 13.6 g/dL (ref 13.0–17.0)
MCH: 27.9 pg (ref 26.0–34.0)
MCHC: 33.5 g/dL (ref 30.0–36.0)
MCV: 83.4 fL (ref 78.0–100.0)
Platelets: 215 10*3/uL (ref 150–400)
RBC: 4.87 MIL/uL (ref 4.22–5.81)
RDW: 15 % (ref 11.5–15.5)
WBC: 9.7 10*3/uL (ref 4.0–10.5)

## 2015-04-14 LAB — LIPASE, BLOOD: LIPASE: 23 U/L (ref 11–51)

## 2015-04-14 LAB — BASIC METABOLIC PANEL
ANION GAP: 10 (ref 5–15)
BUN: 13 mg/dL (ref 6–20)
CALCIUM: 9.5 mg/dL (ref 8.9–10.3)
CO2: 25 mmol/L (ref 22–32)
Chloride: 105 mmol/L (ref 101–111)
Creatinine, Ser: 1.06 mg/dL (ref 0.61–1.24)
GFR calc Af Amer: >60 mL/min (ref >60)
Glucose, Bld: 92 mg/dL (ref 65–99)
Potassium: 4.7 mmol/L (ref 3.5–5.1)
SODIUM: 140 mmol/L (ref 135–145)

## 2015-04-14 LAB — D-DIMER, QUANTITATIVE (NOT AT ARMC): D DIMER QUANT: 1.12 ug{FEU}/mL — AB (ref 0.00–0.50)

## 2015-04-14 MED ORDER — IOHEXOL 350 MG/ML SOLN
100.0000 mL | Freq: Once | INTRAVENOUS | Status: AC | PRN
  Administered 2015-04-14: 100 mL via INTRAVENOUS

## 2015-04-14 MED ORDER — LANSOPRAZOLE 30 MG PO CPDR: 30.0000 mg | DELAYED_RELEASE_CAPSULE | Freq: Every day | ORAL | Status: DC

## 2015-04-14 MED ORDER — NITROGLYCERIN 2 % TD OINT
1.0000 [in_us] | TOPICAL_OINTMENT | Freq: Once | TRANSDERMAL | Status: AC
  Administered 2015-04-14: 1 [in_us] via TOPICAL
  Filled 2015-04-14: qty 1

## 2015-04-14 NOTE — ED Notes (Signed)
Pt reports onset cp while resting after eating a cold cut sandwich. He describes as a pain all over his chest when breathing. He did feel sweaty and lightheaded with the pain. He denies sob, nausea. Pain is better since onset, but continues to increase with inspiration. A&Ox4, resp e/u

## 2015-04-14 NOTE — Discharge Instructions (Signed)
Nonspecific Chest Pain  °Chest pain can be caused by many different conditions. There is always a chance that your pain could be related to something serious, such as a heart attack or a blood clot in your lungs. Chest pain can also be caused by conditions that are not life-threatening. If you have chest pain, it is very important to follow up with your health care provider. °CAUSES  °Chest pain can be caused by: °· Heartburn. °· Pneumonia or bronchitis. °· Anxiety or stress. °· Inflammation around your heart (pericarditis) or lung (pleuritis or pleurisy). °· A blood clot in your lung. °· A collapsed lung (pneumothorax). It can develop suddenly on its own (spontaneous pneumothorax) or from trauma to the chest. °· Shingles infection (varicella-zoster virus). °· Heart attack. °· Damage to the bones, muscles, and cartilage that make up your chest wall. This can include: °· Bruised bones due to injury. °· Strained muscles or cartilage due to frequent or repeated coughing or overwork. °· Fracture to one or more ribs. °· Sore cartilage due to inflammation (costochondritis). °RISK FACTORS  °Risk factors for chest pain may include: °· Activities that increase your risk for trauma or injury to your chest. °· Respiratory infections or conditions that cause frequent coughing. °· Medical conditions or overeating that can cause heartburn. °· Heart disease or family history of heart disease. °· Conditions or health behaviors that increase your risk of developing a blood clot. °· Having had chicken pox (varicella zoster). °SIGNS AND SYMPTOMS °Chest pain can feel like: °· Burning or tingling on the surface of your chest or deep in your chest. °· Crushing, pressure, aching, or squeezing pain. °· Dull or sharp pain that is worse when you move, cough, or take a deep breath. °· Pain that is also felt in your back, neck, shoulder, or arm, or pain that spreads to any of these areas. °Your chest pain may come and go, or it may stay  constant. °DIAGNOSIS °Lab tests or other studies may be needed to find the cause of your pain. Your health care provider may have you take a test called an ambulatory ECG (electrocardiogram). An ECG records your heartbeat patterns at the time the test is performed. You may also have other tests, such as: °· Transthoracic echocardiogram (TTE). During echocardiography, sound waves are used to create a picture of all of the heart structures and to look at how blood flows through your heart. °· Transesophageal echocardiogram (TEE). This is a more advanced imaging test that obtains images from inside your body. It allows your health care provider to see your heart in finer detail. °· Cardiac monitoring. This allows your health care provider to monitor your heart rate and rhythm in real time. °· Holter monitor. This is a portable device that records your heartbeat and can help to diagnose abnormal heartbeats. It allows your health care provider to track your heart activity for several days, if needed. °· Stress tests. These can be done through exercise or by taking medicine that makes your heart beat more quickly. °· Blood tests. °· Imaging tests. °TREATMENT  °Your treatment depends on what is causing your chest pain. Treatment may include: °· Medicines. These may include: °· Acid blockers for heartburn. °· Anti-inflammatory medicine. °· Pain medicine for inflammatory conditions. °· Antibiotic medicine, if an infection is present. °· Medicines to dissolve blood clots. °· Medicines to treat coronary artery disease. °· Supportive care for conditions that do not require medicines. This may include: °· Resting. °· Applying heat   or cold packs to injured areas. °· Limiting activities until pain decreases. °HOME CARE INSTRUCTIONS °· If you were prescribed an antibiotic medicine, finish it all even if you start to feel better. °· Avoid any activities that bring on chest pain. °· Do not use any tobacco products, including  cigarettes, chewing tobacco, or electronic cigarettes. If you need help quitting, ask your health care provider. °· Do not drink alcohol. °· Take medicines only as directed by your health care provider. °· Keep all follow-up visits as directed by your health care provider. This is important. This includes any further testing if your chest pain does not go away. °· If heartburn is the cause for your chest pain, you may be told to keep your head raised (elevated) while sleeping. This reduces the chance that acid will go from your stomach into your esophagus. °· Make lifestyle changes as directed by your health care provider. These may include: °¨ Getting regular exercise. Ask your health care provider to suggest some activities that are safe for you. °¨ Eating a heart-healthy diet. A registered dietitian can help you to learn healthy eating options. °¨ Maintaining a healthy weight. °¨ Managing diabetes, if necessary. °¨ Reducing stress. °SEEK MEDICAL CARE IF: °· Your chest pain does not go away after treatment. °· You have a rash with blisters on your chest. °· You have a fever. °SEEK IMMEDIATE MEDICAL CARE IF:  °· Your chest pain is worse. °· You have an increasing cough, or you cough up blood. °· You have severe abdominal pain. °· You have severe weakness. °· You faint. °· You have chills. °· You have sudden, unexplained chest discomfort. °· You have sudden, unexplained discomfort in your arms, back, neck, or jaw. °· You have shortness of breath at any time. °· You suddenly start to sweat, or your skin gets clammy. °· You feel nauseous or you vomit. °· You suddenly feel light-headed or dizzy. °· Your heart begins to beat quickly, or it feels like it is skipping beats. °These symptoms may represent a serious problem that is an emergency. Do not wait to see if the symptoms will go away. Get medical help right away. Call your local emergency services (911 in the [place] Do not drive yourself to the hospital. °  °This  information is not intended to replace advice given to you by your health care provider. Make sure you discuss any questions you have with your health care provider. °  °Document Released: 12/31/2004 Document Revised: 04/13/2014 Document Reviewed: 10/27/2013 °Elsevier Interactive Patient Education ©2016 [employer] ° °Gastroesophageal Reflux Disease, Adult °Normally, food travels down the esophagus and stays in the stomach to be digested. However, when a person has gastroesophageal reflux disease (GERD), food and stomach acid move back up into the esophagus. When this happens, the esophagus becomes sore and inflamed. Over time, GERD can create small holes (ulcers) in the lining of the esophagus.  °CAUSES °This condition is caused by a problem with the muscle between the esophagus and the stomach (lower esophageal sphincter, or LES). Normally, the LES muscle closes after food passes through the esophagus to the stomach. When the LES is weakened or abnormal, it does not close properly, and that allows food and stomach acid to go back up into the esophagus. The LES can be weakened by certain dietary substances, medicines, and medical conditions, including: °· Tobacco use. °· Pregnancy. °· Having a hiatal hernia. °· Heavy alcohol use. °· Certain foods and beverages, such as coffee,   chocolate, onions, and peppermint. °RISK FACTORS °This condition is more likely to develop in: °· People who have an increased body weight. °· People who have connective tissue disorders. °· People who use NSAID medicines. °SYMPTOMS °Symptoms of this condition include: °· Heartburn. °· Difficult or painful swallowing. °· The feeling of having a lump in the throat. °· A bitter taste in the mouth. °· Bad breath. °· Having a large amount of saliva. °· Having an upset or bloated stomach. °· Belching. °· Chest pain. °· Shortness of breath or wheezing. °· Ongoing (chronic) cough or a night-time cough. °· Wearing away of tooth enamel. °· Weight  loss. °Different conditions can cause chest pain. Make sure to see your health care provider if you experience chest pain. °DIAGNOSIS °Your health care provider will take a medical history and perform a physical exam. To determine if you have mild or severe GERD, your health care provider may also monitor how you respond to treatment. You may also have other tests, including: °· An endoscopy to examine your stomach and esophagus with a small camera. °· A test that measures the acidity level in your esophagus. °· A test that measures how much pressure is on your esophagus. °· A barium swallow or modified barium swallow to show the shape, size, and functioning of your esophagus. °TREATMENT °The goal of treatment is to help relieve your symptoms and to prevent complications. Treatment for this condition may vary depending on how severe your symptoms are. Your health care provider may recommend: °· Changes to your diet. °· Medicine. °· Surgery. °HOME CARE INSTRUCTIONS °Diet °· Follow a diet as recommended by your health care provider. This may involve avoiding foods and drinks such as: °¨ Coffee and tea (with or without caffeine). °¨ Drinks that contain alcohol. °¨ Energy drinks and sports drinks. °¨ Carbonated drinks or sodas. °¨ Chocolate and cocoa. °¨ Peppermint and mint flavorings. °¨ Garlic and onions. °¨ Horseradish. °¨ Spicy and acidic foods, including peppers, chili powder, curry powder, vinegar, hot sauces, and barbecue sauce. °¨ Citrus fruit juices and citrus fruits, such as oranges, lemons, and limes. °¨ Tomato-based foods, such as red sauce, chili, salsa, and pizza with red sauce. °¨ Fried and fatty foods, such as donuts, french fries, potato chips, and high-fat dressings. °¨ High-fat meats, such as hot dogs and fatty cuts of red and white meats, such as rib eye steak, sausage, ham, and bacon. °¨ High-fat dairy items, such as whole milk, butter, and cream cheese. °· Eat small, frequent meals instead of large  meals. °· Avoid drinking large amounts of liquid with your meals. °· Avoid eating meals during the 2-3 hours before bedtime. °· Avoid lying down right after you eat. °· Do not exercise right after you eat. ° General Instructions  °· Pay attention to any changes in your symptoms. °· Take over-the-counter and prescription medicines only as told by your health care provider. Do not take aspirin, ibuprofen, or other NSAIDs unless your health care provider told you to do so. °· Do not use any tobacco products, including cigarettes, chewing tobacco, and e-cigarettes. If you need help quitting, ask your health care provider. °· Wear loose-fitting clothing. Do not wear anything tight around your waist that causes pressure on your abdomen. °· Raise (elevate) the head of your bed 6 inches (15cm). °· Try to reduce your stress, such as with yoga or meditation. If you need help reducing stress, ask your health care provider. °· If you are overweight, reduce your weight to an amount that is   healthy for you. Ask your health care provider for guidance about a safe weight loss goal. °· Keep all follow-up visits as told by your health care provider. This is important. °SEEK MEDICAL CARE IF: °· You have new symptoms. °· You have unexplained weight loss. °· You have difficulty swallowing, or it hurts to swallow. °· You have wheezing or a persistent cough. °· Your symptoms do not improve with treatment. °· You have a hoarse voice. °SEEK IMMEDIATE MEDICAL CARE IF: °· You have pain in your arms, neck, jaw, teeth, or back. °· You feel sweaty, dizzy, or light-headed. °· You have chest pain or shortness of breath. °· You vomit and your vomit looks like blood or coffee grounds. °· You faint. °· Your stool is bloody or black. °· You cannot swallow, drink, or eat. °  °This information is not intended to replace advice given to you by your health care provider. Make sure you discuss any questions you have with your health care provider. °    °Document Released: 12/31/2004 Document Revised: 12/12/2014 Document Reviewed: 07/18/2014 °Elsevier Interactive Patient Education ©2016 [employer] ° °

## 2015-04-14 NOTE — ED Provider Notes (Signed)
CSN: [id number]     Arrival date & time 04/14/15  1551 History   First MD Initiated Contact with Patient 04/14/15 1659     Chief Complaint  Patient presents with  . Chest Pain     (Consider location/radiation/quality/duration/timing/severity/associated sxs/prior Treatment) HPI Patient presents with chest pain waking from sleep around 1:30 this afternoon. States the pain is in the center of the chest. States it feels tight like he is a large meal. Pain is worse with deep breathing. Denies any shortness of breath or cough. No fever or chills. Denies any nausea, vomiting or abdominal pain. No new lower extremity swelling or pain. Past Medical History  Diagnosis Date  . Hypertension   . PUD (peptic ulcer disease)     Diagnosed in 1986, with 3 prior bleeds since that time, most recently in 2004  . Hemorrhagic shock 11/23/2012  . MI (myocardial infarction) (HCC)   . Blood transfusion without reported diagnosis    Past Surgical History  Procedure Laterality Date  . Upper gi endoscopy    . Esophagogastroduodenoscopy N/A 11/23/2012    Procedure: ESOPHAGOGASTRODUODENOSCOPY (EGD);  Surgeon: Petra Kuba, MD;  Location: [facility] ENDOSCOPY;  Service: Endoscopy;  Laterality: N/A;   Family History  Problem Relation Age of Onset  . Heart attack Father     at age of 16   Social History  Substance Use Topics  . Smoking status: Former Smoker -- 0.20 packs/day for 24 years    Types: Cigarettes    Quit date: 03/17/2014  . Smokeless tobacco: None  . Alcohol Use: No     Comment: states used to drink heavily in 1983    Review of Systems  Constitutional: Negative for fever and chills.  Respiratory: Negative for cough and shortness of breath.   Cardiovascular: Positive for chest pain. Negative for palpitations and leg swelling.  Gastrointestinal: Negative for nausea, vomiting and abdominal pain.  Musculoskeletal: Negative for back pain, neck pain and neck stiffness.  Skin: Negative for rash and wound.   Neurological: Negative for dizziness, weakness, light-headedness, numbness and headaches.  All other systems reviewed and are negative.     Allergies  Review of patient's allergies indicates no known allergies.  Home Medications   Prior to Admission medications   Medication Sig Start Date End Date Taking? Authorizing Provider  HYDROcodone-acetaminophen (NORCO/VICODIN) 5-325 MG per tablet Take 1-2 tablets by mouth every 6 (six) hours as needed for moderate pain. 12/24/14  Yes Ejiroghene E Emokpae, MD  lisinopril-hydrochlorothiazide (PRINZIDE,ZESTORETIC) 20-25 MG per tablet Take 1 tablet by mouth daily. 08/16/14  Yes Gust Rung, DO  albuterol (PROVENTIL HFA;VENTOLIN HFA) 108 (90 BASE) MCG/ACT inhaler Inhale 1-2 puffs into the lungs every 6 (six) hours as needed for wheezing or shortness of breath. Patient not taking: Reported on 12/17/2014 02/04/14   Blane Ohara, MD  lansoprazole (PREVACID) 30 MG capsule Take 1 capsule (30 mg total) by mouth daily at 12 noon. 04/14/15   Loren Racer, MD   BP 129/81 mmHg  Pulse 82  Temp(Src) 98.7 F (37.1 C) (Oral)  Resp 17  SpO2 95% Physical Exam  Constitutional: He is oriented to person, place, and time. He appears well-developed and well-nourished. No distress.  HENT:  Head: Normocephalic and atraumatic.  Mouth/Throat: Oropharynx is clear and moist.  Eyes: EOM are normal. Pupils are equal, round, and reactive to light.  Neck: Normal range of motion. Neck supple. No JVD present.  Cardiovascular: Normal rate and regular rhythm.  Exam reveals no gallop  and no friction rub.   No murmur heard. Pulmonary/Chest: Effort normal and breath sounds normal. No respiratory distress. He has no wheezes. He has no rales. He exhibits no tenderness.  Abdominal: Soft. Bowel sounds are normal. He exhibits no distension and no mass. There is no tenderness. There is no rebound and no guarding.  Musculoskeletal: Normal range of motion. He exhibits no edema or  tenderness.  No lower extremity swelling or pain. Distal pulses intact.  Neurological: He is alert and oriented to person, place, and time.  Moving all extremities without deficit. Sensation is fully intact. Ambulating without difficulty  Skin: Skin is warm and dry. No rash noted. No erythema.  Psychiatric: He has a normal mood and affect. His behavior is normal.  Nursing note and vitals reviewed.   ED Course  Procedures (including critical care time) Labs Review Labs Reviewed  D-DIMER, QUANTITATIVE (NOT AT [facility]) - Abnormal; Notable for the following:    D-Dimer, Quant 1.12 (*)    All other components within normal limits  BASIC METABOLIC PANEL  HEPATIC FUNCTION PANEL  LIPASE, BLOOD  CBC  I-STAT TROPOININ, ED  Rosezena Sensor, ED    Imaging Review Dg Chest 2 View  04/14/2015  CLINICAL DATA:  Midsternal chest pain with deep inspiration today. Smoker. EXAM: CHEST  2 VIEW COMPARISON:  PA and lateral chest 02/04/2014. FINDINGS: Mild eventration of the right hemidiaphragm is identified. There is some streaky opacity in the right lung base. The left lung is clear. Heart size is normal. No pneumothorax or pleural effusion. IMPRESSION: Streaky opacity right lung base is likely due to atelectasis. Electronically Signed   By: Drusilla Kanner M.D.   On: 04/14/2015 17:43   Ct Angio Chest Pe W/cm &/or Wo Cm  04/14/2015  CLINICAL DATA:  Acute onset of chest pain. EXAM: CT ANGIOGRAPHY CHEST WITH CONTRAST TECHNIQUE: Multidetector CT imaging of the chest was performed using the standard protocol during bolus administration of intravenous contrast. Multiplanar CT image reconstructions and MIPs were obtained to evaluate the vascular anatomy. CONTRAST:  OMNIPAQUE IOHEXOL 350 MG/ML SOLN COMPARISON:  Radiographs earlier this day. FINDINGS: There are no filling defects within the pulmonary arteries to suggest pulmonary embolus. Thoracic aorta is normal in caliber. The heart is normal in size. No  pericardial effusion. No pleural effusion. Mild apical predominant emphysema. Linear atelectasis in both lower lobes and lingula. No pulmonary mass or suspicious nodule. Patulous esophagus without wall thickening. No acute abnormality in the included upper abdomen. There are no acute or suspicious osseous abnormalities. Review of the MIP images confirms the above findings. IMPRESSION: 1. No pulmonary embolus or acute intrathoracic process. 2. Mild emphysema. Electronically Signed   By: Rubye Oaks M.D.   On: 04/14/2015 20:34   I have personally reviewed and evaluated these images and lab results as part of my medical decision-making.   EKG Interpretation   Date/Time:  Sunday April 14 2015 16:38:29 EST Ventricular Rate:  73 PR Interval:  142 QRS Duration: 72 QT Interval:  362 QTC Calculation: 398 R Axis:   72 Text Interpretation:  Normal sinus rhythm Possible Left atrial enlargement  ST \\T \ T wave abnormality, consider inferolateral ischemia Abnormal ECG  Confirmed by Lincoln Brigham [phone]) on 04/14/2015 4:47:14 PM      MDM   Final diagnoses:  Atypical chest pain   Patient states he cannot take aspirin due to peptic ulcer disease and multiple episodes of GI bleeding.   Patient or well-appearing. Chest pain is resolved.  Elevation in d-dimer but CT chest without any acute abnormality including no evidence of PE. Troponin 2 is normal. Low suspicion for coronary artery disease. Likely gastric intestinal related. Will start on PPI and have follow up with gastrology. Given the patient's risk factors also will give cardiology follow-up. Return precautions have been given.  Loren Racer, MD 04/14/15 [phone]

## 2015-04-14 NOTE — ED Notes (Signed)
Patient transported to CT 

## 2015-04-18 ENCOUNTER — Ambulatory Visit (INDEPENDENT_AMBULATORY_CARE_PROVIDER_SITE_OTHER): Payer: BLUE CROSS/BLUE SHIELD | Admitting: Internal Medicine

## 2015-04-18 ENCOUNTER — Encounter: Payer: Self-pay | Admitting: Internal Medicine

## 2015-04-18 VITALS — BP 135/95 | HR 89 | Temp 99.2°F | Ht 72.0 in | Wt 203.4 lb

## 2015-04-18 DIAGNOSIS — M25511 Pain in right shoulder: Secondary | ICD-10-CM

## 2015-04-18 DIAGNOSIS — I10 Essential (primary) hypertension: Secondary | ICD-10-CM

## 2015-04-18 DIAGNOSIS — Z8711 Personal history of peptic ulcer disease: Secondary | ICD-10-CM | POA: Diagnosis not present

## 2015-04-18 DIAGNOSIS — K279 Peptic ulcer, site unspecified, unspecified as acute or chronic, without hemorrhage or perforation: Secondary | ICD-10-CM

## 2015-04-18 DIAGNOSIS — Z720 Tobacco use: Secondary | ICD-10-CM

## 2015-04-18 DIAGNOSIS — Z9189 Other specified personal risk factors, not elsewhere classified: Secondary | ICD-10-CM

## 2015-04-18 MED ORDER — HYDROCODONE-ACETAMINOPHEN 5-325 MG PO TABS: 1.0000 | ORAL_TABLET | Freq: Four times a day (QID) | ORAL | Status: DC | PRN

## 2015-04-18 NOTE — Patient Instructions (Signed)
General Instructions: Please continue to take Lansoprazole 15mg  twice a day.  I am referring you back to Dr Ewing Schlein for evaluation.   Please do the shoulder exercises.  If still bothersome please call to come back in and we can do a shoulder injection, let the front office know that is what your want.  Please try to bring all your medicines next time. This will help [place] keep you safe from mistakes.   Progress Toward Treatment Goals:  Treatment Goal 08/16/2014  Blood pressure deteriorated  Stop smoking -    Self Care Goals & Plans:  Self Care Goal 04/18/2015  Manage my medications take my medicines as prescribed; bring my medications to every visit; refill my medications on time  Monitor my health -  Eat healthy foods eat more vegetables; eat foods that are low in salt; eat baked foods instead of fried foods  Be physically active take a walk every day  Stop smoking -    No flowsheet data found.   Care Management & Community Referrals:  Referral 08/16/2014  Referrals made for care management support none needed

## 2015-04-21 NOTE — Assessment & Plan Note (Signed)
-   He has had an extensive history of GI ulcers and from chart review it appears that the PPI was costing him a lot and he was allowed to D/C about a year ago.  From his reports of chest pain it does seem likely that he has another ulcer.  I agree with the PPI and will also have him follow up with GI as he likely needs another scope and potentially repeat testing for H Pylori. -For now will plan on indefinite PPI

## 2015-04-21 NOTE — Assessment & Plan Note (Signed)
A: Essential Hypertension: uncontrolled  P:  Diastolic mildly elevated.  Encouraged strict adherence to BP meds and salt reduction.  Will continue Lisinopril- HCTZ 20-25.

## 2015-04-21 NOTE — Progress Notes (Signed)
[facility] Subjective:   Patient ID: Antonio Ramos male   DOB: 1960/11/21 55 y.o.   MRN: [id number]  HPI: Antonio Ramos is a 55 y.o. male with a PMH detailed below who presents for ED follow up.  He went the the ED for chest pain on 04/14/15.  He received a CTA which ruled out PE.  And EKG and 2 troponin's were checked which did not show signs of ischemic heart disease.  He was felt to likely have an ulcer and discharged with a PPI.  He reports lansoprazole 60mg  was too expensive but he was able to buy OTC lansoprazole 15mg  and has been taking 2 capsules.  He reports that his chest pain has improved greatly with taking this medication.  Previously the pain was not worse with exertion.  It was worsened by food.  Of note in the ED he told the nurses that he had a MI before.  In asking about this he notes that he presented 10-15 years ago in [place] with chest pain and was told he had a minor heart attack.  He underwent a cath at that time by report and was discharged with an "acid blocker."   He also notes that he has continued to be bothered by right shoulder pain.   Past Medical History  Diagnosis Date  . Hypertension   . PUD (peptic ulcer disease)     Diagnosed in 1986, with 3 prior bleeds since that time, most recently in 2004  . Hemorrhagic shock 11/23/2012  . MI (myocardial infarction) (HCC)     per patient report, unconfimred  . Blood transfusion without reported diagnosis    Current Outpatient Prescriptions  Medication Sig Dispense Refill  . albuterol (PROVENTIL HFA;VENTOLIN HFA) 108 (90 BASE) MCG/ACT inhaler Inhale 1-2 puffs into the lungs every 6 (six) hours as needed for wheezing or shortness of breath. (Patient not taking: Reported on 12/17/2014) 1 Inhaler 0  . HYDROcodone-acetaminophen (NORCO/VICODIN) 5-325 MG tablet Take 1-2 tablets by mouth every 6 (six) hours as needed for moderate pain. 20 tablet 0  . lansoprazole (PREVACID) 30 MG capsule Take 1 capsule (30  mg total) by mouth daily at 12 noon. 30 capsule 0  . lisinopril-hydrochlorothiazide (PRINZIDE,ZESTORETIC) 20-25 MG per tablet Take 1 tablet by mouth daily. 30 tablet 11   No current facility-administered medications for this visit.   Family History  Problem Relation Age of Onset  . Heart attack Father     at age of 45   Social History   Social History  . Marital Status: Married    Spouse Name: N/A  . Number of Children: N/A  . Years of Education: N/A   Social History Main Topics  . Smoking status: Former Smoker -- 0.20 packs/day for 24 years    Types: Cigarettes    Quit date: 03/17/2014  . Smokeless tobacco: None  . 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 Drug Use: No  . Sexual Activity: Not Asked   Other Topics Concern  . None   Social History Narrative   Moved from [place] to [place] in 2009.  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   Review of Systems: Review of Systems  Constitutional: Negative for fever and chills.  Respiratory: Negative for cough and shortness of breath.   Cardiovascular: Negative for chest pain.  Gastrointestinal: Negative for heartburn, nausea, vomiting, abdominal pain, blood in stool and melena.  Genitourinary: Negative for dysuria.  Musculoskeletal: Negative for joint pain.  Neurological: Negative for dizziness.    Objective:  Physical Exam: Filed Vitals:   04/18/15 1502  BP: 135/95  Pulse: 89  Temp: 99.2 F (37.3 C)  TempSrc: Oral  Height: 6' (1.829 m)  Weight: 203 lb 6.4 oz (92.262 kg)  SpO2: 100%  Physical Exam  Constitutional: He is oriented to person, place, and time and well-developed, well-nourished, and in no distress.  HENT:  Head: Normocephalic and atraumatic.  Cardiovascular: Normal rate, regular rhythm and normal heart sounds.   No murmur heard. Pulmonary/Chest: Effort normal and breath sounds normal. No respiratory distress. He has no wheezes.  Abdominal: Soft. Bowel sounds are  normal. There is no tenderness.  Musculoskeletal:       Right shoulder: He exhibits tenderness (AC joint). He exhibits normal range of motion (full ROM but painful arc on abduction), no swelling and no effusion.  Neurological: He is alert and oriented to person, place, and time.  Nursing note and vitals reviewed.   Assessment & Plan:  Case discussed with Dr. Cyndie Chime  Right shoulder pain - Previous Xray should sub acromial spurring and by exam I suspect some mild impengemnt.  Will avoid NSAIDs due to his likely GI ulcer.  He has received some relief from a previous Rx of Norco of which he still has 3 pills remaining.  I will Rx him another 20 pills.  I have also given him exercises to do at home as he does not want a PT referral.  Lastly I think he may benefit from a local steroid injection for which he can schedule with [place] or we can refer him to sports med.  He will think about this and call to schedule.  Risk for coronary artery disease between 10% and 20% in next 10 years per Framingham score I am not sure that he acutally has had a previous MI from his report (since he was discharged on an acid medication) I have asked him to complete a records release form to attain those records. He is at risk for CAD and I previously had prescribed a statin, however he was having shoulder pain after starting that. I did not feel this was related to the statin but he had a mildly elevated CK and the statin was stopped.  I would like to reintroduce a trial of pravastatin or rosuvastatin in the near future but hopefully we can get his shoulder pain better treated first.  PUD (peptic ulcer disease) - He has had an extensive history of GI ulcers and from chart review it appears that the PPI was costing him a lot and he was allowed to D/C about a year ago.  From his reports of chest pain it does seem likely that he has another ulcer.  I agree with the PPI and will also have him follow up with GI as he likely  needs another scope and potentially repeat testing for H Pylori. -For now will plan on indefinite PPI  Essential hypertension A: Essential Hypertension: uncontrolled  P:  Diastolic mildly elevated.  Encouraged strict adherence to BP meds and salt reduction.  Will continue Lisinopril- HCTZ 20-25.    Medications Ordered Meds ordered this encounter  Medications  . HYDROcodone-acetaminophen (NORCO/VICODIN) 5-325 MG tablet    Sig: Take 1-2 tablets by mouth every 6 (six) hours as needed for moderate pain.    Dispense:  20 tablet    Refill:  0   Other Orders  Orders Placed This Encounter  Procedures  . Ambulatory referral to Gastroenterology    Referral Priority:  Routine    Referral Type:  Consultation    Referral Reason:  Specialty Services Required    Number of Visits Requested:  1   Follow Up: Return in about 6 weeks (around 05/30/2015).

## 2015-04-21 NOTE — Assessment & Plan Note (Signed)
-   Previous Xray should sub acromial spurring and by exam I suspect some mild impengemnt.  Will avoid NSAIDs due to his likely GI ulcer.  He has received some relief from a previous Rx of Norco of which he still has 3 pills remaining.  I will Rx him another 20 pills.  I have also given him exercises to do at home as he does not want a PT referral.  Lastly I think he may benefit from a local steroid injection for which he can schedule with [place] or we can refer him to sports med.  He will think about this and call to schedule.

## 2015-04-21 NOTE — Assessment & Plan Note (Signed)
I am not sure that he acutally has had a previous MI from his report (since he was discharged on an acid medication) I have asked him to complete a records release form to attain those records. He is at risk for CAD and I previously had prescribed a statin, however he was having shoulder pain after starting that. I did not feel this was related to the statin but he had a mildly elevated CK and the statin was stopped.  I would like to reintroduce a trial of pravastatin or rosuvastatin in the near future but hopefully we can get his shoulder pain better treated first.

## 2015-04-29 NOTE — Progress Notes (Signed)
Medicine attending: Medical history, presenting problems, physical findings, and medications, reviewed with resident physician Dr Erik Hoffman on the day of the patient visit and I concur with his evaluation and management plan. 

## 2015-05-09 ENCOUNTER — Encounter: Payer: Self-pay | Admitting: Internal Medicine

## 2015-06-27 ENCOUNTER — Telehealth: Payer: Self-pay | Admitting: Internal Medicine

## 2015-06-27 NOTE — Telephone Encounter (Signed)
APT. REMINDER CALL, LMTCB °

## 2015-06-28 ENCOUNTER — Encounter: Payer: Self-pay | Admitting: Internal Medicine

## 2015-06-28 ENCOUNTER — Ambulatory Visit (INDEPENDENT_AMBULATORY_CARE_PROVIDER_SITE_OTHER): Payer: BLUE CROSS/BLUE SHIELD | Admitting: Internal Medicine

## 2015-06-28 VITALS — BP 164/110 | HR 74 | Temp 97.8°F | Ht 72.0 in | Wt 204.0 lb

## 2015-06-28 DIAGNOSIS — I10 Essential (primary) hypertension: Secondary | ICD-10-CM

## 2015-06-28 DIAGNOSIS — M25511 Pain in right shoulder: Secondary | ICD-10-CM

## 2015-06-28 MED ORDER — DICLOFENAC SODIUM 1 % TD GEL: 2.0000 g | Freq: Four times a day (QID) | TRANSDERMAL | Status: DC

## 2015-06-28 MED ORDER — HYDROCODONE-ACETAMINOPHEN 5-325 MG PO TABS: 1.0000 | ORAL_TABLET | Freq: Two times a day (BID) | ORAL | Status: DC | PRN

## 2015-06-28 NOTE — Progress Notes (Signed)
   Subjective:    Patient ID: Antonio Ramos, male    DOB: [birth year], 55 y.o.   MRN: [id number]  HPI  55 yo male with hx of PUD, HTN, ? Hx of MI per patient, here for follow up for right shoulder pain.  Right shoulder pain: started around Dec 2016 from heavy lifting at work. xray showed sub acromial spurring in the past. Was given Norco 20 pils and home exercises. Local steroid injection was offered on last visit. He continues to have right shoulder pain, especially at night time. He sometimes gets swelling of the entire arm when he lies on the right side. No swelling when he lies on the left side. No neck pain, no numbness or tingling. States in the past shoulder injection helped with the pain for few days.   HTN: BP elevated. States compliance with meds. When I asked to go up on this dose of lisinopril-hctz from 20-25 to 40-25, he states he does not want to change anything as in the past he was dizzy with higher dose of meds. He is not able to function at work with higher dose and feels that it's safer for him to stay with his current regimen. I told him this can be further discussed with his primary provider in the future.   Review of Systems  Constitutional: Negative for fever and chills.  HENT: Negative for congestion and sore throat.   Eyes: Negative for photophobia and visual disturbance.  Respiratory: Negative for cough, chest tightness and shortness of breath.   Cardiovascular: Negative for chest pain, palpitations and leg swelling.  Gastrointestinal: Negative for nausea, vomiting, abdominal pain, diarrhea and abdominal distention.  Endocrine: Negative.   Musculoskeletal: Positive for arthralgias. Negative for back pain, neck pain and neck stiffness.  Skin: Negative.   Neurological: Negative for dizziness, numbness and headaches.       Objective:   Physical Exam  Constitutional: He is oriented to person, place, and time. He appears well-developed and well-nourished. No distress.    HENT:  Head: Normocephalic and atraumatic.  Eyes: Conjunctivae are normal. Pupils are equal, round, and reactive to light.  Neck: Normal range of motion. No JVD present.  Cardiovascular: Normal rate and regular rhythm.  Exam reveals no gallop and no friction rub.   No murmur heard. Pulmonary/Chest: Effort normal and breath sounds normal. No respiratory distress. He has no rales. He exhibits no tenderness.  Abdominal: Soft. Bowel sounds are normal. He exhibits no distension. There is no tenderness.  Musculoskeletal: Normal range of motion. He exhibits no edema or tenderness.  Both arms and hands appear equal in size. No joint deformities or swelling noted.   No tenderness on his neck/cspine, upper back, or shoulder area. Has slightly limited extension of the shoulder joint past 90 degrees.   Neurological: He is alert and oriented to person, place, and time.  Skin: He is not diaphoretic.  Psychiatric: He has a normal mood and affect.    Filed Vitals:   06/28/15 1000  BP: 164/110  Pulse: 74  Temp: 97.8 F (36.6 C)         Assessment & Plan:  See problem based a&p.

## 2015-06-28 NOTE — Patient Instructions (Signed)
Please see the sport medicine doctors.  Try the voltaren gel.   Use norco only if needed for severe pain.  Follow up in 1 month with your primary doctor for blood pressure check.

## 2015-06-28 NOTE — Assessment & Plan Note (Signed)
Continues to have right shoulder pain likely from previous work related injury to his shoulder. His pain is periarticular in nature, could have mild tendonitis vs micro trauma of his rotator cuff muscles from previous injury.   - will refer him to sports medicine for trial of steroid injection to the shoulder vs MRI and further workup - will do trial of voltaren gel. Also gave him #15 tabs of Norco until he can be seen at the sports medicine clinic.

## 2015-06-28 NOTE — Assessment & Plan Note (Signed)
Filed Vitals:   06/28/15 1000  BP: 164/110  Pulse: 74  Temp: 97.8 F (36.6 C)   BP remains uncontrolled on lisinopril-hctz 20--25 mg daily. I recommended increasing the dose or trying a different class of medication. Patient refusing to try anything else saying this is working perfect for me and in the past he had dizziness on BP meds which he does not have now. I told him to follow up and discuss this with his PCP in 1-2 months.

## 2015-07-01 NOTE — Progress Notes (Signed)
Internal Medicine Clinic Attending  Case discussed with Dr. Ahmed at the time of the visit.  We reviewed the resident's history and exam and pertinent patient test results.  I agree with the assessment, diagnosis, and plan of care documented in the resident's note. 

## 2015-07-31 ENCOUNTER — Telehealth: Payer: Self-pay | Admitting: Internal Medicine

## 2015-07-31 NOTE — Telephone Encounter (Signed)
APPT. REMINDER CALL, LMTCB °

## 2015-08-01 ENCOUNTER — Ambulatory Visit (INDEPENDENT_AMBULATORY_CARE_PROVIDER_SITE_OTHER): Payer: BLUE CROSS/BLUE SHIELD | Admitting: Internal Medicine

## 2015-08-01 ENCOUNTER — Encounter: Payer: Self-pay | Admitting: Internal Medicine

## 2015-08-01 VITALS — BP 158/100 | HR 76 | Temp 98.1°F | Ht 74.0 in | Wt 203.2 lb

## 2015-08-01 DIAGNOSIS — I10 Essential (primary) hypertension: Secondary | ICD-10-CM | POA: Diagnosis not present

## 2015-08-01 DIAGNOSIS — M25511 Pain in right shoulder: Secondary | ICD-10-CM | POA: Diagnosis not present

## 2015-08-01 MED ORDER — HYDROCODONE-ACETAMINOPHEN 5-325 MG PO TABS: 1.0000 | ORAL_TABLET | Freq: Two times a day (BID) | ORAL | Status: DC | PRN

## 2015-08-01 MED ORDER — AMLODIPINE BESYLATE 5 MG PO TABS: 5.0000 mg | ORAL_TABLET | Freq: Every day | ORAL | Status: DC

## 2015-08-01 NOTE — Assessment & Plan Note (Signed)
BP Readings from Last 3 Encounters:  08/01/15 158/100  06/28/15 164/110  04/18/15 135/95    Lab Results  Component Value Date   NA 140 04/14/2015   K 4.7 04/14/2015   CREATININE 1.06 04/14/2015    Assessment: Blood pressure control:  not controlled Progress toward BP goal:   not at goal Comments: did not want to go up on BP meds last time due to dizziness. 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 He is eating and drinking well, denies n/v, and diarrhea.   Plan: Medications:  He was counseled on long term effects of HTN. He is agreeable to trying norvasc 5mg . If he gets dizzy he will just stop taking norvasc but continue taking lisinopril/HCTZ pill Educational resources provided:   Self management tools provided:   Other plans: f/u in 3 months, advised that norco can also cause dizziness.

## 2015-08-01 NOTE — Progress Notes (Signed)
Subjective:   Patient ID: Antonio Ramos male   DOB: February 06, 1961 55 y.o.   MRN: [id number]  HPI: Antonio Ramos is a 55 y.o. with past medical history as outlined below who presents to clinic for BP f/u.   Please see problem list for status of the pt's chronic medical problems.  Past Medical History  Diagnosis Date  . Hypertension   . PUD (peptic ulcer disease)     Diagnosed in 1986, with 3 prior bleeds since that time, most recently in 2004  . Hemorrhagic shock 11/23/2012  . MI (myocardial infarction) (HCC)     per patient report, unconfimred  . Blood transfusion without reported diagnosis    Current Outpatient Prescriptions  Medication Sig Dispense Refill  . albuterol (PROVENTIL HFA;VENTOLIN HFA) 108 (90 BASE) MCG/ACT inhaler Inhale 1-2 puffs into the lungs every 6 (six) hours as needed for wheezing or shortness of breath. (Patient not taking: Reported on 12/17/2014) 1 Inhaler 0  . diclofenac sodium (VOLTAREN) 1 % GEL Apply 2 g topically 4 (four) times daily. 100 g 1  . HYDROcodone-acetaminophen (NORCO/VICODIN) 5-325 MG tablet Take 1 tablet by mouth every 12 (twelve) hours as needed for moderate pain. 15 tablet 0  . lansoprazole (PREVACID) 30 MG capsule Take 1 capsule (30 mg total) by mouth daily at 12 noon. 30 capsule 0  . lisinopril-hydrochlorothiazide (PRINZIDE,ZESTORETIC) 20-25 MG per tablet Take 1 tablet by mouth daily. 30 tablet 11   No current facility-administered medications for this visit.   Family History  Problem Relation Age of Onset  . Heart attack Father     at age of 46   Social History   Social History  . Marital Status: Married    Spouse Name: N/A  . Number of Children: N/A  . Years of Education: N/A   Social History Main Topics  . Smoking status: Former Smoker -- 0.20 packs/day for 24 years    Types: Cigarettes    Quit date: 03/17/2014  . Smokeless tobacco: None  . 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 Drug Use: No  .  Sexual Activity: Not Asked   Other Topics Concern  . None   Social History Narrative   Moved from [place] to [place] in 2009.  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   Review of Systems: Review of Systems  Eyes: Negative for blurred vision (wears his glasses now).  Respiratory: Negative for shortness of breath.   Cardiovascular: Negative for chest pain.  Gastrointestinal: Negative.   Genitourinary: Negative.     Objective:  Physical Exam: Filed Vitals:   08/01/15 0848  BP: 158/100  Pulse: 76  Temp: 98.1 F (36.7 C)  TempSrc: Oral  Weight: 203 lb 3.2 oz (92.171 kg)  SpO2: 100%   Physical Exam  Constitutional: He appears well-developed and well-nourished. No distress.  HENT:  Head: Normocephalic and atraumatic.  Nose: Nose normal.  Cardiovascular: Normal rate, regular rhythm and normal heart sounds.  Exam reveals no gallop and no friction rub.   No murmur heard. Pulmonary/Chest: Effort normal and breath sounds normal. No respiratory distress. He has no wheezes. He has no rales.  Abdominal: Soft. Bowel sounds are normal. He exhibits no distension and no mass. There is no tenderness. There is no rebound and no guarding.  Skin: Skin is warm and dry. No rash noted. He is not diaphoretic. No erythema. No pallor.   Assessment & Plan:  Please see problem based assessment and plan.

## 2015-08-01 NOTE — Assessment & Plan Note (Signed)
Pt here requesting refill of norco. He was only given #15 tabs to get him to his sports medicine appointment however a referral was never placed.   - rx for norco #60 tabs - place sports med referral

## 2015-08-01 NOTE — Progress Notes (Signed)
Case discussed with Dr. Danella Penton at the time of the visit. We reviewed the resident's history and exam and pertinent patient test results. I agree with the assessment, diagnosis, and plan of care documented in the resident's note.  The narco is a one time prescription to get him to the Sports Medicine appointment.  This is not to be automatically refilled, but refilled only if other non-narcotic alternatives have been exhausted.

## 2015-08-01 NOTE — Patient Instructions (Signed)
Start taking norvasc 5mg  daily for your blood pressure.

## 2015-08-01 NOTE — Addendum Note (Signed)
Addended by: Denton Brick on: 08/01/2015 10:05 AM   Modules accepted: Kipp Brood

## 2015-08-15 ENCOUNTER — Telehealth: Payer: Self-pay | Admitting: *Deleted

## 2015-08-15 NOTE — Telephone Encounter (Signed)
Patient calls reporting dizziness when taking "the white blood pressure pill with the pink one". Patient has discontinued the "white pill" due to dizziness. Encouraged patient to schedule an appointment for evaluation, states he will call back for an appointment when he gets his schedule tomorrow.

## 2015-08-28 ENCOUNTER — Ambulatory Visit: Payer: BLUE CROSS/BLUE SHIELD | Admitting: Family Medicine

## 2015-08-28 NOTE — Addendum Note (Signed)
Addended by: Neomia Dear on: 08/28/2015 07:53 PM   Modules accepted: Orders, SmartSet

## 2015-08-29 ENCOUNTER — Encounter: Payer: BLUE CROSS/BLUE SHIELD | Admitting: Internal Medicine

## 2015-09-09 ENCOUNTER — Telehealth: Payer: Self-pay | Admitting: Internal Medicine

## 2015-09-09 NOTE — Telephone Encounter (Signed)
APT. REMINDER CALL, LMTCB °

## 2015-09-10 ENCOUNTER — Ambulatory Visit (INDEPENDENT_AMBULATORY_CARE_PROVIDER_SITE_OTHER): Payer: BLUE CROSS/BLUE SHIELD | Admitting: Internal Medicine

## 2015-09-10 ENCOUNTER — Encounter: Payer: Self-pay | Admitting: Internal Medicine

## 2015-09-10 VITALS — BP 145/104 | HR 78 | Temp 98.0°F | Wt 200.4 lb

## 2015-09-10 DIAGNOSIS — I10 Essential (primary) hypertension: Secondary | ICD-10-CM

## 2015-09-10 NOTE — Patient Instructions (Signed)
Antonio Ramos,  Please keep taking the lisinopril-HCTZ every morning like you have been. Try taking the amlodipine 5mg  in the evening when you get home and you shouldn't get any side effects. Let us know if you have any issues with the medicine.  Thanks, Reubin Milan

## 2015-09-11 NOTE — Progress Notes (Signed)
Internal Medicine Clinic Attending  Case discussed with Dr. Kennedy at the time of the visit.  We reviewed the resident's history and exam and pertinent patient test results.  I agree with the assessment, diagnosis, and plan of care documented in the resident's note.  

## 2015-09-11 NOTE — Assessment & Plan Note (Signed)
BP Readings from Last 3 Encounters:  09/10/15 145/104  08/01/15 158/100  06/28/15 164/110    Lab Results  Component Value Date   NA 140 04/14/2015   K 4.7 04/14/2015   CREATININE 1.06 04/14/2015    Assessment: Blood pressure control:  near goal Progress toward BP goal:   near goal Comments: Continues to take lisinopril HCTZ combo. Was started on amlodipine at last visit but got dizzy and had a nosebleed when taking the combo pill and amlodipine at the same time the morning. Only took for a few days and not taking anymore. Now just back to the combo pill. Is agreeable to trying the combo pill in the morning like he has been, and the amlodipine at night  Plan: Medications:  Lisinopril HCTZ pill in a.m., amlodipine 5 mg in p.m. Other plans: Follow-up in 3 months. Told to notify us if he is having side effects

## 2015-09-11 NOTE — Progress Notes (Signed)
   Patient ID: Antonio Ramos male   DOB: Sep 24, 1960 55 y.o.   MRN: [id number]  Subjective:   HPI: Antonio Ramos is a 55 y.o. with PMH of HTN and R shoulder pain who presents to [facility] today for HTN follow-up.   Please see problem-based charting for status of medical issues pertinent to this visit.  Review of Systems: Pertinent items noted in HPI and remainder of comprehensive ROS otherwise negative.  Objective:  Physical Exam: Filed Vitals:   09/10/15 0844  BP: 145/104  Pulse: 78  Temp: 98 F (36.7 C)  TempSrc: Oral  Weight: 200 lb 6.4 oz (90.901 kg)  SpO2: 99%   Gen: Well-appearing, alert and oriented to person, place, and time HEENT: Oropharynx clear without erythema or exudate.  Neck: No cervical LAD, no thyromegaly or nodules, no JVD noted. CV: Normal rate, regular rhythm, no murmurs, rubs, or gallops Pulmonary: Normal effort, CTA bilaterally, no wheezing, rales, or rhonchi Abdominal: Soft, non-tender, non-distended, without rebound, guarding, or masses Extremities: Distal pulses 2+ in upper and lower extremities bilaterally, no tenderness, erythema or edema Skin: No atypical appearing moles. No rashes  Assessment & Plan:  Please see problem-based charting for assessment and plan.  Reubin Milan, MD Resident Physician, PGY-1 Department of Internal Medicine [facility]

## 2015-10-02 ENCOUNTER — Encounter (HOSPITAL_COMMUNITY): Payer: Self-pay | Admitting: Emergency Medicine

## 2015-10-02 ENCOUNTER — Emergency Department (HOSPITAL_COMMUNITY)
Admission: EM | Admit: 2015-10-02 | Discharge: 2015-10-02 | Disposition: A | Discharge status: Home / Self Care | Payer: BLUE CROSS/BLUE SHIELD | Attending: Emergency Medicine | Admitting: Emergency Medicine

## 2015-10-02 ENCOUNTER — Emergency Department (HOSPITAL_COMMUNITY): Payer: BLUE CROSS/BLUE SHIELD

## 2015-10-02 DIAGNOSIS — R079 Chest pain, unspecified: Secondary | ICD-10-CM | POA: Diagnosis not present

## 2015-10-02 DIAGNOSIS — I10 Essential (primary) hypertension: Secondary | ICD-10-CM | POA: Diagnosis not present

## 2015-10-02 DIAGNOSIS — Z87891 Personal history of nicotine dependence: Secondary | ICD-10-CM | POA: Insufficient documentation

## 2015-10-02 DIAGNOSIS — I252 Old myocardial infarction: Secondary | ICD-10-CM | POA: Insufficient documentation

## 2015-10-02 DIAGNOSIS — Z79899 Other long term (current) drug therapy: Secondary | ICD-10-CM | POA: Diagnosis not present

## 2015-10-02 LAB — CBC WITH DIFFERENTIAL/PLATELET
BASOS ABS: 0 10*3/uL (ref 0.0–0.1)
BASOS PCT: 1 %
Eosinophils Absolute: 0.1 10*3/uL (ref 0.0–0.7)
Eosinophils Relative: 2 %
HEMATOCRIT: 40.5 % (ref 39.0–52.0)
Hemoglobin: 14 g/dL (ref 13.0–17.0)
LYMPHS PCT: 25 %
Lymphs Abs: 1.4 10*3/uL (ref 0.7–4.0)
MCH: 28.2 pg (ref 26.0–34.0)
MCHC: 34.6 g/dL (ref 30.0–36.0)
MCV: 81.5 fL (ref 78.0–100.0)
MONO ABS: 0.4 10*3/uL (ref 0.1–1.0)
Monocytes Relative: 8 %
Neutro Abs: 3.5 10*3/uL (ref 1.7–7.7)
Neutrophils Relative %: 64 %
PLATELETS: 204 10*3/uL (ref 150–400)
RBC: 4.97 MIL/uL (ref 4.22–5.81)
RDW: 14.8 % (ref 11.5–15.5)
WBC: 5.3 10*3/uL (ref 4.0–10.5)

## 2015-10-02 LAB — TROPONIN I: Troponin I: <0.03 ng/mL (ref <0.03)

## 2015-10-02 LAB — COMPREHENSIVE METABOLIC PANEL
ALBUMIN: 4 g/dL (ref 3.5–5.0)
ALT: 20 U/L (ref 17–63)
ANION GAP: 9 (ref 5–15)
AST: 22 U/L (ref 15–41)
Alkaline Phosphatase: 69 U/L (ref 38–126)
BUN: 11 mg/dL (ref 6–20)
CALCIUM: 9.5 mg/dL (ref 8.9–10.3)
CO2: 25 mmol/L (ref 22–32)
Chloride: 103 mmol/L (ref 101–111)
Creatinine, Ser: 0.98 mg/dL (ref 0.61–1.24)
GFR calc Af Amer: >60 mL/min (ref >60)
GFR calc non Af Amer: >60 mL/min (ref >60)
Glucose, Bld: 84 mg/dL (ref 65–99)
POTASSIUM: 3.5 mmol/L (ref 3.5–5.1)
SODIUM: 137 mmol/L (ref 135–145)
TOTAL PROTEIN: 7 g/dL (ref 6.5–8.1)
Total Bilirubin: 0.5 mg/dL (ref 0.3–1.2)

## 2015-10-02 NOTE — Discharge Instructions (Signed)
Nonspecific Chest Pain  °Chest pain can be caused by many different conditions. There is always a chance that your pain could be related to something serious, such as a heart attack or a blood clot in your lungs. Chest pain can also be caused by conditions that are not life-threatening. If you have chest pain, it is very important to follow up with your health care provider. °CAUSES  °Chest pain can be caused by: °· Heartburn. °· Pneumonia or bronchitis. °· Anxiety or stress. °· Inflammation around your heart (pericarditis) or lung (pleuritis or pleurisy). °· A blood clot in your lung. °· A collapsed lung (pneumothorax). It can develop suddenly on its own (spontaneous pneumothorax) or from trauma to the chest. °· Shingles infection (varicella-zoster virus). °· Heart attack. °· Damage to the bones, muscles, and cartilage that make up your chest wall. This can include: °¨ Bruised bones due to injury. °¨ Strained muscles or cartilage due to frequent or repeated coughing or overwork. °¨ Fracture to one or more ribs. °¨ Sore cartilage due to inflammation (costochondritis). °RISK FACTORS  °Risk factors for chest pain may include: °· Activities that increase your risk for trauma or injury to your chest. °· Respiratory infections or conditions that cause frequent coughing. °· Medical conditions or overeating that can cause heartburn. °· Heart disease or family history of heart disease. °· Conditions or health behaviors that increase your risk of developing a blood clot. °· Having had chicken pox (varicella zoster). °SIGNS AND SYMPTOMS °Chest pain can feel like: °· Burning or tingling on the surface of your chest or deep in your chest. °· Crushing, pressure, aching, or squeezing pain. °· Dull or sharp pain that is worse when you move, cough, or take a deep breath. °· Pain that is also felt in your back, neck, shoulder, or arm, or pain that spreads to any of these areas. °Your chest pain may come and go, or it may stay  constant. °DIAGNOSIS °Lab tests or other studies may be needed to find the cause of your pain. Your health care provider may have you take a test called an ambulatory ECG (electrocardiogram). An ECG records your heartbeat patterns at the time the test is performed. You may also have other tests, such as: °· Transthoracic echocardiogram (TTE). During echocardiography, sound waves are used to create a picture of all of the heart structures and to look at how blood flows through your heart. °· Transesophageal echocardiogram (TEE). This is a more advanced imaging test that obtains images from inside your body. It allows your health care provider to see your heart in finer detail. °· Cardiac monitoring. This allows your health care provider to monitor your heart rate and rhythm in real time. °· Holter monitor. This is a portable device that records your heartbeat and can help to diagnose abnormal heartbeats. It allows your health care provider to track your heart activity for several days, if needed. °· Stress tests. These can be done through exercise or by taking medicine that makes your heart beat more quickly. °· Blood tests. °· Imaging tests. °TREATMENT  °Your treatment depends on what is causing your chest pain. Treatment may include: °· Medicines. These may include: °¨ Acid blockers for heartburn. °¨ Anti-inflammatory medicine. °¨ Pain medicine for inflammatory conditions. °¨ Antibiotic medicine, if an infection is present. °¨ Medicines to dissolve blood clots. °¨ Medicines to treat coronary artery disease. °· Supportive care for conditions that do not require medicines. This may include: °¨ Resting. °¨ Applying heat   or cold packs to injured areas. °¨ Limiting activities until pain decreases. °HOME CARE INSTRUCTIONS °· If you were prescribed an antibiotic medicine, finish it all even if you start to feel better. °· Avoid any activities that bring on chest pain. °· Do not use any tobacco products, including  cigarettes, chewing tobacco, or electronic cigarettes. If you need help quitting, ask your health care provider. °· Do not drink alcohol. °· Take medicines only as directed by your health care provider. °· Keep all follow-up visits as directed by your health care provider. This is important. This includes any further testing if your chest pain does not go away. °· If heartburn is the cause for your chest pain, you may be told to keep your head raised (elevated) while sleeping. This reduces the chance that acid will go from your stomach into your esophagus. °· Make lifestyle changes as directed by your health care provider. These may include: °¨ Getting regular exercise. Ask your health care provider to suggest some activities that are safe for you. °¨ Eating a heart-healthy diet. A registered dietitian can help you to learn healthy eating options. °¨ Maintaining a healthy weight. °¨ Managing diabetes, if necessary. °¨ Reducing stress. °SEEK MEDICAL CARE IF: °· Your chest pain does not go away after treatment. °· You have a rash with blisters on your chest. °· You have a fever. °SEEK IMMEDIATE MEDICAL CARE IF:  °· Your chest pain is worse. °· You have an increasing cough, or you cough up blood. °· You have severe abdominal pain. °· You have severe weakness. °· You faint. °· You have chills. °· You have sudden, unexplained chest discomfort. °· You have sudden, unexplained discomfort in your arms, back, neck, or jaw. °· You have shortness of breath at any time. °· You suddenly start to sweat, or your skin gets clammy. °· You feel nauseous or you vomit. °· You suddenly feel light-headed or dizzy. °· Your heart begins to beat quickly, or it feels like it is skipping beats. °These symptoms may represent a serious problem that is an emergency. Do not wait to see if the symptoms will go away. Get medical help right away. Call your local emergency services (911 in the [place] Do not drive yourself to the hospital. °  °This  information is not intended to replace advice given to you by your health care provider. Make sure you discuss any questions you have with your health care provider. °  °Document Released: 12/31/2004 Document Revised: 04/13/2014 Document Reviewed: 10/27/2013 °Elsevier Interactive Patient Education ©2016 [employer] ° °

## 2015-10-02 NOTE — ED Notes (Addendum)
Lab sts "we have never received the blood work." MD aware. Blood recollected. Pt updated,

## 2015-10-02 NOTE — ED Notes (Signed)
Per EMS- pt was working on car in heat since 0830. Pt started having sharp CP and reports getting hot and dizzy. Pt then sat down in the shade and pain subsided. Pt had MI 2 months ago. Pt denies pain at this time. Pt also denies SOB, n/v.

## 2015-10-02 NOTE — ED Notes (Signed)
Pt did not receive ASA due to a gastric ulcer.

## 2015-10-02 NOTE — ED Notes (Signed)
Pt given "happy meal" bag with drink.

## 2015-10-02 NOTE — ED Notes (Signed)
Pt refusing troponin

## 2015-10-02 NOTE — ED Notes (Signed)
Pt ambulates independently and with steady gait at time of discharge. Discharge instructions and follow up information reviewed with patient. No other questions or concerns voiced at this time.  

## 2015-10-02 NOTE — ED Provider Notes (Signed)
CSN: [id number]     Arrival date & time 10/02/15  1120 History   First MD Initiated Contact with Patient 10/02/15 1127     Chief Complaint  Patient presents with  . Chest Pain    Patient is a 55 y.o. male presenting with chest pain. The history is provided by the patient. No language interpreter was used.  Chest Pain  Antonio Ramos is a 55 y.o. male who presents to the Emergency Department complaining of chest pain.  He presents for evaluation of chest pain that started about an hour ago.  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  He had associated dizziness, feeling hot.  No diaphoresis.  Pain was sharp and located in the right chest, lasted for 6-7 minutes.  Denies SOB, leg swelling or pain, abdominal pain, N/V/D.  He had similar sxs a month and a half ago but no cause was identified.  Pain is resolved on ED arrival.  His last dose of BP meds was last night, he forgot this morning's dose.     Past Medical History  Diagnosis Date  . Hypertension   . PUD (peptic ulcer disease)     Diagnosed in 1986, with 3 prior bleeds since that time, most recently in 2004  . Hemorrhagic shock 11/23/2012  . MI (myocardial infarction) (HCC)     per patient report, unconfimred  . Blood transfusion without reported diagnosis    Past Surgical History  Procedure Laterality Date  . Upper gi endoscopy    . Esophagogastroduodenoscopy N/A 11/23/2012    Procedure: ESOPHAGOGASTRODUODENOSCOPY (EGD);  Surgeon: Petra Kuba, MD;  Location: [facility] ENDOSCOPY;  Service: Endoscopy;  Laterality: N/A;   Family History  Problem Relation Age of Onset  . Heart attack Father     at age of 29   Social History  Substance Use Topics  . Smoking status: Former Smoker -- 0.20 packs/day for 24 years    Types: Cigarettes    Quit date: 03/17/2014  . Smokeless tobacco: None  . Alcohol Use: No     Comment: states used to drink heavily in 1983    Review of Systems  Cardiovascular: Positive for chest pain.  All other systems  reviewed and are negative.     Allergies  Review of patient's allergies indicates no known allergies.  Home Medications   Prior to Admission medications   Medication Sig Start Date End Date Taking? Authorizing Provider  amLODipine (NORVASC) 5 MG tablet Take 1 tablet (5 mg total) by mouth daily. 08/01/15 07/31/16 Yes Denton Brick, MD  diclofenac sodium (VOLTAREN) 1 % GEL Apply 2 g topically 4 (four) times daily. 06/28/15  Yes Tasrif Ahmed, MD  HYDROcodone-acetaminophen (NORCO/VICODIN) 5-325 MG tablet Take 1 tablet by mouth every 12 (twelve) hours as needed for moderate pain. 08/01/15  Yes Denton Brick, MD  lansoprazole (PREVACID) 30 MG capsule Take 1 capsule (30 mg total) by mouth daily at 12 noon. 04/14/15  Yes Loren Racer, MD  lisinopril-hydrochlorothiazide (PRINZIDE,ZESTORETIC) 20-25 MG per tablet Take 1 tablet by mouth daily. 08/16/14  Yes Gust Rung, DO  albuterol (PROVENTIL HFA;VENTOLIN HFA) 108 (90 BASE) MCG/ACT inhaler Inhale 1-2 puffs into the lungs every 6 (six) hours as needed for wheezing or shortness of breath. Patient not taking: Reported on 12/17/2014 02/04/14   Blane Ohara, MD   BP 151/105 mmHg  Pulse 63  Temp(Src) 98.1 F (36.7 C) (Oral)  Resp 17  Ht  (1.88 m)  Wt 205 lb (  92.987 kg)  BMI 26.31 kg/m2  SpO2 97% Physical Exam  Constitutional: He is oriented to person, place, and time. He appears well-developed and well-nourished.  HENT:  Head: Normocephalic and atraumatic.  Cardiovascular: Normal rate and regular rhythm.   No murmur heard. Pulmonary/Chest: Effort normal and breath sounds normal. No respiratory distress. He exhibits no tenderness.  Abdominal: Soft. There is no tenderness. There is no rebound and no guarding.  Musculoskeletal: He exhibits no edema or tenderness.  Neurological: He is alert and oriented to person, place, and time.  Skin: Skin is warm and dry.  Psychiatric: He has a normal mood and affect. His behavior is normal.  Nursing  note and vitals reviewed.   ED Course  Procedures (including critical care time) Labs Review Labs Reviewed  COMPREHENSIVE METABOLIC PANEL  CBC WITH DIFFERENTIAL/PLATELET  TROPONIN I  TROPONIN I    Imaging Review Dg Chest 2 View  10/02/2015  CLINICAL DATA:  Right side chest pain, dizziness EXAM: CHEST  2 VIEW COMPARISON:  04/14/2015 FINDINGS: Heart and mediastinal contours are within normal limits. No focal opacities or effusions. No acute bony abnormality. IMPRESSION: No active cardiopulmonary disease. Electronically Signed   By: Charlett Nose M.D.   On: 10/02/2015 11:58   I have personally reviewed and evaluated these images and lab results as part of my medical decision-making.   EKG Interpretation   Date/Time:  Wednesday October 02 2015 11:24:04 EDT Ventricular Rate:  70 PR Interval:    QRS Duration: 80 QT Interval:  372 QTC Calculation: 402 R Axis:   67 Text Interpretation:  Sinus rhythm Consider left ventricular hypertrophy  Abnrm T, consider ischemia, anterolateral lds Anterior ST elevation,  probably due to LVH similar when compared to prior on  04/14/15 Confirmed by  Antonio Ramos [phone]) on 10/02/2015 11:27:25 AM      MDM   Final diagnoses:  Chest pain, unspecified chest pain type    Patient here for evaluation of reported episode of chest pain prior to ED arrival. He has been pain-free in the department. EKG demonstrates ST elevation that is consistent with an LVH strain pattern that is similar to his prior EKGs. Recommendation for repeat troponin in the department as his initial troponin was drawn before 4 hours of onset of symptoms. Patient refuses repeat lab draw for any additional evaluation. Discussed with patient close outpatient follow-up, continue home medications, return precautions.    Tilden Fossa, MD 10/02/15 [phone]

## 2015-12-25 ENCOUNTER — Other Ambulatory Visit: Payer: Self-pay | Admitting: *Deleted

## 2015-12-25 MED ORDER — LISINOPRIL-HYDROCHLOROTHIAZIDE 20-25 MG PO TABS: 1.0000 | ORAL_TABLET | Freq: Every day | ORAL | 11 refills | Status: DC

## 2015-12-25 NOTE — Telephone Encounter (Signed)
No upcoming appts scheduled-will route to front office for next avail with pcp.Criss Alvine, Darlene Cassady9/20/20178:36 AM

## 2016-01-29 ENCOUNTER — Telehealth: Payer: Self-pay | Admitting: Internal Medicine

## 2016-01-29 NOTE — Telephone Encounter (Signed)
APT. REMINDER CALL, LMTCB °

## 2016-01-30 ENCOUNTER — Ambulatory Visit: Payer: BLUE CROSS/BLUE SHIELD | Admitting: Internal Medicine

## 2016-01-30 ENCOUNTER — Encounter: Payer: Self-pay | Admitting: Internal Medicine

## 2016-04-08 ENCOUNTER — Telehealth: Payer: Self-pay | Admitting: Internal Medicine

## 2016-04-08 NOTE — Telephone Encounter (Signed)
APT. REMINDER CALL, LMTCB °

## 2016-04-09 ENCOUNTER — Ambulatory Visit (INDEPENDENT_AMBULATORY_CARE_PROVIDER_SITE_OTHER): Payer: BLUE CROSS/BLUE SHIELD | Admitting: Internal Medicine

## 2016-04-09 ENCOUNTER — Encounter: Payer: Self-pay | Admitting: Internal Medicine

## 2016-04-09 DIAGNOSIS — Z79899 Other long term (current) drug therapy: Secondary | ICD-10-CM

## 2016-04-09 DIAGNOSIS — G8929 Other chronic pain: Secondary | ICD-10-CM | POA: Diagnosis not present

## 2016-04-09 DIAGNOSIS — Z23 Encounter for immunization: Secondary | ICD-10-CM

## 2016-04-09 DIAGNOSIS — Z8711 Personal history of peptic ulcer disease: Secondary | ICD-10-CM

## 2016-04-09 DIAGNOSIS — Z87891 Personal history of nicotine dependence: Secondary | ICD-10-CM

## 2016-04-09 DIAGNOSIS — I10 Essential (primary) hypertension: Secondary | ICD-10-CM

## 2016-04-09 DIAGNOSIS — M25511 Pain in right shoulder: Secondary | ICD-10-CM

## 2016-04-09 MED ORDER — DICLOFENAC SODIUM 1 % TD GEL: 2.0000 g | Freq: Four times a day (QID) | TRANSDERMAL | 1 refills | Status: DC

## 2016-04-09 MED ORDER — AMLODIPINE BESYLATE 5 MG PO TABS: 5.0000 mg | ORAL_TABLET | Freq: Every day | ORAL | 3 refills | Status: DC

## 2016-04-09 MED ORDER — LISINOPRIL-HYDROCHLOROTHIAZIDE 20-25 MG PO TABS: 1.0000 | ORAL_TABLET | Freq: Every day | ORAL | 3 refills | Status: DC

## 2016-04-09 NOTE — Patient Instructions (Signed)
I am starting you on a new blood pressure pill called amlodipine.  I want you to take a 5mg  pill once a day.  I want you to continue taking your other blood pressure pill.

## 2016-04-09 NOTE — Progress Notes (Signed)
 [facility] Subjective:  HPI: Antonio Ramos is a 56 y.o. male who presents for follow up of HTN.  Please see problem based charting below for the status of his chronic medical problems.     Review of Systems: Review of Systems  Constitutional: Negative for fever.  Respiratory: Negative for cough.   Gastrointestinal: Negative for abdominal pain, blood in stool, heartburn and nausea.  Musculoskeletal: Positive for joint pain.    Objective:  Physical Exam: Vitals:   04/09/16 0822 04/09/16 0847  BP: (!) 161/102 (!) 157/92  Pulse: 92 85  Temp: 98.3 F (36.8 C)   TempSrc: Oral   SpO2: 100%   Weight: 200 lb 4.8 oz (90.9 kg)   Height: 6\' 2"  (1.88 m)    Physical Exam  Constitutional: He is well-developed, well-nourished, and in no distress.  Cardiovascular: Normal rate and regular rhythm.   Pulmonary/Chest: Effort normal and breath sounds normal.  Abdominal: Soft. Bowel sounds are normal.  Musculoskeletal: He exhibits no edema.       Right shoulder: He exhibits no swelling, no effusion, no crepitus and normal strength.  Painful arc 90-120 of right shoulder  Skin: Skin is warm.  Nursing note and vitals reviewed.  Assessment & Plan:  Right shoulder pain HPI: Still having some chronic right shoulder pain, pain is worse at night when laying on right side.  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  Xrays back in 2016 showed mild subacromial space narrowing and spurring otherwise normal.  He reports that norco helped as well as Voltaren gel.  A:  Chronic right shoulder pain  P: Continue Voltaren gel  History of peptic ulcer disease HPI:  He apparently is no longer taking PPI, he does not want to take the medication anymore since he is doing well.  By report he was treated for H Pylori back in [place] many years ago and was treated again here with quadruple therapy here in 2014.  A: Hx PUD  P: Will monitor.  Essential hypertension HPI:  Patient asymptomatic.  Reports he is taking his BP pills, only side effect is frequent urination so he takes it after work. Of note the pill bottle he brings is from September and has 1 pill remaining.  I asked him about this but he reports that he is taking it every day.  Of note he does not have amlodipine and has not taken this.  A: Essential HTN, not at goal  P: Continue Lisinopril-HCTZ 20-25mg  daily. Once again Start Amlodipine 5mg  daily.   Medications Ordered Meds ordered this encounter  Medications  . lisinopril-hydrochlorothiazide (PRINZIDE,ZESTORETIC) 20-25 MG tablet    Sig: Take 1 tablet by mouth daily.    Dispense:  90 tablet    Refill:  3  . amLODipine (NORVASC) 5 MG tablet    Sig: Take 1 tablet (5 mg total) by mouth daily.    Dispense:  90 tablet    Refill:  3  . diclofenac sodium (VOLTAREN) 1 % GEL    Sig: Apply 2 g topically 4 (four) times daily.    Dispense:  100 g    Refill:  1   Other Orders No orders of the defined types were placed in this encounter.  Follow Up: Return in about 3 months (around 07/08/2016).

## 2016-04-09 NOTE — Assessment & Plan Note (Signed)
HPI:  He apparently is no longer taking PPI, he does not want to take the medication anymore since he is doing well.  By report he was treated for H Pylori back in [place] many years ago and was treated again here with quadruple therapy here in 2014.  A: Hx PUD  P: Will monitor.

## 2016-04-09 NOTE — Assessment & Plan Note (Signed)
HPI: Still having some chronic right shoulder pain, pain is worse at night when laying on right side.  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  Xrays back in 2016 showed mild subacromial space narrowing and spurring otherwise normal.  He reports that norco helped as well as Voltaren gel.  A:  Chronic right shoulder pain  P: Continue Voltaren gel

## 2016-04-09 NOTE — Assessment & Plan Note (Signed)
HPI: Patient asymptomatic.  Reports he is taking his BP pills, only side effect is frequent urination so he takes it after work. Of note the pill bottle he brings is from September and has 1 pill remaining.  I asked him about this but he reports that he is taking it every day.  Of note he does not have amlodipine and has not taken this.  A: Essential HTN, not at goal  P: Continue Lisinopril-HCTZ 20-25mg  daily. Once again Start Amlodipine 5mg  daily.

## 2016-07-14 ENCOUNTER — Encounter (INDEPENDENT_AMBULATORY_CARE_PROVIDER_SITE_OTHER): Payer: Self-pay

## 2016-07-14 ENCOUNTER — Ambulatory Visit (INDEPENDENT_AMBULATORY_CARE_PROVIDER_SITE_OTHER): Payer: BLUE CROSS/BLUE SHIELD | Admitting: Internal Medicine

## 2016-07-14 DIAGNOSIS — M7541 Impingement syndrome of right shoulder: Secondary | ICD-10-CM

## 2016-07-14 DIAGNOSIS — Z79899 Other long term (current) drug therapy: Secondary | ICD-10-CM | POA: Diagnosis not present

## 2016-07-14 DIAGNOSIS — Z9114 Patient's other noncompliance with medication regimen: Secondary | ICD-10-CM

## 2016-07-14 DIAGNOSIS — Z87891 Personal history of nicotine dependence: Secondary | ICD-10-CM

## 2016-07-14 DIAGNOSIS — I10 Essential (primary) hypertension: Secondary | ICD-10-CM | POA: Diagnosis not present
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
## 2016-07-14 NOTE — Patient Instructions (Signed)
General Instructions: - You received a shoulder injection today - Please record your blood pressures daily until you see Dr. Mikey Bussing - Remember to take your blood pressure medications every day!  Please bring your medicines with you each time you come to clinic.  Medicines may include prescription medications, over-the-counter medications, herbal remedies, eye drops, vitamins, or other pills.   Progress Toward Treatment Goals:  Treatment Goal 08/16/2014  Blood pressure deteriorated  Stop smoking -    Self Care Goals & Plans:  Self Care Goal 06/28/2015  Manage my medications bring my medications to every visit; refill my medications on time; take my medicines as prescribed  Monitor my health keep track of my blood pressure  Eat healthy foods eat more vegetables; eat foods that are low in salt; eat baked foods instead of fried foods  Be physically active find an activity I enjoy  Stop smoking -    No flowsheet data found.   Care Management & Community Referrals:  Referral 08/16/2014  Referrals made for care management support none needed

## 2016-07-14 NOTE — Assessment & Plan Note (Signed)
BP elevated today. Patient admitted to intermittent non-compliance with his medications. He was given a BP log and recommended to record his BPs before following up with Dr. Mikey Bussing on 5/17. Will continue current regimen of Amlodipine 5 mg daily and Lisinopril-HCTZ 20-25 mg daily.

## 2016-07-14 NOTE — Assessment & Plan Note (Signed)
Patient received a steroid injection today for his right shoulder pain. Hopefully this will provide some relief for the next few months. Patient to follow up with Dr. Mikey Bussing on 5/17.

## 2016-07-14 NOTE — Progress Notes (Deleted)
[facility] Subjective:  HPI: Antonio Ramos is a 55 y.o. male who presents for ***     Review of Systems: ROS  Objective:  Physical Exam: Vitals:   07/14/16 1329  BP: (!) 152/96  Pulse: 85  Temp: 97.8 F (36.6 C)  TempSrc: Oral  SpO2: 99%  Weight: 202 lb 12.8 oz (92 kg)  Height: 6\' 2"  (1.88 m)   Physical Exam Assessment & Plan:  No problem-specific Assessment & Plan notes found for this encounter.   Medications Ordered No orders of the defined types were placed in this encounter.  Other Orders No orders of the defined types were placed in this encounter.  Follow Up: No Follow-up on file.

## 2016-07-15 NOTE — Progress Notes (Addendum)
Internal Medicine Clinic Attending  I saw and evaluated the patient.  I personally confirmed the key portions of the history and exam documented by Dr. Beckie Salts and I reviewed pertinent patient test results.  The assessment, diagnosis, and plan were formulated together and I agree with the documentation in the resident's note. I was present for the entire procedure. For his BP he admitted missing multiple doses and just resumed BP meds this morning.  We discussed low salt diet and compliance with medications.  We will be rechecking in 1 month.  For his shoulder on exam he does have full aROM of his right shoulder however a painful arc.  Neers and hawkins test are positive on the right shoulder.  He reports that voltaren was working however now is less effective.  We will give steroid injection another try. If that does not provide relief will need referral to ortho.  For the procedure: to clarify 0.5cc of kenalog was used (20mg )

## 2016-07-21 ENCOUNTER — Emergency Department (HOSPITAL_COMMUNITY)
Admission: EM | Admit: 2016-07-21 | Discharge: 2016-07-21 | Discharge status: Against Medical Advice | Payer: BLUE CROSS/BLUE SHIELD | Attending: Emergency Medicine | Admitting: Emergency Medicine

## 2016-07-21 ENCOUNTER — Emergency Department (HOSPITAL_COMMUNITY): Payer: BLUE CROSS/BLUE SHIELD

## 2016-07-21 ENCOUNTER — Encounter (HOSPITAL_COMMUNITY): Payer: Self-pay | Admitting: Emergency Medicine

## 2016-07-21 DIAGNOSIS — Y939 Activity, unspecified: Secondary | ICD-10-CM | POA: Insufficient documentation

## 2016-07-21 DIAGNOSIS — Y990 Civilian activity done for income or pay: Secondary | ICD-10-CM | POA: Diagnosis not present

## 2016-07-21 DIAGNOSIS — Z79899 Other long term (current) drug therapy: Secondary | ICD-10-CM | POA: Diagnosis not present

## 2016-07-21 DIAGNOSIS — I10 Essential (primary) hypertension: Secondary | ICD-10-CM | POA: Insufficient documentation

## 2016-07-21 DIAGNOSIS — S61439A Puncture wound without foreign body of unspecified hand, initial encounter: Secondary | ICD-10-CM

## 2016-07-21 DIAGNOSIS — S6991XA Unspecified injury of right wrist, hand and finger(s), initial encounter: Secondary | ICD-10-CM | POA: Diagnosis present

## 2016-07-21 DIAGNOSIS — I252 Old myocardial infarction: Secondary | ICD-10-CM | POA: Insufficient documentation

## 2016-07-21 DIAGNOSIS — S60221A Contusion of right hand, initial encounter: Secondary | ICD-10-CM | POA: Diagnosis not present

## 2016-07-21 DIAGNOSIS — S61431A Puncture wound without foreign body of right hand, initial encounter: Secondary | ICD-10-CM | POA: Insufficient documentation

## 2016-07-21 DIAGNOSIS — Z87891 Personal history of nicotine dependence: Secondary | ICD-10-CM | POA: Diagnosis not present

## 2016-07-21 DIAGNOSIS — Y9289 Other specified places as the place of occurrence of the external cause: Secondary | ICD-10-CM | POA: Diagnosis not present

## 2016-07-21 DIAGNOSIS — X58XXXA Exposure to other specified factors, initial encounter: Secondary | ICD-10-CM | POA: Diagnosis not present

## 2016-07-21 LAB — CBC WITH DIFFERENTIAL/PLATELET
BASOS ABS: 0 10*3/uL (ref 0.0–0.1)
BASOS PCT: 1 %
EOS ABS: 0.1 10*3/uL (ref 0.0–0.7)
EOS PCT: 1 %
HCT: 40.6 % (ref 39.0–52.0)
Hemoglobin: 13.8 g/dL (ref 13.0–17.0)
LYMPHS PCT: 24 %
Lymphs Abs: 1.9 10*3/uL (ref 0.7–4.0)
MCH: 27.9 pg (ref 26.0–34.0)
MCHC: 34 g/dL (ref 30.0–36.0)
MCV: 82.2 fL (ref 78.0–100.0)
MONO ABS: 0.3 10*3/uL (ref 0.1–1.0)
Monocytes Relative: 4 %
Neutro Abs: 5.6 10*3/uL (ref 1.7–7.7)
Neutrophils Relative %: 70 %
PLATELETS: 223 10*3/uL (ref 150–400)
RBC: 4.94 MIL/uL (ref 4.22–5.81)
RDW: 15 % (ref 11.5–15.5)
WBC: 8 10*3/uL (ref 4.0–10.5)

## 2016-07-21 LAB — BASIC METABOLIC PANEL
ANION GAP: 13 (ref 5–15)
BUN: 11 mg/dL (ref 6–20)
CALCIUM: 9.7 mg/dL (ref 8.9–10.3)
CO2: 26 mmol/L (ref 22–32)
CREATININE: 1.01 mg/dL (ref 0.61–1.24)
Chloride: 99 mmol/L — ABNORMAL LOW (ref 101–111)
GLUCOSE: 88 mg/dL (ref 65–99)
POTASSIUM: 3.3 mmol/L — AB (ref 3.5–5.1)
SODIUM: 138 mmol/L (ref 135–145)

## 2016-07-21 MED ORDER — STERILE WATER FOR INJECTION IJ SOLN
INTRAMUSCULAR | Status: AC
  Filled 2016-07-21: qty 10

## 2016-07-21 MED ORDER — OXYCODONE-ACETAMINOPHEN 5-325 MG PO TABS
2.0000 | ORAL_TABLET | Freq: Once | ORAL | Status: AC
  Administered 2016-07-21: 2 via ORAL
  Filled 2016-07-21: qty 2

## 2016-07-21 MED ORDER — CEFAZOLIN SODIUM 1 G IJ SOLR
2.0000 g | Freq: Once | INTRAMUSCULAR | Status: DC
  Filled 2016-07-21: qty 20

## 2016-07-21 MED ORDER — MORPHINE SULFATE (PF) 4 MG/ML IV SOLN
4.0000 mg | Freq: Once | INTRAVENOUS | Status: DC
  Filled 2016-07-21: qty 1
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
## 2016-07-21 NOTE — ED Notes (Signed)
Patient transported to X-ray 
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
## 2016-07-21 NOTE — ED Notes (Addendum)
Went to give patient antibiotics, pt states "he is leaving no matter what" he was instructed that this is against medical advice and I alerted the hand surgeon. Pt was alerted of risks and said he did not want any more medication or medical attention. Patient signed AMA. Pt stable and ambulatory

## 2016-08-20 ENCOUNTER — Encounter: Payer: Self-pay | Admitting: Internal Medicine

## 2016-08-20 ENCOUNTER — Ambulatory Visit (INDEPENDENT_AMBULATORY_CARE_PROVIDER_SITE_OTHER): Payer: BLUE CROSS/BLUE SHIELD | Admitting: Internal Medicine

## 2016-08-20 VITALS — BP 138/96 | HR 66 | Temp 97.9°F | Ht 74.0 in | Wt 201.4 lb

## 2016-08-20 DIAGNOSIS — Z87891 Personal history of nicotine dependence: Secondary | ICD-10-CM | POA: Diagnosis not present

## 2016-08-20 DIAGNOSIS — M7541 Impingement syndrome of right shoulder: Secondary | ICD-10-CM | POA: Diagnosis not present

## 2016-08-20 DIAGNOSIS — I10 Essential (primary) hypertension: Secondary | ICD-10-CM | POA: Diagnosis not present

## 2016-08-20 NOTE — Patient Instructions (Signed)
I want you to start using Mrs Sharilyn Sites instead of table salt.
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
## 2016-08-21 NOTE — Assessment & Plan Note (Signed)
History of present illness: Blood pressure is been above goal last 2 visits. He does report he is taking his lisinopril-hydrochlorothiazide as well as amlodipine. He notes previously we had increased the doses he was feeling dizzy upon standing and unwell at work. He does report that he ate a meal heavy with salt and usually add salt to all of his meals.  Assessment essential hypertension not at goal  Plan I discussed with him the importance of getting blood pressure control for heart attack and stroke prevention. He is resistant to me going up on the pressure medications again for fear of repeat orthostatic hypotension. I told him we can try to avoid this however he will have to substitute Mrs.Dash and lower his salt intake. I'm also giving him blood pressure log for him to check his blood pressure at work.
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
## 2017-01-05 DIAGNOSIS — M75101 Unspecified rotator cuff tear or rupture of right shoulder, not specified as traumatic: Secondary | ICD-10-CM | POA: Insufficient documentation

## 2017-01-05 NOTE — Progress Notes (Signed)
[facility] Subjective:  HPI: Antonio Ramos is a 56 y.o. 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  Please see Assessment and Plan below for the status of his chronic medical problems.  Review of Systems: Review of Systems  Constitutional: Negative for fever.  Eyes: Negative for blurred vision.  Respiratory: Negative for cough.   Musculoskeletal: Negative for joint pain and myalgias.  Neurological: Negative for dizziness, sensory change and headaches.    Objective:  Physical Exam: Vitals:   01/07/17 0905 01/07/17 0950  BP: (!) 161/101 (!) 148/94  Pulse: 73   Temp: 98.1 F (36.7 C)   TempSrc: Oral   SpO2: 100%   Weight: 206 lb 1.6 oz (93.5 kg)   Height: 6\' 2"  (1.88 m)   Physical Exam  Constitutional: He is well-developed, well-nourished, and in no distress.  Cardiovascular: Normal rate, regular rhythm and normal heart sounds.   Pulmonary/Chest: Effort normal and breath sounds normal.  Musculoskeletal:       Right shoulder: He exhibits normal range of motion.       Left shoulder: He exhibits normal range of motion.  Nursing note and vitals reviewed.  Assessment & Plan:  Essential hypertension HPI: Reports he has been checking his BP, didn't bring log but thinks mostly numbers are in 120s-130s range.  He notes that he has only been taking lisinopril- HCTZ but not amlodipine.  He denies any HA, changes in vision.  A: Essential HTN not at goal  P:Continue lisinopril-HCTZ 20-25 daily Resume taking amlodipine 5mg  daily  Right rotator cuff tear HPI: since our last visit he reports he saw an orthopedist, cannot remember his name.  In review of care everywhere I was able to locate an MRI from May which  revealed a full thickness tear of supraspinatus muscle.  He reports that the doctor was considering surgery but ultimately it "got better" and he did not need surgery.  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  P: Asked him to obtain physican name/ records request.  Otherwise he does not current seem to be borthered by his shoulder.   Medications Ordered No orders of the defined types were placed in this encounter.  Other Orders No orders of the defined types were placed in this encounter.  Follow Up: Return in about 3 months (around 04/09/2017).

## 2017-01-07 ENCOUNTER — Ambulatory Visit (INDEPENDENT_AMBULATORY_CARE_PROVIDER_SITE_OTHER): Payer: No Typology Code available for payment source | Admitting: Internal Medicine

## 2017-01-07 ENCOUNTER — Encounter (INDEPENDENT_AMBULATORY_CARE_PROVIDER_SITE_OTHER): Payer: Self-pay

## 2017-01-07 ENCOUNTER — Encounter: Payer: Self-pay | Admitting: Internal Medicine

## 2017-01-07 VITALS — BP 148/94 | HR 73 | Temp 98.1°F | Ht 74.0 in | Wt 206.1 lb

## 2017-01-07 DIAGNOSIS — I10 Essential (primary) hypertension: Secondary | ICD-10-CM

## 2017-01-07 DIAGNOSIS — M75101 Unspecified rotator cuff tear or rupture of right shoulder, not specified as traumatic: Secondary | ICD-10-CM

## 2017-01-07 DIAGNOSIS — M7541 Impingement syndrome of right shoulder: Secondary | ICD-10-CM | POA: Diagnosis not present

## 2017-01-07 NOTE — Assessment & Plan Note (Addendum)
HPI: since our last visit he reports he saw an orthopedist, cannot remember his name.  In review of care everywhere I was able to locate an MRI from May which revealed a full thickness tear of supraspinatus muscle.  He reports that the doctor was considering surgery but ultimately it "got better" and he did not need surgery.  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  P: Asked him to obtain physican name/ records request.  Otherwise he does not current seem to be borthered by his shoulder.

## 2017-01-07 NOTE — Assessment & Plan Note (Signed)
HPI: Reports he has been checking his BP, didn't bring log but thinks mostly numbers are in 120s-130s range.  He notes that he has only been taking lisinopril- HCTZ but not amlodipine.  He denies any HA, changes in vision.  A: Essential HTN not at goal  P:Continue lisinopril-HCTZ 20-25 daily Resume taking amlodipine 5mg  daily

## 2017-01-07 NOTE — Patient Instructions (Signed)
I want you to start back taking the amlodipine.

## 2017-04-08 ENCOUNTER — Encounter: Payer: No Typology Code available for payment source | Admitting: Internal Medicine

## 2017-04-28 NOTE — Progress Notes (Signed)
  Subjective:  HPI: Antonio Ramos is a 57 y.o. male who presents for follow up of HTN  Please see Assessment and Plan below for the status of his chronic medical problems.  Review of Systems: Review of Systems  Respiratory: Negative for shortness of breath.   Cardiovascular: Negative for chest pain.  Musculoskeletal: Negative for joint pain and myalgias.  Neurological: Negative for headaches.    Objective:  Physical Exam: Vitals:   04/29/17 0927  BP: (!) 162/106  Pulse: 73  Temp: 97.9 F (36.6 C)  TempSrc: Oral  SpO2: 99%  Weight: 208 lb 4.8 oz (94.5 kg)  Height: 6\' 2"  (1.88 m)   Physical Exam  Constitutional: He is well-developed, well-nourished, and in no distress. No distress.  HENT:  Head: Normocephalic and atraumatic.  Eyes: Conjunctivae are normal.  Cardiovascular: Normal rate, regular rhythm, normal heart sounds and intact distal pulses.  No murmur heard. Pulmonary/Chest: Effort normal and breath sounds normal. No respiratory distress. He has no wheezes. He has no rales.  Abdominal: Soft. Bowel sounds are normal. He exhibits no distension. There is no tenderness.  Musculoskeletal: He exhibits no edema.  Skin: Skin is warm and dry. He is not diaphoretic.  Psychiatric: Affect and judgment normal.  Nursing note and vitals reviewed.  Assessment & Plan:  Essential hypertension HPI: No complaints, brought meds, only has amlodipine with him, reports he thought he was out of refills for the other BP med/ ? If he needed to take both BP meds.  A: Essential HTN not at goal  P: Continue amlodipine 5mg  daily,  Lisinopril-HCTZ 20-25mg  daily. Discussed importance of taking both medications daily. Patient agreeable.  F/u in 1 month.  Tobacco abuse -Cutting down, does not want any medication assistance.  Urinary frequency HPI: reports some increase urinary frequency without dysuria. Reports about 3 months, has not been taking lisinopril-hctz during this time. Drinks  plenty of water.  Gets up 0-1 times during the night to urinate.  No hesitancy or dribbling.  A: Urinary frequency  P:Check U/A may be function due to water consumption.   Medications Ordered Meds ordered this encounter  Medications  . lisinopril-hydrochlorothiazide (PRINZIDE,ZESTORETIC) 20-25 MG tablet    Sig: Take 1 tablet by mouth daily.    Dispense:  90 tablet    Refill:  3  . amLODipine (NORVASC) 5 MG tablet    Sig: Take 1 tablet (5 mg total) by mouth daily.    Dispense:  90 tablet    Refill:  3   Other Orders Orders Placed This Encounter  Procedures  . Flu Vaccine QUAD 36+ mos IM  . Urinalysis, Reflex Microscopic  . BMP8+Anion Gap  . Hepatitis C antibody  . HIV antibody (with reflex)   Follow Up: Return in about 2 months (around 06/27/2017).

## 2017-04-29 ENCOUNTER — Ambulatory Visit: Payer: No Typology Code available for payment source | Admitting: Internal Medicine

## 2017-04-29 ENCOUNTER — Encounter: Payer: Self-pay | Admitting: Internal Medicine

## 2017-04-29 ENCOUNTER — Other Ambulatory Visit: Payer: Self-pay

## 2017-04-29 VITALS — BP 162/106 | HR 73 | Temp 97.9°F | Ht 74.0 in | Wt 208.3 lb

## 2017-04-29 DIAGNOSIS — F17210 Nicotine dependence, cigarettes, uncomplicated: Secondary | ICD-10-CM | POA: Diagnosis not present

## 2017-04-29 DIAGNOSIS — Z9189 Other specified personal risk factors, not elsewhere classified: Secondary | ICD-10-CM

## 2017-04-29 DIAGNOSIS — R350 Frequency of micturition: Secondary | ICD-10-CM

## 2017-04-29 DIAGNOSIS — I10 Essential (primary) hypertension: Secondary | ICD-10-CM | POA: Diagnosis not present

## 2017-04-29 DIAGNOSIS — Z114 Encounter for screening for human immunodeficiency virus [HIV]: Secondary | ICD-10-CM

## 2017-04-29 DIAGNOSIS — Z79899 Other long term (current) drug therapy: Secondary | ICD-10-CM | POA: Diagnosis not present

## 2017-04-29 DIAGNOSIS — Z23 Encounter for immunization: Secondary | ICD-10-CM | POA: Diagnosis not present

## 2017-04-29 DIAGNOSIS — Z1159 Encounter for screening for other viral diseases: Secondary | ICD-10-CM

## 2017-04-29 DIAGNOSIS — Z720 Tobacco use: Secondary | ICD-10-CM

## 2017-04-29 MED ORDER — LISINOPRIL-HYDROCHLOROTHIAZIDE 20-25 MG PO TABS: 1.0000 | ORAL_TABLET | Freq: Every day | ORAL | 3 refills | Status: DC

## 2017-04-29 MED ORDER — AMLODIPINE BESYLATE 5 MG PO TABS: 5.0000 mg | ORAL_TABLET | Freq: Every day | ORAL | 3 refills | Status: DC

## 2017-04-29 NOTE — Patient Instructions (Signed)
Please start taking your blood pressure medications.

## 2017-04-30 DIAGNOSIS — R350 Frequency of micturition: Secondary | ICD-10-CM | POA: Insufficient documentation

## 2017-04-30 LAB — BMP8+ANION GAP
Anion Gap: 14 mmol/L (ref 10.0–18.0)
BUN/Creatinine Ratio: 11 (ref 9–20)
BUN: 12 mg/dL (ref 6–24)
CO2: 25 mmol/L (ref 20–29)
Calcium: 9.6 mg/dL (ref 8.7–10.2)
Chloride: 100 mmol/L (ref 96–106)
Creatinine, Ser: 1.05 mg/dL (ref 0.76–1.27)
GFR, EST AFRICAN AMERICAN: 90 mL/min/{1.73_m2} (ref >59)
GFR, EST NON AFRICAN AMERICAN: 78 mL/min/{1.73_m2} (ref >59)
Glucose: 96 mg/dL (ref 65–99)
POTASSIUM: 4.9 mmol/L (ref 3.5–5.2)
SODIUM: 139 mmol/L (ref 134–144)

## 2017-04-30 LAB — HEPATITIS C ANTIBODY: Hep C Virus Ab: <0.1 s/co ratio (ref 0.0–0.9)

## 2017-04-30 LAB — HIV ANTIBODY (ROUTINE TESTING W REFLEX): HIV Screen 4th Generation wRfx: NONREACTIVE

## 2017-04-30 NOTE — Assessment & Plan Note (Signed)
-  Cutting down, does not want any medication assistance.

## 2017-04-30 NOTE — Assessment & Plan Note (Signed)
HPI: No complaints, brought meds, only has amlodipine with him, reports he thought he was out of refills for the other BP med/ ? If he needed to take both BP meds.  A: Essential HTN not at goal  P: Continue amlodipine 5mg  daily,  Lisinopril-HCTZ 20-25mg  daily. Discussed importance of taking both medications daily. Patient agreeable.  F/u in 1 month.

## 2017-04-30 NOTE — Assessment & Plan Note (Signed)
HPI: reports some increase urinary frequency without dysuria. Reports about 3 months, has not been taking lisinopril-hctz during this time. Drinks plenty of water.  Gets up 0-1 times during the night to urinate.  No hesitancy or dribbling.  A: Urinary frequency  P:Check U/A may be function due to water consumption.

## 2017-06-08 NOTE — Addendum Note (Signed)
Addended by: Remus Blake on: 06/08/2017 09:53 AM   Modules accepted: Orders

## 2017-08-17 ENCOUNTER — Other Ambulatory Visit: Payer: Self-pay

## 2017-08-17 ENCOUNTER — Encounter (HOSPITAL_COMMUNITY): Payer: Self-pay | Admitting: Emergency Medicine

## 2017-08-17 ENCOUNTER — Ambulatory Visit (HOSPITAL_COMMUNITY)
Admission: EM | Admit: 2017-08-17 | Discharge: 2017-08-17 | Disposition: A | Discharge status: Home / Self Care | Payer: BLUE CROSS/BLUE SHIELD | Attending: Family Medicine | Admitting: Family Medicine

## 2017-08-17 DIAGNOSIS — Z9889 Other specified postprocedural states: Secondary | ICD-10-CM | POA: Insufficient documentation

## 2017-08-17 DIAGNOSIS — Z8249 Family history of ischemic heart disease and other diseases of the circulatory system: Secondary | ICD-10-CM | POA: Insufficient documentation

## 2017-08-17 DIAGNOSIS — Z8711 Personal history of peptic ulcer disease: Secondary | ICD-10-CM | POA: Diagnosis not present

## 2017-08-17 DIAGNOSIS — Z79899 Other long term (current) drug therapy: Secondary | ICD-10-CM | POA: Insufficient documentation

## 2017-08-17 DIAGNOSIS — I10 Essential (primary) hypertension: Secondary | ICD-10-CM | POA: Diagnosis not present

## 2017-08-17 DIAGNOSIS — J029 Acute pharyngitis, unspecified: Secondary | ICD-10-CM

## 2017-08-17 DIAGNOSIS — Z87891 Personal history of nicotine dependence: Secondary | ICD-10-CM | POA: Diagnosis not present

## 2017-08-17 DIAGNOSIS — I252 Old myocardial infarction: Secondary | ICD-10-CM | POA: Diagnosis not present

## 2017-08-17 LAB — POCT RAPID STREP A: Streptococcus, Group A Screen (Direct): NEGATIVE

## 2017-08-17 MED ORDER — IBUPROFEN 800 MG PO TABS
ORAL_TABLET | ORAL | Status: AC
  Filled 2017-08-17: qty 1

## 2017-08-17 MED ORDER — AMOXICILLIN 500 MG PO CAPS: 500.0000 mg | ORAL_CAPSULE | Freq: Two times a day (BID) | ORAL | 0 refills | Status: AC

## 2017-08-17 MED ORDER — IBUPROFEN 800 MG PO TABS
800.0000 mg | ORAL_TABLET | Freq: Once | ORAL | Status: AC
  Administered 2017-08-17: 800 mg via ORAL

## 2017-08-17 NOTE — Discharge Instructions (Signed)
Your rapid strep was negative today. We have sent your throat swabs to be cultured and would call with any positive findings.  Due to findings on exam we will start antibiotics for this at this time. Complete course of antibiotics.  Push fluids to ensure adequate hydration and keep secretions thin.  Tylenol and/or ibuprofen as needed for pain or fevers.   If symptoms worsen or do not improve in the next week to return to be seen or to follow up with your PCP.

## 2017-08-17 NOTE — ED Triage Notes (Signed)
Pt reports a sore throat since Sunday night.

## 2017-08-17 NOTE — ED Provider Notes (Signed)
[facility]    CSN: [id number] Arrival date & time: 08/17/17  1609     History   Chief Complaint Chief Complaint  Patient presents with  . Sore Throat    HPI Antonio Ramos is a 57 y.o. male.   [name] presents with complaints of sore throat, worse to left throat, worse with swallowing. Denies cough, congestion, ear pain, rash, gi/gu complaints. Has been drinking fluids and soups. No known ill contacts. Feels fatigue. Chills today. Hx htn, PUD.   ROS per HPI.      Past Medical History:  Diagnosis Date  . Blood transfusion without reported diagnosis   . Hemorrhagic shock (HCC) 11/23/2012  . Hypertension   . MI (myocardial infarction) (HCC)    per patient report, unconfimred  . PUD (peptic ulcer disease)    Diagnosed in 1986, with 3 prior bleeds since that time, most recently in 2004    Patient Active Problem List   Diagnosis Date Noted  . Urinary frequency 04/30/2017  . Right rotator cuff tear 01/05/2017  . Shoulder impingement syndrome, right 12/17/2014  . 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 Risk for coronary artery disease between 10% and 20% in next 10 years per Framingham score 07/21/2013  . Tobacco abuse 02/02/2013  . Preventative health care 02/02/2013  . History of peptic ulcer disease 11/21/2012  . Essential hypertension 11/21/2012    Past Surgical History:  Procedure Laterality Date  . ESOPHAGOGASTRODUODENOSCOPY N/A 11/23/2012   Procedure: ESOPHAGOGASTRODUODENOSCOPY (EGD);  Surgeon: Petra Kuba, MD;  Location: [facility] ENDOSCOPY;  Service: Endoscopy;  Laterality: N/A;  . UPPER GI ENDOSCOPY         Home Medications    Prior to Admission medications   Medication Sig Start Date End Date Taking? Authorizing Provider  amLODipine (NORVASC) 5 MG tablet Take 1 tablet (5 mg total) by mouth daily. 04/29/17 04/29/18 Yes Gust Rung, DO  lisinopril-hydrochlorothiazide (PRINZIDE,ZESTORETIC) 20-25 MG tablet Take 1 tablet by mouth daily. 04/29/17  Yes Gust Rung, DO  albuterol (PROVENTIL HFA;VENTOLIN HFA) 108 (90 BASE) MCG/ACT inhaler Inhale 1-2 puffs into the lungs every 6 (six) hours as needed for wheezing or shortness of breath. Patient not taking: Reported on 12/17/2014 02/04/14   Blane Ohara, MD  amoxicillin (AMOXIL) 500 MG capsule Take 1 capsule (500 mg total) by mouth 2 (two) times daily for 10 days. 08/17/17 08/27/17  Linus Mako B, NP  diclofenac sodium (VOLTAREN) 1 % GEL Apply 2 g topically 4 (four) times daily. 04/09/16   Gust Rung, DO    Family History Family History  Problem Relation Age of Onset  . Heart attack Father        at age of 79    Social History Social History   Tobacco Use  . Smoking status: Former Smoker    Packs/day: 0.20    Years: 24.00    Pack years: 4.80    Types: Cigarettes    Last attempt to quit: 03/17/2014    Years since quitting: 3.4  . Smokeless tobacco: Never Used  Substance Use Topics  . Alcohol use: No    Alcohol/week: 0.6 oz    Types: 1 Standard drinks or equivalent per week    Comment: states used to drink heavily in 1983  . Drug use: No     Allergies   Patient has no known allergies.   Review of Systems Review of Systems   Physical Exam Triage Vital Signs ED Triage Vitals  Enc Vitals Group  BP 08/17/17 1649 (!) 160/112     Pulse Rate 08/17/17 1649 85     Resp --      Temp 08/17/17 1649 99.9 F (37.7 C)     Temp Source 08/17/17 1649 Oral     SpO2 08/17/17 1649 100 %     Weight --      Height --      Head Circumference --      Peak Flow --      Pain Score 08/17/17 1647 9     Pain Loc --      Pain Edu? --      Excl. in GC? --    No data found.  Updated Vital Signs BP (!) 160/112 (BP Location: Left Arm)   Pulse 85   Temp 99.9 F (37.7 C) (Oral)   SpO2 100%    Physical Exam  Constitutional: He is oriented to person, place, and time. He appears well-developed and well-nourished.  HENT:  Head: Normocephalic and atraumatic.  Right Ear: Tympanic  membrane, external ear and ear canal normal.  Left Ear: Tympanic membrane, external ear and ear canal normal.  Nose: Nose normal. Right sinus exhibits no maxillary sinus tenderness and no frontal sinus tenderness. Left sinus exhibits no maxillary sinus tenderness and no frontal sinus tenderness.  Mouth/Throat: Uvula is midline and mucous membranes are normal. Posterior oropharyngeal erythema present. Tonsils are 1+ on the right. Tonsils are 1+ on the left. Tonsillar exudate.  Significant redness to left tonsil and posterior oropharynx with exudate noted; + adenopathy with tenderness to left   Eyes: Pupils are equal, round, and reactive to light. Conjunctivae are normal.  Neck: Normal range of motion.  Cardiovascular: Normal rate and regular rhythm.  Pulmonary/Chest: Effort normal and breath sounds normal.  Lymphadenopathy:    He has cervical adenopathy.  Neurological: He is alert and oriented to person, place, and time.  Skin: Skin is warm and dry.  Vitals reviewed.    UC Treatments / Results  Labs (all labs ordered are listed, but only abnormal results are displayed) Labs Reviewed  CULTURE, GROUP A STREP [facility])  POCT RAPID STREP A  CYTOLOGY, (ORAL, ANAL, URETHRAL) ANCILLARY ONLY    EKG None  Radiology No results found.  Procedures Procedures (including critical care time)  Medications Ordered in UC Medications  ibuprofen (ADVIL,MOTRIN) tablet 800 mg (800 mg Oral Given 08/17/17 1806)    Initial Impression / Assessment and Plan / UC Course  I have reviewed the triage vital signs and the nursing notes.  Pertinent labs & imaging results that were available during my care of the patient were reviewed by me and considered in my medical decision making (see chart for details).     Negative rapid strep, sent for culture, gc/chlamydia collected as well. 99.9 in clinic today with quite significant tenderness and adenopathy, opted to initiate amoxicillin at this time. Return  precautions provided. Patient verbalized understanding and agreeable to plan.    Final Clinical Impressions(s) / UC Diagnoses   Final diagnoses:  Pharyngitis, unspecified etiology     Discharge Instructions     Your rapid strep was negative today. We have sent your throat swabs to be cultured and would call with any positive findings.  Due to findings on exam we will start antibiotics for this at this time. Complete course of antibiotics.  Push fluids to ensure adequate hydration and keep secretions thin.  Tylenol and/or ibuprofen as needed for pain or fevers.   If symptoms  worsen or do not improve in the next week to return to be seen or to follow up with your PCP.      ED Prescriptions    Medication Sig Dispense Auth. Provider   amoxicillin (AMOXIL) 500 MG capsule Take 1 capsule (500 mg total) by mouth 2 (two) times daily for 10 days. 20 capsule Georgetta Haber, NP     Controlled Substance Prescriptions [place] Controlled Substance Registry consulted? Not Applicable   Georgetta Haber, NP 08/17/17 1816

## 2017-08-18 LAB — CYTOLOGY, (ORAL, ANAL, URETHRAL) ANCILLARY ONLY
Chlamydia: NEGATIVE
Neisseria Gonorrhea: NEGATIVE
Trichomonas: POSITIVE — AB

## 2017-08-19 ENCOUNTER — Telehealth (HOSPITAL_COMMUNITY): Payer: Self-pay

## 2017-08-19 MED ORDER — METRONIDAZOLE 500 MG PO TABS: 500.0000 mg | ORAL_TABLET | Freq: Two times a day (BID) | ORAL | 0 refills | Status: DC

## 2017-08-19 NOTE — Telephone Encounter (Signed)
Trichomonas is positive. Rx metronidazole 500mg  bid x 7d #14 no refills was sent to the pharmacy of record. Need to educate patient to refrain from sexual intercourse for 7 days to give the medicine time to work. Sexual partners need to be notified and tested/treated. Condoms may reduce risk of reinfection.  Recheck for further evaluation if symptoms are not improving.Attempted to reach patient, no answer and no voicemail available.

## 2017-08-20 LAB — CULTURE, GROUP A STREP (THRC)

## 2017-08-23 ENCOUNTER — Telehealth (HOSPITAL_COMMUNITY): Payer: Self-pay

## 2017-08-23 NOTE — Telephone Encounter (Signed)
Pt came to office on May 17 and went over results with this RN and was educated on safe sex practices. Pt stated he was picking up medication to treat that day.

## 2017-12-23 ENCOUNTER — Encounter: Payer: Self-pay | Admitting: Internal Medicine

## 2017-12-23 ENCOUNTER — Ambulatory Visit (INDEPENDENT_AMBULATORY_CARE_PROVIDER_SITE_OTHER): Payer: BLUE CROSS/BLUE SHIELD | Admitting: Internal Medicine

## 2017-12-23 ENCOUNTER — Other Ambulatory Visit: Payer: Self-pay

## 2017-12-23 VITALS — BP 141/91 | HR 65 | Temp 98.6°F | Ht 74.0 in | Wt 205.6 lb

## 2017-12-23 DIAGNOSIS — Z79899 Other long term (current) drug therapy: Secondary | ICD-10-CM

## 2017-12-23 DIAGNOSIS — I10 Essential (primary) hypertension: Secondary | ICD-10-CM

## 2017-12-23 DIAGNOSIS — X58XXXA Exposure to other specified factors, initial encounter: Secondary | ICD-10-CM

## 2017-12-23 DIAGNOSIS — T676XXA Heat fatigue, transient, initial encounter: Secondary | ICD-10-CM | POA: Diagnosis not present

## 2017-12-23 DIAGNOSIS — Z23 Encounter for immunization: Secondary | ICD-10-CM

## 2017-12-24 LAB — CBC WITH DIFFERENTIAL/PLATELET
BASOS: 1 %
Basophils Absolute: 0 10*3/uL (ref 0.0–0.2)
EOS (ABSOLUTE): 0.2 10*3/uL (ref 0.0–0.4)
EOS: 3 %
HEMATOCRIT: 39.7 % (ref 37.5–51.0)
Hemoglobin: 13 g/dL (ref 13.0–17.7)
IMMATURE GRANS (ABS): 0 10*3/uL (ref 0.0–0.1)
IMMATURE GRANULOCYTES: 0 %
Lymphocytes Absolute: 1.7 10*3/uL (ref 0.7–3.1)
Lymphs: 26 %
MCH: 28.4 pg (ref 26.6–33.0)
MCHC: 32.7 g/dL (ref 31.5–35.7)
MCV: 87 fL (ref 79–97)
MONOS ABS: 0.4 10*3/uL (ref 0.1–0.9)
Monocytes: 7 %
NEUTROS PCT: 63 %
Neutrophils Absolute: 4 10*3/uL (ref 1.4–7.0)
Platelets: 237 10*3/uL (ref 150–450)
RBC: 4.57 x10E6/uL (ref 4.14–5.80)
RDW: 14.9 % (ref 12.3–15.4)
WBC: 6.4 10*3/uL (ref 3.4–10.8)

## 2017-12-24 LAB — CMP14 + ANION GAP
ALBUMIN: 4.1 g/dL (ref 3.5–5.5)
ALT: 20 IU/L (ref 0–44)
ANION GAP: 13 mmol/L (ref 10.0–18.0)
AST: 25 IU/L (ref 0–40)
Albumin/Globulin Ratio: 1.6 (ref 1.2–2.2)
Alkaline Phosphatase: 82 IU/L (ref 39–117)
BUN / CREAT RATIO: 16 (ref 9–20)
BUN: 17 mg/dL (ref 6–24)
Bilirubin Total: <0.2 mg/dL (ref 0.0–1.2)
CALCIUM: 9.3 mg/dL (ref 8.7–10.2)
CO2: 24 mmol/L (ref 20–29)
CREATININE: 1.04 mg/dL (ref 0.76–1.27)
Chloride: 104 mmol/L (ref 96–106)
GFR calc Af Amer: 90 mL/min/{1.73_m2} (ref >59)
GFR, EST NON AFRICAN AMERICAN: 78 mL/min/{1.73_m2} (ref >59)
GLOBULIN, TOTAL: 2.5 g/dL (ref 1.5–4.5)
Glucose: 96 mg/dL (ref 65–99)
Potassium: 4.7 mmol/L (ref 3.5–5.2)
SODIUM: 141 mmol/L (ref 134–144)
Total Protein: 6.6 g/dL (ref 6.0–8.5)

## 2017-12-24 NOTE — Assessment & Plan Note (Addendum)
HPI: Few days ago, hot day, was dringing in his car without A/C, started to sweat and feel unwell, no palpiatations.  Turned on A/C but still feel unwell so pulled off road until feeling passed.  Did not drink much water that day at work, did drink grape drink soda.  A: Heat fatigue  P: Discussed importance of water consumption on hot days, that BP meds can make him prone to dehydration.

## 2017-12-26 NOTE — Assessment & Plan Note (Signed)
HPI: Wanted to come in for evaluation and BP check, recent episode of heat fatigue and was concerned Otherwise  No symptoms and reports adherence to medications except has not taken this morning.  A: Essential HTN mildly above goal  P: Continue Lisinopril-HCTZ 20-25mg  daily, amlodipine 5mg  daily. Check BMP

## 2017-12-26 NOTE — Progress Notes (Signed)
  Subjective:  HPI: Antonio Ramos is a 57 y.o. male who presents for hypertension follow up.  Please see Assessment and Plan below for the status of his chronic medical problems.  Review of Systems: Review of Systems  Constitutional: Negative for chills, fever, malaise/fatigue and weight loss.  Eyes: Negative for blurred vision and double vision.  Respiratory: Negative for cough and shortness of breath.   Cardiovascular: Negative for chest pain and palpitations.  Gastrointestinal: Negative for abdominal pain.  Skin: Negative for rash.  Neurological: Negative for dizziness and headaches.    Objective:  Physical Exam: Vitals:   12/23/17 0834  BP: (!) 141/91  Pulse: 65  Temp: 98.6 F (37 C)  TempSrc: Oral  SpO2: 99%  Weight: 205 lb 9.6 oz (93.3 kg)  Height: 6\' 2"  (1.88 m)   Physical Exam  Constitutional: He appears well-developed and well-nourished.  HENT:  Mouth/Throat: Oropharynx is clear and moist.  Eyes: Conjunctivae are normal.  Cardiovascular: Normal rate, regular rhythm and normal heart sounds.  Pulmonary/Chest: Effort normal and breath sounds normal.  Musculoskeletal: He exhibits no edema.  Nursing note and vitals reviewed.  Assessment & Plan:  Heat fatigue, transient, initial encounter HPI: Few days ago, hot day, was dringing in his car without A/C, started to sweat and feel unwell, no palpiatations.  Turned on A/C but still feel unwell so pulled off road until feeling passed.  Did not drink much water that day at work, did drink grape drink soda.  A: Heat fatigue  P: Discussed importance of water consumption on hot days, that BP meds can make him prone to dehydration.  Essential hypertension HPI: Wanted to come in for evaluation and BP check, recent episode of heat fatigue and was concerned Otherwise  No symptoms and reports adherence to medications except has not taken this morning.  A: Essential HTN mildly above goal  P: Continue Lisinopril-HCTZ 20-25mg   daily, amlodipine 5mg  daily. Check BMP   Medications Ordered No orders of the defined types were placed in this encounter.  Other Orders Orders Placed This Encounter  Procedures  . Flu Vaccine QUAD 36+ mos IM  . CMP14 + Anion Gap  . CBC with Diff   Follow Up: Return in about 6 months (around 06/23/2018).

## 2018-06-16 ENCOUNTER — Encounter: Payer: Self-pay | Admitting: Internal Medicine

## 2018-06-16 ENCOUNTER — Ambulatory Visit (INDEPENDENT_AMBULATORY_CARE_PROVIDER_SITE_OTHER): Payer: BLUE CROSS/BLUE SHIELD | Admitting: Internal Medicine

## 2018-06-16 ENCOUNTER — Other Ambulatory Visit (HOSPITAL_COMMUNITY)
Admission: RE | Admit: 2018-06-16 | Discharge: 2018-06-16 | Disposition: A | Discharge status: Home / Self Care | Payer: BLUE CROSS/BLUE SHIELD | Source: Ambulatory Visit | Attending: Internal Medicine | Admitting: Internal Medicine

## 2018-06-16 ENCOUNTER — Other Ambulatory Visit: Payer: Self-pay

## 2018-06-16 VITALS — BP 153/104 | HR 72 | Temp 98.1°F | Ht 74.0 in | Wt 204.5 lb

## 2018-06-16 DIAGNOSIS — Z1331 Encounter for screening for depression: Secondary | ICD-10-CM | POA: Insufficient documentation

## 2018-06-16 DIAGNOSIS — I10 Essential (primary) hypertension: Secondary | ICD-10-CM

## 2018-06-16 DIAGNOSIS — Z1211 Encounter for screening for malignant neoplasm of colon: Secondary | ICD-10-CM

## 2018-06-16 DIAGNOSIS — M75101 Unspecified rotator cuff tear or rupture of right shoulder, not specified as traumatic: Secondary | ICD-10-CM

## 2018-06-16 DIAGNOSIS — Z7251 High risk heterosexual behavior: Secondary | ICD-10-CM | POA: Diagnosis not present

## 2018-06-16 DIAGNOSIS — Z79899 Other long term (current) drug therapy: Secondary | ICD-10-CM

## 2018-06-16 DIAGNOSIS — Z23 Encounter for immunization: Secondary | ICD-10-CM

## 2018-06-16 DIAGNOSIS — G8929 Other chronic pain: Secondary | ICD-10-CM

## 2018-06-16 DIAGNOSIS — Z87438 Personal history of other diseases of male genital organs: Secondary | ICD-10-CM

## 2018-06-16 DIAGNOSIS — Z720 Tobacco use: Secondary | ICD-10-CM

## 2018-06-16 NOTE — Assessment & Plan Note (Signed)
Had history of trichomonas back in May 2019.  This is treated he reports no current dysuria or other symptoms.  However he remains concerned about STD testing and is requesting testing.  He does not want to give blood today but provided a urine sample.  We will check for GC chlamydia and trichomonas

## 2018-06-16 NOTE — Patient Instructions (Signed)
I want you to restart taking both of your blood pressure medications.

## 2018-06-16 NOTE — Assessment & Plan Note (Signed)
HPI: He reports he is only been taking "the pink pill and not the white pill", he has both pill bottles with him the pink pill is the lisinopril hydrochlorothiazide.  In white pill is the 5 mg of amlodipine.  Interestingly both were last filled May 2019 per the bottle.  When asked him about this he says that is because he was taking just a white pill and then he switched over to just the pink pill.  Assessment essential hypertension not at goal  Plan Discussed with him the importance of taking both medications.  I suspect there is a large degree of nonadherence to the medications. Continue lisinopril hydrochlorothiazide 20-25 mg continue amlodipine 5 mg daily

## 2018-06-16 NOTE — Assessment & Plan Note (Signed)
HPI: He reports that his right shoulder continues to bother him it is mainly painful at night.  He never followed up with orthopedic surgery he has some trouble remembering the name of the physician when I brought up Dr. Carlos Levering name he thinks that is who he saw.  Assessment history of right rotator cuff tear and chronic right shoulder pain  Plan Advised him to follow-up with orthopedic surgery, I still cannot find any accurate records in our system will place a referral to please facilitate this follow up

## 2018-06-16 NOTE — Assessment & Plan Note (Signed)
Reports 1 pack will last about a week.  Trying to cut down mostly smokes at work due to stress.  We discussed importance of quitting altogether.

## 2018-06-16 NOTE — Progress Notes (Signed)
Subjective:  HPI: Antonio Ramos is a 58 y.o. male who presents for follow up HTN  Please see Assessment and Plan below for the status of his chronic medical problems.  Review of Systems: Review of Systems  Constitutional: Negative for fever, malaise/fatigue and weight loss.  Eyes: Negative for blurred vision.  Respiratory: Negative for cough.   Cardiovascular: Negative for chest pain.  Genitourinary: Negative for dysuria.  Musculoskeletal: Positive for joint pain.  Endo/Heme/Allergies: Negative for polydipsia.    Objective:  Physical Exam: Vitals:   06/16/18 1117  BP: (!) 153/104  Pulse: 72  Temp: 98.1 F (36.7 C)  TempSrc: Oral  SpO2: 100%  Weight: 204 lb 8 oz (92.8 kg)  Height: 6\' 2"  (1.88 m)   Body mass index is 26.26 kg/m. Physical Exam Nursing note reviewed.  Constitutional:      Appearance: Normal appearance.  Cardiovascular:     Rate and Rhythm: Normal rate and regular rhythm.  Pulmonary:     Effort: Pulmonary effort is normal.     Breath sounds: Normal breath sounds.  Musculoskeletal:     Right shoulder: He exhibits pain. He exhibits normal range of motion, no tenderness, no effusion, no crepitus, no deformity and no spasm.     Left shoulder: He exhibits normal range of motion and no tenderness.  Skin:    General: Skin is warm and dry.    Assessment & Plan:  Essential hypertension HPI: He reports he is only been taking "the pink pill and not the white pill", he has both pill bottles with him the pink pill is the lisinopril hydrochlorothiazide.  In white pill is the 5 mg of amlodipine.  Interestingly both were last filled May 2019 per the bottle.  When asked him about this he says that is because he was taking just a white pill and then he switched over to just the pink pill.  Assessment essential hypertension not at goal  Plan Discussed with him the importance of taking both medications.  I suspect there is a large degree of nonadherence to the  medications. Continue lisinopril hydrochlorothiazide 20-25 mg continue amlodipine 5 mg daily  Right rotator cuff tear HPI: He reports that his right shoulder continues to bother him it is mainly painful at night.  He never followed up with orthopedic surgery he has some trouble remembering the name of the physician when I brought up Dr. Carlos Levering name he thinks that is who he saw.  Assessment history of right rotator cuff tear and chronic right shoulder pain  Plan Advised him to follow-up with orthopedic surgery, I still cannot find any accurate records in our system will place a referral to please facilitate this follow up  Tobacco abuse Reports 1 pack will last about a week.  Trying to cut down mostly smokes at work due to stress.  We discussed importance of quitting altogether.  Positive depression screening Patient completed a PHQ 9 screen in the office this was markedly high.  Unfortunately I did not get a hold of this sheet until after the visit was over.  I tried calling [place] but he did not answer.  I will try to touch base with him to see if he accurately completed this sheet if so I would consider starting SSRI or referral for behavioral health.  High risk sexual behavior Had history of trichomonas back in May 2019.  This is treated he reports no current dysuria or other symptoms.  However he remains concerned about STD testing and  is requesting testing.  He does not want to give blood today but provided a urine sample.  We will check for GC chlamydia and trichomonas   Medications Ordered No orders of the defined types were placed in this encounter.  Other Orders Orders Placed This Encounter  Procedures  . Fecal occult blood, imunochemical    Standing Status:   Future    Standing Expiration Date:   06/16/2019  . Tdap vaccine greater than or equal to 7yo IM  . Ambulatory referral to Orthopedic Surgery    Referral Priority:   Routine    Referral Type:   Surgical    Referral  Reason:   Specialty Services Required    Requested Specialty:   Orthopedic Surgery    Number of Visits Requested:   1   Follow Up: Return in about 3 months (around 09/16/2018).

## 2018-06-16 NOTE — Assessment & Plan Note (Signed)
Patient completed a PHQ 9 screen in the office this was markedly high.  Unfortunately I did not get a hold of this sheet until after the visit was over.  I tried calling Antonio Ramos but he did not answer.  I will try to touch base with him to see if he accurately completed this sheet if so I would consider starting SSRI or referral for behavioral health.

## 2018-06-17 LAB — URINE CYTOLOGY ANCILLARY ONLY
Chlamydia: NEGATIVE
Neisseria Gonorrhea: NEGATIVE
Trichomonas: NEGATIVE

## 2018-06-20 ENCOUNTER — Other Ambulatory Visit: Payer: BLUE CROSS/BLUE SHIELD

## 2018-06-20 ENCOUNTER — Telehealth: Payer: Self-pay | Admitting: *Deleted

## 2018-06-20 DIAGNOSIS — Z1211 Encounter for screening for malignant neoplasm of colon: Secondary | ICD-10-CM

## 2018-06-20 NOTE — Telephone Encounter (Signed)
Pt presents at lab to release specimen, he request results from urine, dr Mikey Bussing gave verbally, request printed copy, given per dr Mikey Bussing.

## 2018-06-21 LAB — FECAL OCCULT BLOOD, IMMUNOCHEMICAL: Fecal Occult Bld: NEGATIVE

## 2018-06-23 ENCOUNTER — Encounter: Payer: Self-pay | Admitting: Internal Medicine

## 2018-10-27 ENCOUNTER — Encounter: Payer: Self-pay | Admitting: Internal Medicine

## 2018-10-27 ENCOUNTER — Ambulatory Visit (INDEPENDENT_AMBULATORY_CARE_PROVIDER_SITE_OTHER): Payer: BC Managed Care – PPO | Admitting: Internal Medicine

## 2018-10-27 ENCOUNTER — Other Ambulatory Visit: Payer: Self-pay

## 2018-10-27 VITALS — BP 137/83 | HR 73 | Temp 97.9°F | Wt 211.3 lb

## 2018-10-27 DIAGNOSIS — Z79899 Other long term (current) drug therapy: Secondary | ICD-10-CM | POA: Diagnosis not present

## 2018-10-27 DIAGNOSIS — M7541 Impingement syndrome of right shoulder: Secondary | ICD-10-CM | POA: Diagnosis not present

## 2018-10-27 DIAGNOSIS — I10 Essential (primary) hypertension: Secondary | ICD-10-CM | POA: Diagnosis not present

## 2018-10-27 MED ORDER — LISINOPRIL-HYDROCHLOROTHIAZIDE 20-25 MG PO TABS: 1.0000 | ORAL_TABLET | Freq: Every day | ORAL | 3 refills | Status: DC

## 2018-10-27 MED ORDER — AMLODIPINE BESYLATE 5 MG PO TABS: 5.0000 mg | ORAL_TABLET | Freq: Every day | ORAL | 3 refills | Status: DC

## 2018-10-31 NOTE — Progress Notes (Signed)
  Subjective:  HPI: Antonio Ramos is a 58 y.o. male who presents for f/u HTN  Please see Assessment and Plan below for the status of his chronic medical problems.  Review of Systems: Review of Systems  Constitutional: Negative for fever.  HENT: Negative for hearing loss.   Eyes: Negative for blurred vision.  Cardiovascular: Negative for chest pain.  Gastrointestinal: Negative for blood in stool and melena.  Genitourinary: Negative for dysuria.  Musculoskeletal: Positive for joint pain. Negative for myalgias.  Neurological: Negative for dizziness.    Objective:  Physical Exam: Vitals:   10/27/18 0859 10/27/18 0901  BP: (!) 167/96 137/83  Pulse: 78 73  Temp: 97.9 F (36.6 C)   TempSrc: Oral   SpO2: 98%   Weight: 211 lb 4.8 oz (95.8 kg)    Body mass index is 27.13 kg/m. Physical Exam Vitals signs and nursing note reviewed.  Constitutional:      Appearance: Normal appearance.  Cardiovascular:     Rate and Rhythm: Normal rate and regular rhythm.  Pulmonary:     Effort: Pulmonary effort is normal.     Breath sounds: Normal breath sounds.  Musculoskeletal:     Right shoulder: He exhibits decreased range of motion. He exhibits no tenderness, no bony tenderness and no swelling.     Left shoulder: He exhibits normal range of motion, no tenderness and no swelling.     Comments: + neers impingement  Neurological:     Mental Status: He is alert.    Assessment & Plan:  See Encounters Tab for problem based charting.  PROCEDURE NOTE  PROCEDURE: right shoulder joint steroid injection.  PREOPERATIVE DIAGNOSIS: Impingment of the right shoulder.  POSTOPERATIVE DIAGNOSIS: impingment of the right shoulder.  PROCEDURE: The patient was apprised of the risks and the benefits of the procedure and informed consent was obtained. Time-out procedure was performed, with confirmation of the patient's name, date of birth, and correct identification of the right shoulder to be injected. The  patient's shoulder was then marked at the appropriate site for injection placement. The shoulder was sterilely prepped with Betadine. A 40 mg (1 milliliter) solution of Kenalog was drawn up into a 3 mL syringe with a 2 mL of 1% lidocaine. The patient was injected with a 25 gauge needle at the lateral  aspect of his  right shoulder. There were no complications. The patient tolerated the procedure well. There was minimal bleeding. The patient was instructed to ice her shoulder upon leaving clinic and refrain from overuse over the next 3 days. The patient was instructed to go to the emergency room with any usual pain, swelling, or redness occurred in the injected area. The patient was given a followup appointment to evaluate response to the injection to his increased range of motion and reduction of pain.  Medications Ordered Meds ordered this encounter  Medications  . lisinopril-hydrochlorothiazide (ZESTORETIC) 20-25 MG tablet    Sig: Take 1 tablet by mouth daily.    Dispense:  90 tablet    Refill:  3  . amLODipine (NORVASC) 5 MG tablet    Sig: Take 1 tablet (5 mg total) by mouth daily.    Dispense:  90 tablet    Refill:  3   Other Orders No orders of the defined types were placed in this encounter.  Follow Up: Return in about 6 months (around 04/29/2019), or if symptoms worsen or fail to improve.

## 2018-10-31 NOTE — Assessment & Plan Note (Signed)
HPI: He requests another steroid injection for his right shoulder.  As previously noted he saw orthopedics which told him that he had at least a partial right rotator cuff tear.  He was understood and surgery and reports overall improvement of symptoms he especially did well with a steroid injection a few months ago.  He has not been back to orthopedic surgery but feels he can manage well with another steroid injection.  Assessment right shoulder impingement syndrome  Plan Repeat steroid injection today

## 2018-10-31 NOTE — Assessment & Plan Note (Signed)
HPI: Antonio Ramos reports that he has run out of 1 of his blood pressure medications he believes that this combination pill and he has not been taking it lately.  Otherwise he reports no side effects and overall feels pretty good.  Assessment essential hypertension above goal  Plan Discussed importance of proactive request of refills, will send in a year supply of lisinopril-hydrochlorothiazide 20-25 as well as amlodipine 5 mg daily.

## 2018-11-14 ENCOUNTER — Other Ambulatory Visit (INDEPENDENT_AMBULATORY_CARE_PROVIDER_SITE_OTHER): Payer: Self-pay | Admitting: Nurse Practitioner

## 2018-12-02 ENCOUNTER — Other Ambulatory Visit: Payer: Self-pay

## 2018-12-02 ENCOUNTER — Encounter (HOSPITAL_COMMUNITY): Payer: Self-pay | Admitting: Emergency Medicine

## 2018-12-02 ENCOUNTER — Emergency Department (HOSPITAL_COMMUNITY)
Admission: EM | Admit: 2018-12-02 | Discharge: 2018-12-02 | Disposition: A | Discharge status: Home / Self Care | Payer: BC Managed Care – PPO | Attending: Emergency Medicine | Admitting: Emergency Medicine

## 2018-12-02 DIAGNOSIS — S50862A Insect bite (nonvenomous) of left forearm, initial encounter: Secondary | ICD-10-CM | POA: Diagnosis not present

## 2018-12-02 DIAGNOSIS — Y929 Unspecified place or not applicable: Secondary | ICD-10-CM | POA: Diagnosis not present

## 2018-12-02 DIAGNOSIS — Z87891 Personal history of nicotine dependence: Secondary | ICD-10-CM | POA: Insufficient documentation

## 2018-12-02 DIAGNOSIS — T63441A Toxic effect of venom of bees, accidental (unintentional), initial encounter: Secondary | ICD-10-CM | POA: Diagnosis not present

## 2018-12-02 DIAGNOSIS — W57XXXA Bitten or stung by nonvenomous insect and other nonvenomous arthropods, initial encounter: Secondary | ICD-10-CM | POA: Insufficient documentation

## 2018-12-02 DIAGNOSIS — Z79899 Other long term (current) drug therapy: Secondary | ICD-10-CM | POA: Insufficient documentation

## 2018-12-02 DIAGNOSIS — T7840XA Allergy, unspecified, initial encounter: Secondary | ICD-10-CM

## 2018-12-02 DIAGNOSIS — Y999 Unspecified external cause status: Secondary | ICD-10-CM | POA: Diagnosis not present

## 2018-12-02 DIAGNOSIS — Y9389 Activity, other specified: Secondary | ICD-10-CM | POA: Diagnosis not present

## 2018-12-02 DIAGNOSIS — I10 Essential (primary) hypertension: Secondary | ICD-10-CM | POA: Diagnosis not present

## 2018-12-02 MED ORDER — DIPHENHYDRAMINE HCL 50 MG/ML IJ SOLN
50.0000 mg | Freq: Once | INTRAMUSCULAR | Status: AC
  Administered 2018-12-02: 50 mg via INTRAVENOUS
  Filled 2018-12-02: qty 1

## 2018-12-02 MED ORDER — FAMOTIDINE IN NACL 20-0.9 MG/50ML-% IV SOLN
20.0000 mg | Freq: Once | INTRAVENOUS | Status: AC
  Administered 2018-12-02: 20 mg via INTRAVENOUS
  Filled 2018-12-02: qty 50

## 2018-12-02 MED ORDER — METHYLPREDNISOLONE SODIUM SUCC 125 MG IJ SOLR
125.0000 mg | Freq: Once | INTRAMUSCULAR | Status: AC
  Administered 2018-12-02: 125 mg via INTRAVENOUS
  Filled 2018-12-02: qty 2

## 2018-12-02 NOTE — Discharge Instructions (Signed)
You were seen in the ED today after being stung by a bee We have given  you medications to help with an allergic reaction you were having It is recommended that you continue taking Benadryl for the next couple of days to help with the allergic response. You already received Benadryl in the ED today and therefore don't need to take anymore tonight.  Please follow up with your PCP.  Return to the ED immediately for any worsening symptoms including shortness of breath, vomiting, hives, facial swelling

## 2018-12-02 NOTE — ED Notes (Signed)
ED Provider at bedside. 

## 2018-12-02 NOTE — ED Provider Notes (Signed)
[facility] EMERGENCY DEPARTMENT Provider Note   CSN: [id number] Arrival date & time: 12/02/18  1148     History   Chief Complaint Chief Complaint  Patient presents with  . Allergic Reaction  . Insect Bite    HPI Antonio Ramos is a 58 y.o. male presents to the ED today after being stung by a bee on his left forearm around 1030 this morning.  States he was working outside when he felt something crawling on his arm; he went swatted away when it stung him.  Patient states that the stinger fell out.  He went home and washed the area with soap and water.  Patient states about 30 to 45 minutes later he started noticing some diffuse body itching.  He then noticed that he was having hives and started complaining of some swelling to his lower lip.  Patient has not taken any antihistamines prior to arrival.  He states he has been stung by bees in the past without issue.  He has never needed epinephrine for anything.  Denies shortness of breath, throat swelling, nausea, vomiting, abdominal pain, any other associated symptoms.        Past Medical History:  Diagnosis Date  . Blood transfusion without reported diagnosis   . Hemorrhagic shock ([place]) 11/23/2012  . Hypertension   . MI (myocardial infarction) ([place])    per patient report, unconfimred  . PUD (peptic ulcer disease)    Diagnosed in 1986, with 3 prior bleeds since that time, most recently in 2004    Patient Active Problem List   Diagnosis Date Noted  . Positive depression screening 06/16/2018  . High risk sexual behavior 06/16/2018  . Heat fatigue, transient, initial encounter 12/23/2017  . Urinary frequency 04/30/2017  . Right rotator cuff tear 01/05/2017  . Shoulder impingement syndrome, right 12/17/2014  . 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 Risk for coronary artery disease between 10% and 20% in next 10 years per Framingham score 07/21/2013  . Tobacco abuse 02/02/2013  . Preventative health care 02/02/2013  .  History of peptic ulcer disease 11/21/2012  . Essential hypertension 11/21/2012    Past Surgical History:  Procedure Laterality Date  . ESOPHAGOGASTRODUODENOSCOPY N/A 11/23/2012   Procedure: ESOPHAGOGASTRODUODENOSCOPY (EGD);  Surgeon: Jeryl Columbia, MD;  Location: [facility] ENDOSCOPY;  Service: Endoscopy;  Laterality: N/A;  . UPPER GI ENDOSCOPY          Home Medications    Prior to Admission medications   Medication Sig Start Date End Date Taking? Authorizing Provider  albuterol (PROVENTIL HFA;VENTOLIN HFA) 108 (90 BASE) MCG/ACT inhaler Inhale 1-2 puffs into the lungs every 6 (six) hours as needed for wheezing or shortness of breath. Patient not taking: Reported on 12/17/2014 02/04/14   Elnora Morrison, MD  amLODipine (NORVASC) 5 MG tablet Take 1 tablet (5 mg total) by mouth daily. 10/27/18 10/27/19  [name], DO  diclofenac sodium (VOLTAREN) 1 % GEL Apply 2 g topically 4 (four) times daily. 04/09/16   [name], DO  lisinopril-hydrochlorothiazide (ZESTORETIC) 20-25 MG tablet Take 1 tablet by mouth daily. 10/27/18   [name], DO    Family History Family History  Problem Relation Age of Onset  . Heart attack Father        at age of 62    Social History Social History   Tobacco Use  . Smoking status: Former Smoker    Packs/day: 0.20    Years: 24.00    Pack years: 4.80  Types: Cigarettes    Quit date: 03/17/2014    Years since quitting: 4.7  . Smokeless tobacco: Never Used  Substance Use Topics  . Alcohol use: No    Alcohol/week: 1.0 standard drinks    Types: 1 Standard drinks or equivalent per week    Comment: states used to drink heavily in 1983  . Drug use: No     Allergies   Patient has no known allergies.   Review of Systems Review of Systems  Constitutional: Negative for chills and fever.  HENT: Positive for facial swelling. Negative for trouble swallowing.   Eyes: Negative for visual disturbance.  Respiratory: Negative for cough and shortness of  breath.   Cardiovascular: Negative for chest pain.  Gastrointestinal: Negative for abdominal pain, nausea and vomiting.  Skin: Positive for rash.     Physical Exam Updated Vital Signs BP (!) 166/99 (BP Location: Right Arm)   Pulse (!) 115   Temp 98.4 F (36.9 C) (Oral)   Resp 14   Ht 6\' 2"  (1.88 m)   Wt 93.4 kg   SpO2 98%   BMI 26.45 kg/m   Physical Exam Vitals signs and nursing note reviewed.  Constitutional:      Appearance: He is not ill-appearing.  HENT:     Head: Normocephalic and atraumatic.     Comments: Mild amount of swelling noted to lower lip.  No posterior oropharyngeal edema or erythema.  Airway intact.  Eyes:     Conjunctiva/sclera: Conjunctivae normal.  Neck:     Musculoskeletal: Neck supple.  Cardiovascular:     Rate and Rhythm: Normal rate and regular rhythm.  Pulmonary:     Effort: Pulmonary effort is normal.     Breath sounds: Normal breath sounds. No wheezing, rhonchi or rales.     Comments: No wheezes appreciated. Abdominal:     Palpations: Abdomen is soft.     Tenderness: There is no abdominal tenderness. There is no guarding or rebound.  Skin:    General: Skin is warm and dry.     Comments: Urticarial type rash diffusely to body.   Neurological:     Mental Status: He is alert.      ED Treatments / Results  Labs (all labs ordered are listed, but only abnormal results are displayed) Labs Reviewed - No data to display  EKG None  Radiology No results found.  Procedures Procedures (including critical care time)  Medications Ordered in ED Medications  methylPREDNISolone sodium succinate (SOLU-MEDROL) 125 mg/2 mL injection 125 mg (125 mg Intravenous Given 12/02/18 1354)  famotidine (PEPCID) IVPB 20 mg premix (0 mg Intravenous Stopped 12/02/18 1429)  diphenhydrAMINE (BENADRYL) injection 50 mg (50 mg Intravenous Given 12/02/18 1354)     Initial Impression / Assessment and Plan / ED Course  I have reviewed the triage vital signs and  the nursing notes.  Pertinent labs & imaging results that were available during my care of the patient were reviewed by me and considered in my medical decision making (see chart for details).    58 year old male who presents to the ED after being stung by a bee.  Currently complaining of urticarial type rash and swelling to lower lip.  No airway involvement.  Never needed epinephrine for any bee sting in the past.  He is satting 100% on room air and speaking in full sentences without accessory muscle use.  he has not taken any antihistamines prior to arrival.  Will start IV and give Benadryl, Pepcid, Solu-Medrol and  evaluate patient in the ED for a couple of hours.  Is in agreement with plan at this time.   Remain in the emergency department for over 4 hours.  Upon reevaluation he is no longer complaining of any itching and states that the swelling of his lip has improved.  Still not complaining of any shortness of breath or throat swelling.  Patient was initially tachycardic on arrival but his pulse is now in the 70s.  Advised that he continue taking Benadryl at home for the next few days to help with the allergic response.  Patient advised to follow-up with his primary care physician.  Strict return precautions discussed with patient.  He is in agreement with plan at this time stable for discharge home.   This note was prepared using Dragon voice recognition software and may include unintentional dictation errors due to the inherent limitations of voice recognition software.       Final Clinical Impressions(s) / ED Diagnoses   Final diagnoses:  Allergic reaction, initial encounter    ED Discharge Orders    None       Tanda Rockers, PA-C 12/02/18 1601    Pricilla Loveless, MD 12/02/18 [phone]

## 2018-12-02 NOTE — ED Triage Notes (Signed)
Pt reports being stung by a bee at 1030 this monring. Pt reports some itching and swelling of his lip. Pt in NAD in triage.

## 2019-02-07 ENCOUNTER — Telehealth: Payer: Self-pay | Admitting: *Deleted

## 2019-02-07 NOTE — Telephone Encounter (Signed)
PATIENT CALLED REQUESTING APPOINTMENT WITH HIS PCP (E HOFFMAN). PATIENT AWARE THAT PCP DO NOT HAVE ANY SLOTS IN THE MONTH OF November.  WAITING FOR TO HEAR BACK FROM DR E. HOFFMAN.

## 2019-03-09 ENCOUNTER — Encounter: Payer: Self-pay | Admitting: Internal Medicine

## 2019-03-09 ENCOUNTER — Other Ambulatory Visit: Payer: Self-pay

## 2019-03-09 ENCOUNTER — Ambulatory Visit (INDEPENDENT_AMBULATORY_CARE_PROVIDER_SITE_OTHER): Payer: BC Managed Care – PPO | Admitting: Internal Medicine

## 2019-03-09 VITALS — BP 145/100 | HR 88 | Temp 98.3°F | Ht 74.0 in | Wt 217.5 lb

## 2019-03-09 DIAGNOSIS — G720 Drug-induced myopathy: Secondary | ICD-10-CM | POA: Diagnosis not present

## 2019-03-09 DIAGNOSIS — Z9189 Other specified personal risk factors, not elsewhere classified: Secondary | ICD-10-CM

## 2019-03-09 DIAGNOSIS — T466X5A Adverse effect of antihyperlipidemic and antiarteriosclerotic drugs, initial encounter: Secondary | ICD-10-CM | POA: Diagnosis not present

## 2019-03-09 DIAGNOSIS — Z79899 Other long term (current) drug therapy: Secondary | ICD-10-CM

## 2019-03-09 DIAGNOSIS — I10 Essential (primary) hypertension: Secondary | ICD-10-CM | POA: Diagnosis not present

## 2019-03-09 NOTE — Patient Instructions (Signed)
I want you to take both blood pressures pills (white and pink) take one in the morning and one at night.

## 2019-03-10 ENCOUNTER — Encounter: Payer: Self-pay | Admitting: Internal Medicine

## 2019-03-10 ENCOUNTER — Telehealth: Payer: Self-pay | Admitting: Internal Medicine

## 2019-03-10 LAB — LIPID PANEL
Chol/HDL Ratio: 4.5 ratio (ref 0.0–5.0)
Cholesterol, Total: 245 mg/dL — ABNORMAL HIGH (ref 100–199)
HDL: 54 mg/dL (ref >39)
LDL Chol Calc (NIH): 179 mg/dL — ABNORMAL HIGH (ref 0–99)
Triglycerides: 70 mg/dL (ref 0–149)
VLDL Cholesterol Cal: 12 mg/dL (ref 5–40)

## 2019-03-10 LAB — CBC WITH DIFFERENTIAL/PLATELET
Basophils Absolute: 0.1 10*3/uL (ref 0.0–0.2)
Basos: 1 %
EOS (ABSOLUTE): 0.2 10*3/uL (ref 0.0–0.4)
Eos: 2 %
Hematocrit: 44.8 % (ref 37.5–51.0)
Hemoglobin: 14.6 g/dL (ref 13.0–17.7)
Immature Grans (Abs): 0 10*3/uL (ref 0.0–0.1)
Immature Granulocytes: 0 %
Lymphocytes Absolute: 1.6 10*3/uL (ref 0.7–3.1)
Lymphs: 21 %
MCH: 27.8 pg (ref 26.6–33.0)
MCHC: 32.6 g/dL (ref 31.5–35.7)
MCV: 85 fL (ref 79–97)
Monocytes Absolute: 0.6 10*3/uL (ref 0.1–0.9)
Monocytes: 8 %
Neutrophils Absolute: 5.2 10*3/uL (ref 1.4–7.0)
Neutrophils: 68 %
Platelets: 228 10*3/uL (ref 150–450)
RBC: 5.25 x10E6/uL (ref 4.14–5.80)
RDW: 13 % (ref 11.6–15.4)
WBC: 7.6 10*3/uL (ref 3.4–10.8)

## 2019-03-10 LAB — BMP8+ANION GAP
Anion Gap: 18 mmol/L (ref 10.0–18.0)
BUN/Creatinine Ratio: 13 (ref 10–24)
BUN: 14 mg/dL (ref 8–27)
CO2: 21 mmol/L (ref 20–29)
Calcium: 10.1 mg/dL (ref 8.6–10.2)
Chloride: 103 mmol/L (ref 96–106)
Creatinine, Ser: 1.12 mg/dL (ref 0.76–1.27)
GFR calc Af Amer: 82 mL/min/{1.73_m2} (ref >59)
GFR calc non Af Amer: 71 mL/min/{1.73_m2} (ref >59)
Glucose: 102 mg/dL — ABNORMAL HIGH (ref 65–99)
Potassium: 4.6 mmol/L (ref 3.5–5.2)
Sodium: 142 mmol/L (ref 134–144)

## 2019-03-10 LAB — CK: Total CK: 693 U/L (ref 41–331)

## 2019-03-10 MED ORDER — AMLODIPINE-ATORVASTATIN 5-20 MG PO TABS: 1.0000 | ORAL_TABLET | Freq: Every day | ORAL | 3 refills | Status: DC

## 2019-03-10 NOTE — Assessment & Plan Note (Signed)
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 The CK is well within the normal range for a muscular gentleman like MR Antonio Ramos.  His shoulder pain was later to be found to be shoulder impingement and did not resolve with discontinuation of the statin.  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 Will restart statin and recheck CK at next visit.  Discussed symptoms to monitor for

## 2019-03-10 NOTE — Assessment & Plan Note (Signed)
HPI: continues to struggle with instructions to take both blood pressure pills. Denies any side effects but only taking the orange pill (Lisinopril-HCTZ) but not the white pill, again thinks I told him to stop the white pill at the last visit.  A: Essential HTN  P: Again I discussed the nature of HBP and the need for additional BP control, ideally his blood pressure should be <130/80. Continue Lisinopril-HCTZ 20-25mg  daily Restart Amlodpine 5mg  (will change to amlodipine atorvastatin 5-20)

## 2019-03-10 NOTE — Progress Notes (Signed)
  Subjective:  HPI: Antonio Ramos is a 58 y.o. male who presents for f/u HTN  Please see Assessment and Plan below for the status of his chronic medical problems.  Review of Systems: Review of Systems  Constitutional: Negative for malaise/fatigue.  HENT: Negative for hearing loss.   Eyes: Negative for blurred vision.  Respiratory: Negative for cough.   Cardiovascular: Negative for chest pain.  Gastrointestinal: Negative for blood in stool and melena.  Musculoskeletal: Negative for back pain, joint pain, myalgias and neck pain.  Neurological: Negative for dizziness and weakness.    Objective:  Physical Exam: Vitals:   03/09/19 0827  BP: (!) 145/100  Pulse: 88  Temp: 98.3 F (36.8 C)  TempSrc: Oral  SpO2: 98%  Weight: 217 lb 8 oz (98.7 kg)  Height: 6\' 2"  (1.88 m)   Body mass index is 27.93 kg/m. Physical Exam Constitutional:      Appearance: Normal appearance.  Cardiovascular:     Rate and Rhythm: Normal rate and regular rhythm.  Pulmonary:     Effort: Pulmonary effort is normal.     Breath sounds: Normal breath sounds.  Musculoskeletal:     Comments: Good ROM of b/l shoulders  Neurological:     Mental Status: He is alert.  Psychiatric:        Mood and Affect: Mood normal.    Assessment & Plan:  See Encounters Tab for problem based charting.  Medications Ordered Meds ordered this encounter  Medications  . amLODipine-atorvastatin (CADUET) 5-20 MG tablet    Sig: Take 1 tablet by mouth daily.    Dispense:  90 tablet    Refill:  3    Discontinue individual amlodipine Rx   Other Orders Orders Placed This Encounter  Procedures  . BMP8+Anion Gap  . CBC with Diff  . Lipid Profile  . CK, total   Follow Up: Return in about 3 months (around 06/07/2019).

## 2019-03-10 NOTE — Assessment & Plan Note (Signed)
ASCVD risk 17% in next 10 years.  Will reduce this by escalating HTN therapy and adding moderate intensity statin.  I do not think he truly had statin myopahty before.  Repeat CK at next visit along with repeat lipid panel.

## 2020-02-22 ENCOUNTER — Encounter: Payer: Self-pay | Admitting: Internal Medicine

## 2020-02-22 ENCOUNTER — Other Ambulatory Visit: Payer: Self-pay

## 2020-02-22 ENCOUNTER — Ambulatory Visit (INDEPENDENT_AMBULATORY_CARE_PROVIDER_SITE_OTHER): Payer: BC Managed Care – PPO | Admitting: Internal Medicine

## 2020-02-22 VITALS — BP 155/92 | HR 78 | Temp 98.0°F | Ht 74.0 in | Wt 210.8 lb

## 2020-02-22 DIAGNOSIS — Z23 Encounter for immunization: Secondary | ICD-10-CM | POA: Diagnosis not present

## 2020-02-22 DIAGNOSIS — Z9189 Other specified personal risk factors, not elsewhere classified: Secondary | ICD-10-CM

## 2020-02-22 DIAGNOSIS — M75101 Unspecified rotator cuff tear or rupture of right shoulder, not specified as traumatic: Secondary | ICD-10-CM | POA: Diagnosis not present

## 2020-02-22 DIAGNOSIS — I10 Essential (primary) hypertension: Secondary | ICD-10-CM

## 2020-02-22 MED ORDER — OLMESARTAN MEDOXOMIL-HCTZ 40-25 MG PO TABS: 1.0000 | ORAL_TABLET | Freq: Every day | ORAL | 11 refills | Status: DC

## 2020-02-22 MED ORDER — ROSUVASTATIN CALCIUM 10 MG PO TABS: 10.0000 mg | ORAL_TABLET | Freq: Every day | ORAL | 11 refills | Status: DC

## 2020-02-22 NOTE — Patient Instructions (Signed)
I am chaning your blood pressure medication to Olmesartan-HCTZ 40-25, you will take one pill a day.  I am also starting you on Rosuvastatin, take 10mg  once a day.

## 2020-02-23 LAB — BMP8+ANION GAP
Anion Gap: 15 mmol/L (ref 10.0–18.0)
BUN/Creatinine Ratio: 15 (ref 10–24)
BUN: 16 mg/dL (ref 8–27)
CO2: 22 mmol/L (ref 20–29)
Calcium: 9.4 mg/dL (ref 8.6–10.2)
Chloride: 103 mmol/L (ref 96–106)
Creatinine, Ser: 1.07 mg/dL (ref 0.76–1.27)
GFR calc Af Amer: 86 mL/min/{1.73_m2} (ref >59)
GFR calc non Af Amer: 75 mL/min/{1.73_m2} (ref >59)
Glucose: 99 mg/dL (ref 65–99)
Potassium: 4.1 mmol/L (ref 3.5–5.2)
Sodium: 140 mmol/L (ref 134–144)

## 2020-02-23 LAB — LIPID PANEL
Chol/HDL Ratio: 4.5 ratio (ref 0.0–5.0)
Cholesterol, Total: 201 mg/dL — ABNORMAL HIGH (ref 100–199)
HDL: 45 mg/dL (ref >39)
LDL Chol Calc (NIH): 138 mg/dL — ABNORMAL HIGH (ref 0–99)
Triglycerides: 101 mg/dL (ref 0–149)
VLDL Cholesterol Cal: 18 mg/dL (ref 5–40)

## 2020-02-26 NOTE — Progress Notes (Signed)
  Subjective:  HPI: Antonio Ramos is a 59 y.o. male who presents for f/u HTN  Please see Assessment and Plan below for the status of his chronic medical problems.  Review of Systems: Review of Systems  Eyes: Negative for blurred vision.  Respiratory: Negative for cough.   Cardiovascular: Negative for chest pain.  Gastrointestinal: Negative for melena.  Musculoskeletal: Negative for joint pain and myalgias.  Neurological: Negative for dizziness and weakness.    Objective:  Physical Exam: Vitals:   02/22/20 0819  BP: (!) 155/92  Pulse: 78  Temp: 98 F (36.7 C)  TempSrc: Oral  SpO2: 98%  Weight: 210 lb 12.8 oz (95.6 kg)  Height: 6\' 2"  (1.88 m)   Body mass index is 27.07 kg/m. Physical Exam Constitutional:      Appearance: Normal appearance.  Cardiovascular:     Rate and Rhythm: Normal rate and regular rhythm.  Pulmonary:     Effort: Pulmonary effort is normal.     Breath sounds: Normal breath sounds.  Musculoskeletal:     Comments: Good ROM of b/l shoulders, slightly painful arc of right shoulder  Neurological:     Mental Status: He is alert.  Psychiatric:        Mood and Affect: Mood normal.    Assessment & Plan:  See Encounters Tab for problem based charting.  Medications Ordered Meds ordered this encounter  Medications  . olmesartan-hydrochlorothiazide (BENICAR HCT) 40-25 MG tablet    Sig: Take 1 tablet by mouth daily.    Dispense:  30 tablet    Refill:  11    Will replace Lisinopril-HCTZ if affordable for patient  . rosuvastatin (CRESTOR) 10 MG tablet    Sig: Take 1 tablet (10 mg total) by mouth daily.    Dispense:  30 tablet    Refill:  11   Other Orders Orders Placed This Encounter  Procedures  . Flu Vaccine QUAD 36+ mos IM  . Lipid Profile  . BMP8+Anion Gap   Follow Up: Return in about 3 months (around 05/24/2020).

## 2020-02-26 NOTE — Assessment & Plan Note (Signed)
Has not been taking his atorvastatin. I am going to try starting rosuvastatin 10 mg daily.

## 2020-02-26 NOTE — Assessment & Plan Note (Signed)
HPI: He notes that his right shoulder is still somewhat bothersome but able to do all of the task his work requires. He has not followed up with orthopedic surgery because "they did not do nothing for me"  Assessment rotator cuff tear chronic and unchanged  Plan I discussed with him if his shoulder continues to bother him he really has no other choice than to follow-up with orthopedics and see what his options for treatment would be.

## 2020-02-26 NOTE — Assessment & Plan Note (Signed)
HPI: He reports he has not been taking amlodipine and only been taking lisinopril hydrochlorothiazide. He notes he feels dizzy headed when he also takes the amlodipine. He prefers to take only one blood pressure medication a day. He brings his lisinopril hydrochlorothiazide, the pill bottle is outdated from July he explains that he had extra medication.  Assessment essential hypertension not controlled  Plan The last several visits we really have not made any progress. I discussed with him I am going to try to make a different medication change I will change him to olmesartan hydrochlorothiazide. I will have him bring this medication to the next visit to make sure he did fill it and hopefully is taking it.

## 2020-03-26 ENCOUNTER — Ambulatory Visit (INDEPENDENT_AMBULATORY_CARE_PROVIDER_SITE_OTHER): Payer: BC Managed Care – PPO | Admitting: Internal Medicine

## 2020-03-26 ENCOUNTER — Other Ambulatory Visit: Payer: Self-pay

## 2020-03-26 VITALS — BP 149/102 | HR 78 | Temp 98.1°F | Ht 73.0 in | Wt 211.5 lb

## 2020-03-26 DIAGNOSIS — I10 Essential (primary) hypertension: Secondary | ICD-10-CM | POA: Diagnosis not present

## 2020-03-26 DIAGNOSIS — M7541 Impingement syndrome of right shoulder: Secondary | ICD-10-CM | POA: Diagnosis not present

## 2020-03-26 NOTE — Progress Notes (Signed)
  Subjective:  HPI: Antonio Ramos is a 59 y.o. male who presents for right shoulder pain  Please see Assessment and Plan below for the status of his chronic medical problems.  Objective:  Physical Exam: Vitals:   03/26/20 1037  BP: (!) 149/102  Pulse: 78  Temp: 98.1 F (36.7 C)  TempSrc: Oral  SpO2: 95%  Weight: 211 lb 8 oz (95.9 kg)  Height: 6\' 1"  (1.854 m)   Body mass index is 27.9 kg/m. Physical Exam Vitals and nursing note reviewed.  Constitutional:      Appearance: Normal appearance. He is not ill-appearing.  Cardiovascular:     Rate and Rhythm: Normal rate and regular rhythm.  Musculoskeletal:     Comments: Full ROM of left and right shoulder. Tenderness of right shoulder at acromion.  Neers + right.  Neurological:     Mental Status: He is alert.    Assessment & Plan:  See Encounters Tab for problem based charting.  Medications Ordered No orders of the defined types were placed in this encounter.  Other Orders No orders of the defined types were placed in this encounter.  Follow Up: Return if symptoms worsen or fail to improve.  PROCEDURE NOTE  PROCEDURE: right shoulder joint steroid injection.  PREOPERATIVE DIAGNOSIS: Bursitis of the right shoulder.  POSTOPERATIVE DIAGNOSIS: Bursitis of the right shoulder.  PROCEDURE: The patient was apprised of the risks and the benefits of the procedure and informed consent was obtained. Time-out procedure was performed, with confirmation of the patient's name, date of birth, and correct identification of the right shoulder to be injected. The patient's shoulder was then marked at the appropriate site for injection placement. The shoulder was sterilely prepped with Betadine. A 40 mg (1 milliliter) solution of Kenalog was drawn up into a 3 mL syringe with a 2 mL of 1% lidocaine. The patient was injected with a 25 gauge needle at the lateral  aspect of his  right shoulder. There were no complications. The patient tolerated  the procedure well. There was minimal bleeding. The patient was instructed to ice her shoulder upon leaving clinic and refrain from overuse over the next 3 days. The patient was instructed to go to the emergency room with any usual pain, swelling, or redness occurred in the injected area. The patient was given a followup appointment to evaluate response to the injection to his increased range of motion and reduction of pain.

## 2020-03-26 NOTE — Assessment & Plan Note (Signed)
HPI: Has a chronic right rotator cuff tear, functionally he reports that he is able to do everything he needs to with his right shoulder.  However he is particularly bothered at night laying on his right side and notes that it is difficult to sleep at times.  He is requesting a steroid injection today to provide relief for the holidays.  Assessment right shoulder impingement syndrome secondary to chronic rotator cuff tear  Plan Steroid injection today.  Discussed with him that he may need to follow-up with orthopedic surgery in the future.

## 2020-03-26 NOTE — Assessment & Plan Note (Signed)
HPI: He reports that after he started taking the new blood pressure medication 2 days later he felt dizzy at work and thus stopped his medication.  He brings medication bottles today he has olmesartan hydrochlorothiazide lisinopril hydrochlorothiazide and rosuvastatin.  In talking over with him when he started the olmesartan hydrochlorothiazide he never stopped the lisinopril hydrochlorothiazide.  Assessment essential hypertension not at goal  Plan Discussed with him in detail that I want him to take just the olmesartan hydrochlorothiazide and not lisinopril hydrochlorothiazide as his blood pressure is not at goal on this medication alone.

## 2020-06-03 ENCOUNTER — Telehealth: Payer: Self-pay

## 2020-06-03 NOTE — Telephone Encounter (Signed)
OK thanks, Antonio Ramos is usually pretty quick to see.  If he doesn't think he can wait till the 10th we can always overbook him on the 3rd to see me

## 2020-06-03 NOTE — Telephone Encounter (Signed)
This RN was informed by Lela that patient informed her he had a shoulder injury and needed an appt. TC to patient, he states he injured his left shoulder approx 2 weeks ago, fell while walking a dog.  Pt states "he heard his shoulder pop back into place after falling".  He c/o of left shoulder soreness at this time.  Pt only wants to see his PCP.  Appt given for 06/13/20 @ 0945 with Dr. Mikey Bussing. SChaplin, RN,BSN

## 2020-06-07 ENCOUNTER — Other Ambulatory Visit: Payer: Self-pay | Admitting: Family Medicine

## 2020-06-07 MED ORDER — METRONIDAZOLE 500 MG PO TABS: 500.0000 mg | ORAL_TABLET | Freq: Two times a day (BID) | ORAL | 0 refills | Status: DC

## 2020-06-13 ENCOUNTER — Other Ambulatory Visit (HOSPITAL_COMMUNITY)
Admission: RE | Admit: 2020-06-13 | Discharge: 2020-06-13 | Disposition: A | Discharge status: Home / Self Care | Payer: BC Managed Care – PPO | Source: Ambulatory Visit | Attending: Internal Medicine | Admitting: Internal Medicine

## 2020-06-13 ENCOUNTER — Ambulatory Visit (INDEPENDENT_AMBULATORY_CARE_PROVIDER_SITE_OTHER): Payer: BC Managed Care – PPO | Admitting: Internal Medicine

## 2020-06-13 ENCOUNTER — Other Ambulatory Visit: Payer: Self-pay

## 2020-06-13 ENCOUNTER — Encounter: Payer: Self-pay | Admitting: Internal Medicine

## 2020-06-13 VITALS — BP 177/105 | HR 84 | Temp 98.0°F | Ht 73.0 in | Wt 221.0 lb

## 2020-06-13 DIAGNOSIS — Z9189 Other specified personal risk factors, not elsewhere classified: Secondary | ICD-10-CM

## 2020-06-13 DIAGNOSIS — Z7251 High risk heterosexual behavior: Secondary | ICD-10-CM | POA: Insufficient documentation

## 2020-06-13 DIAGNOSIS — G720 Drug-induced myopathy: Secondary | ICD-10-CM | POA: Diagnosis not present

## 2020-06-13 DIAGNOSIS — I10 Essential (primary) hypertension: Secondary | ICD-10-CM | POA: Diagnosis not present

## 2020-06-13 DIAGNOSIS — M25512 Pain in left shoulder: Secondary | ICD-10-CM

## 2020-06-13 MED ORDER — OLMESARTAN MEDOXOMIL-HCTZ 20-12.5 MG PO TABS
1.0000 | ORAL_TABLET | Freq: Every day | ORAL | 3 refills | Status: DC
Start: 2020-06-13 — End: 2021-01-23

## 2020-06-13 MED ORDER — ROSUVASTATIN CALCIUM 10 MG PO TABS: 10.0000 mg | ORAL_TABLET | Freq: Every day | ORAL | 3 refills | Status: DC

## 2020-06-13 NOTE — Assessment & Plan Note (Signed)
HPI: His wife was diagnosed with trichomonas he received a prescription for metronidazole by her doctor.  He is currently taking it and using condoms for sexual activity.  He is asymptomatic.  Assessment high risk sexual behavior, possible trichomoniasis  Plan Complete STD testing with urine GC, trichomoniasis, HIV

## 2020-06-13 NOTE — Assessment & Plan Note (Signed)
HPI: Reports feeling dizzy after taking new blood pressure medication (olmesartan hydrochlorothiazide 40-25) so he stopped taking it he is not taking it over a month.  No headaches blurry vision.  Assessment essential hypertension uncontrolled  Plan Restart olmesartan hydrochlorothiazide at half previous dose 20-12.5 daily.

## 2020-06-13 NOTE — Progress Notes (Signed)
  Subjective:  HPI: Antonio Ramos is a 60 y.o. male who presents for left shoulder pain, HTN  Please see Assessment and Plan below for the status of his chronic medical problems.  Objective:  Physical Exam: Vitals:   06/13/20 0852  BP: (!) 177/105  Pulse: 84  Temp: 98 F (36.7 C)  TempSrc: Oral  SpO2: 97%  Weight: 221 lb (100.2 kg)  Height: 6\' 1"  (1.854 m)   Body mass index is 29.16 kg/m. Physical Exam Musculoskeletal:     Right shoulder: Normal. No swelling or deformity. Normal range of motion.     Left shoulder: Tenderness present. No swelling, deformity, effusion, bony tenderness or crepitus. Decreased range of motion.     Comments: + neers on left    Assessment & Plan:  See Encounters Tab for problem based charting.  Medications Ordered Meds ordered this encounter  Medications  . olmesartan-hydrochlorothiazide (BENICAR HCT) 20-12.5 MG tablet    Sig: Take 1 tablet by mouth daily.    Dispense:  90 tablet    Refill:  3  . rosuvastatin (CRESTOR) 10 MG tablet    Sig: Take 1 tablet (10 mg total) by mouth daily.    Dispense:  90 tablet    Refill:  3   Other Orders Orders Placed This Encounter  Procedures  . DG Shoulder Left    Standing Status:   Future    Standing Expiration Date:   06/13/2021    Order Specific Question:   Reason for Exam (SYMPTOM  OR DIAGNOSIS REQUIRED)    Answer:   pain, fell walking dog 3 weeks ago    Order Specific Question:   Preferred imaging location?    Answer:   [facility]  . Lipid Profile  . HIV antibody (with reflex)  . CMP14 + Anion Gap  . CK, total   Follow Up: Return in about 2 months (around 08/13/2020).   PROCEDURE NOTE  PROCEDURE: left shoulder joint steroid injection.  PREOPERATIVE DIAGNOSIS: Bursitis of the left shoulder.  POSTOPERATIVE DIAGNOSIS: Bursitis of the left shoulder.  PROCEDURE: The patient was apprised of the risks and the benefits of the procedure and informed consent was obtained. Time-out  procedure was performed, with confirmation of the patient's name, date of birth, and correct identification of the left shoulder to be injected. The patient's shoulder was then marked at the appropriate site for injection placement. The shoulder was sterilely prepped with Betadine. A 40 mg (1 milliliter) solution of Kenalog was drawn up into a 3 mL syringe with a 2 mL of 1% lidocaine. The patient was injected with a 25 gauge needle at the lateral  aspect of his  left shoulder. There were no complications. The patient tolerated the procedure well. There was minimal bleeding. The patient was instructed to ice her shoulder upon leaving clinic and refrain from overuse over the next 3 days. The patient was instructed to go to the emergency room with any usual pain, swelling, or redness occurred in the injected area. The patient was given a followup appointment to evaluate response to the injection to his increased range of motion and reduction of pain.

## 2020-06-13 NOTE — Assessment & Plan Note (Signed)
Now tolerating Crestor 10 mg daily without myalgias.  Will recheck lipid panel.

## 2020-06-13 NOTE — Assessment & Plan Note (Signed)
HPI: He reports he was walking his dog 3 weeks ago when it pulled him and he fell onto his left shoulder he had acute pain.  Had felt a pop and his wife popped his shoulder back into place.  Since this time he is continued to have pain he has been using Voltaren gel in the area which is helped some.  He does have a painful arc when raising his arm above his head.  No bony tenderness on exam exam suggestive of impingement.  Assessment acute pain of left shoulder suspect impingement syndrome  Plan Given trauma will obtain x-ray Offered and given steroid injection today.

## 2020-06-13 NOTE — Assessment & Plan Note (Signed)
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  However I suspect that the shoulder pains are actually due to impingement and his mildly elevated CK was due to his large muscle mass.  CK has been in 500-600 range off of statins and we restarted Crestor 10 mg daily back in November.  He reports he has been taking this he denies any myalgias.  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

## 2020-06-14 LAB — CMP14 + ANION GAP
ALT: 26 IU/L (ref 0–44)
AST: 25 IU/L (ref 0–40)
Albumin/Globulin Ratio: 1.7 (ref 1.2–2.2)
Albumin: 4.5 g/dL (ref 3.8–4.8)
Alkaline Phosphatase: 89 IU/L (ref 44–121)
Anion Gap: 15 mmol/L (ref 10.0–18.0)
BUN/Creatinine Ratio: 14 (ref 10–24)
BUN: 16 mg/dL (ref 8–27)
Bilirubin Total: <0.2 mg/dL (ref 0.0–1.2)
CO2: 20 mmol/L (ref 20–29)
Calcium: 9.3 mg/dL (ref 8.6–10.2)
Chloride: 105 mmol/L (ref 96–106)
Creatinine, Ser: 1.14 mg/dL (ref 0.76–1.27)
Globulin, Total: 2.6 g/dL (ref 1.5–4.5)
Glucose: 102 mg/dL — ABNORMAL HIGH (ref 65–99)
Potassium: 4.5 mmol/L (ref 3.5–5.2)
Sodium: 140 mmol/L (ref 134–144)
Total Protein: 7.1 g/dL (ref 6.0–8.5)
eGFR: 73 mL/min/{1.73_m2} (ref >59)

## 2020-06-14 LAB — URINE CYTOLOGY ANCILLARY ONLY
Chlamydia: NEGATIVE
Comment: NEGATIVE
Comment: NEGATIVE
Comment: NORMAL
Neisseria Gonorrhea: NEGATIVE
Trichomonas: NEGATIVE

## 2020-06-14 LAB — LIPID PANEL
Chol/HDL Ratio: 4 ratio (ref 0.0–5.0)
Cholesterol, Total: 199 mg/dL (ref 100–199)
HDL: 50 mg/dL (ref >39)
LDL Chol Calc (NIH): 131 mg/dL — ABNORMAL HIGH (ref 0–99)
Triglycerides: 101 mg/dL (ref 0–149)
VLDL Cholesterol Cal: 18 mg/dL (ref 5–40)

## 2020-06-14 LAB — HIV ANTIBODY (ROUTINE TESTING W REFLEX): HIV Screen 4th Generation wRfx: NONREACTIVE

## 2020-06-14 LAB — CK: Total CK: 406 U/L — ABNORMAL HIGH (ref 41–331)

## 2021-01-23 ENCOUNTER — Encounter: Payer: Self-pay | Admitting: Internal Medicine

## 2021-01-23 ENCOUNTER — Other Ambulatory Visit: Payer: Self-pay

## 2021-01-23 ENCOUNTER — Ambulatory Visit: Payer: BC Managed Care – PPO | Admitting: Internal Medicine

## 2021-01-23 VITALS — BP 150/96 | HR 84 | Temp 98.0°F | Resp 20 | Ht 74.0 in | Wt 213.1 lb

## 2021-01-23 DIAGNOSIS — Z23 Encounter for immunization: Secondary | ICD-10-CM

## 2021-01-23 DIAGNOSIS — I10 Essential (primary) hypertension: Secondary | ICD-10-CM

## 2021-01-23 DIAGNOSIS — Z9189 Other specified personal risk factors, not elsewhere classified: Secondary | ICD-10-CM | POA: Diagnosis not present

## 2021-01-23 DIAGNOSIS — Z0000 Encounter for general adult medical examination without abnormal findings: Secondary | ICD-10-CM

## 2021-01-23 DIAGNOSIS — M25512 Pain in left shoulder: Secondary | ICD-10-CM | POA: Diagnosis not present

## 2021-01-23 MED ORDER — ROSUVASTATIN CALCIUM 10 MG PO TABS: 10.0000 mg | ORAL_TABLET | Freq: Every day | ORAL | 3 refills | Status: DC

## 2021-01-23 MED ORDER — OLMESARTAN MEDOXOMIL-HCTZ 40-12.5 MG PO TABS: 1.0000 | ORAL_TABLET | Freq: Every day | ORAL | 3 refills | Status: DC

## 2021-01-23 NOTE — Patient Instructions (Signed)
The gel for your shoulder is called Voltaren (or diclofenac) it is now over the counter.  I adjusted your blood pressure medication for the next time you fill our medication.

## 2021-01-23 NOTE — Progress Notes (Signed)
  Subjective:  HPI: Antonio Ramos is a 60 y.o. male who presents for follow-up of hypertension hyperlipidemia and left shoulder pain.  He reports overall he is doing well wants to get "checked out" to stay healthy.  He brings an old bottle of Crestor from last November 30-day prescription with about 10 pills left and says he needs a refill.  And inquiring more about this he says he has other bottles of Crestor which he has been taking.  Also reports taking his olmesartan prescription not having orthostatic symptoms.  He is having some continued left shoulder impingement symptoms.  But notes that Voltaren gel has been working well to control symptoms.  He is now retired.  Please see Assessment and Plan below for the status of his chronic medical problems.  Objective:  Physical Exam: Vitals:   01/23/21 0903  BP: (!) 150/96  Pulse: 84  Resp: 20  Temp: 98 F (36.7 C)  SpO2: 100%  Weight: 213 lb 1.6 oz (96.7 kg)  Height: 6\' 2"  (1.88 m)   Body mass index is 27.36 kg/m. Physical Exam Cardiovascular:     Rate and Rhythm: Normal rate and regular rhythm.  Pulmonary:     Effort: Pulmonary effort is normal.     Breath sounds: Normal breath sounds.  Abdominal:     General: Abdomen is flat. Bowel sounds are normal.  Musculoskeletal:     Right lower leg: No edema.     Left lower leg: No edema.     Comments: Left shoulder painful arc, neers +. Neg empty can.   Assessment & Plan:  See Encounters Tab for problem based charting.  Medications Ordered Meds ordered this encounter  Medications   rosuvastatin (CRESTOR) 10 MG tablet    Sig: Take 1 tablet (10 mg total) by mouth daily.    Dispense:  90 tablet    Refill:  3   olmesartan-hydrochlorothiazide (BENICAR HCT) 40-12.5 MG tablet    Sig: Take 1 tablet by mouth daily.    Dispense:  90 tablet    Refill:  3   Other Orders Orders Placed This Encounter  Procedures   Fecal occult blood, imunochemical    Standing Status:   Future     Standing Expiration Date:   01/23/2022   Flu Vaccine QUAD 37mo+IM (Fluarix, Fluzone & Alfiuria Quad PF)   Lipid Profile   BMP8+Anion Gap   CBC with Diff   Follow Up: Return in about 6 months (around 07/24/2021).

## 2021-01-24 LAB — CBC WITH DIFFERENTIAL/PLATELET
Basophils Absolute: 0 10*3/uL (ref 0.0–0.2)
Basos: 1 %
EOS (ABSOLUTE): 0.1 10*3/uL (ref 0.0–0.4)
Eos: 1 %
Hematocrit: 41.4 % (ref 37.5–51.0)
Hemoglobin: 13.7 g/dL (ref 13.0–17.7)
Immature Grans (Abs): 0 10*3/uL (ref 0.0–0.1)
Immature Granulocytes: 0 %
Lymphocytes Absolute: 1.7 10*3/uL (ref 0.7–3.1)
Lymphs: 23 %
MCH: 27.7 pg (ref 26.6–33.0)
MCHC: 33.1 g/dL (ref 31.5–35.7)
MCV: 84 fL (ref 79–97)
Monocytes Absolute: 0.4 10*3/uL (ref 0.1–0.9)
Monocytes: 5 %
Neutrophils Absolute: 5.1 10*3/uL (ref 1.4–7.0)
Neutrophils: 70 %
Platelets: 236 10*3/uL (ref 150–450)
RBC: 4.95 x10E6/uL (ref 4.14–5.80)
RDW: 14 % (ref 11.6–15.4)
WBC: 7.2 10*3/uL (ref 3.4–10.8)

## 2021-01-24 LAB — BMP8+ANION GAP
Anion Gap: 16 mmol/L (ref 10.0–18.0)
BUN/Creatinine Ratio: 14 (ref 10–24)
BUN: 15 mg/dL (ref 8–27)
CO2: 21 mmol/L (ref 20–29)
Calcium: 9.4 mg/dL (ref 8.6–10.2)
Chloride: 103 mmol/L (ref 96–106)
Creatinine, Ser: 1.06 mg/dL (ref 0.76–1.27)
Glucose: 127 mg/dL — ABNORMAL HIGH (ref 70–99)
Potassium: 4.2 mmol/L (ref 3.5–5.2)
Sodium: 140 mmol/L (ref 134–144)
eGFR: 79 mL/min/{1.73_m2} (ref >59)

## 2021-01-24 LAB — LIPID PANEL
Chol/HDL Ratio: 5.1 ratio — ABNORMAL HIGH (ref 0.0–5.0)
Cholesterol, Total: 230 mg/dL — ABNORMAL HIGH (ref 100–199)
HDL: 45 mg/dL (ref >39)
LDL Chol Calc (NIH): 159 mg/dL — ABNORMAL HIGH (ref 0–99)
Triglycerides: 145 mg/dL (ref 0–149)
VLDL Cholesterol Cal: 26 mg/dL (ref 5–40)

## 2021-01-24 NOTE — Assessment & Plan Note (Signed)
Suspect some mild shoulder impingement continue Voltaren gel as needed.

## 2021-01-24 NOTE — Assessment & Plan Note (Signed)
I am still concerned medication adherence is playing a big role in his uncontrolled blood pressure of 150/96 today.  However he does report taking it.  I am going to try to update his prescription to olmesartan 40 mg and continue HCTZ at 12.5 mg I have asked him to let me know if he develops orthostasis symptoms on this and we can adjust it rather than him stopping taking it altogether.

## 2021-01-24 NOTE — Assessment & Plan Note (Signed)
Reports no myalgias, I am concerned he may not be taking Crestor daily given the old pill bottle.

## 2021-01-27 ENCOUNTER — Other Ambulatory Visit: Payer: BC Managed Care – PPO

## 2021-01-27 DIAGNOSIS — Z0000 Encounter for general adult medical examination without abnormal findings: Secondary | ICD-10-CM

## 2021-01-29 ENCOUNTER — Encounter: Payer: Self-pay | Admitting: Internal Medicine

## 2021-01-29 LAB — FECAL OCCULT BLOOD, IMMUNOCHEMICAL: Fecal Occult Bld: NEGATIVE

## 2021-03-27 ENCOUNTER — Telehealth: Payer: Self-pay | Admitting: Internal Medicine

## 2021-03-27 MED ORDER — PAXLOVID (300/100) 20 X 150 MG & 10 X 100MG PO TBPK: ORAL_TABLET | ORAL | 0 refills | Status: DC

## 2021-03-27 NOTE — Telephone Encounter (Signed)
Started to have some nasal congestion, muscle aches and hot flashes on night of 20th called out of work on 21st and tested himself for COVID-19 this morning and postive.  He reports he has had 2 COVID vaccinations.  Advised to remain out of work for at least 5 days after symptoms onset (provided he is fever free for 24 hours).

## 2021-06-12 ENCOUNTER — Encounter: Payer: BC Managed Care – PPO | Admitting: Internal Medicine

## 2021-06-12 ENCOUNTER — Telehealth: Payer: Self-pay | Admitting: *Deleted

## 2021-06-12 NOTE — Telephone Encounter (Signed)
Called patient left voice message for patient regarding his appointment with Dr. Mikey Bussing: April 6.2023 @ 8:45am ?

## 2021-07-05 ENCOUNTER — Emergency Department (HOSPITAL_BASED_OUTPATIENT_CLINIC_OR_DEPARTMENT_OTHER): Payer: BC Managed Care – PPO

## 2021-07-05 ENCOUNTER — Inpatient Hospital Stay (HOSPITAL_BASED_OUTPATIENT_CLINIC_OR_DEPARTMENT_OTHER)
Admission: EM | Admit: 2021-07-05 | Discharge: 2021-07-07 | DRG: 280 | Disposition: A | Discharge status: Home / Self Care | Payer: BC Managed Care – PPO | Attending: Cardiology | Admitting: Cardiology

## 2021-07-05 ENCOUNTER — Other Ambulatory Visit: Payer: Self-pay

## 2021-07-05 ENCOUNTER — Encounter (HOSPITAL_BASED_OUTPATIENT_CLINIC_OR_DEPARTMENT_OTHER): Payer: Self-pay | Admitting: *Deleted

## 2021-07-05 DIAGNOSIS — I213 ST elevation (STEMI) myocardial infarction of unspecified site: Principal | ICD-10-CM | POA: Diagnosis present

## 2021-07-05 DIAGNOSIS — M79602 Pain in left arm: Secondary | ICD-10-CM | POA: Diagnosis present

## 2021-07-05 DIAGNOSIS — Z720 Tobacco use: Secondary | ICD-10-CM | POA: Diagnosis present

## 2021-07-05 DIAGNOSIS — Z79899 Other long term (current) drug therapy: Secondary | ICD-10-CM

## 2021-07-05 DIAGNOSIS — J449 Chronic obstructive pulmonary disease, unspecified: Secondary | ICD-10-CM | POA: Diagnosis present

## 2021-07-05 DIAGNOSIS — I214 Non-ST elevation (NSTEMI) myocardial infarction: Secondary | ICD-10-CM | POA: Diagnosis not present

## 2021-07-05 DIAGNOSIS — I43 Cardiomyopathy in diseases classified elsewhere: Secondary | ICD-10-CM | POA: Diagnosis present

## 2021-07-05 DIAGNOSIS — I5021 Acute systolic (congestive) heart failure: Secondary | ICD-10-CM | POA: Diagnosis not present

## 2021-07-05 DIAGNOSIS — I4891 Unspecified atrial fibrillation: Secondary | ICD-10-CM

## 2021-07-05 DIAGNOSIS — N179 Acute kidney failure, unspecified: Secondary | ICD-10-CM | POA: Diagnosis present

## 2021-07-05 DIAGNOSIS — F17210 Nicotine dependence, cigarettes, uncomplicated: Secondary | ICD-10-CM | POA: Diagnosis present

## 2021-07-05 DIAGNOSIS — I110 Hypertensive heart disease with heart failure: Secondary | ICD-10-CM | POA: Diagnosis present

## 2021-07-05 DIAGNOSIS — E785 Hyperlipidemia, unspecified: Secondary | ICD-10-CM | POA: Diagnosis present

## 2021-07-05 DIAGNOSIS — I252 Old myocardial infarction: Secondary | ICD-10-CM

## 2021-07-05 DIAGNOSIS — Z8249 Family history of ischemic heart disease and other diseases of the circulatory system: Secondary | ICD-10-CM

## 2021-07-05 DIAGNOSIS — I10 Essential (primary) hypertension: Secondary | ICD-10-CM | POA: Diagnosis present

## 2021-07-05 DIAGNOSIS — I480 Paroxysmal atrial fibrillation: Secondary | ICD-10-CM | POA: Diagnosis not present

## 2021-07-05 DIAGNOSIS — I428 Other cardiomyopathies: Secondary | ICD-10-CM | POA: Diagnosis present

## 2021-07-05 HISTORY — DX: Unspecified atrial fibrillation: I48.91

## 2021-07-05 LAB — BASIC METABOLIC PANEL
Anion gap: 9 (ref 5–15)
BUN: 24 mg/dL — ABNORMAL HIGH (ref 8–23)
CO2: 26 mmol/L (ref 22–32)
Calcium: 9.4 mg/dL (ref 8.9–10.3)
Chloride: 103 mmol/L (ref 98–111)
Creatinine, Ser: 1.44 mg/dL — ABNORMAL HIGH (ref 0.61–1.24)
GFR, Estimated: 55 mL/min — ABNORMAL LOW (ref >60)
Glucose, Bld: 119 mg/dL — ABNORMAL HIGH (ref 70–99)
Potassium: 4.1 mmol/L (ref 3.5–5.1)
Sodium: 138 mmol/L (ref 135–145)

## 2021-07-05 LAB — CBC
HCT: 42 % (ref 39.0–52.0)
Hemoglobin: 13.9 g/dL (ref 13.0–17.0)
MCH: 27.5 pg (ref 26.0–34.0)
MCHC: 33.1 g/dL (ref 30.0–36.0)
MCV: 83 fL (ref 80.0–100.0)
Platelets: 223 10*3/uL (ref 150–400)
RBC: 5.06 MIL/uL (ref 4.22–5.81)
RDW: 15 % (ref 11.5–15.5)
WBC: 8.7 10*3/uL (ref 4.0–10.5)
nRBC: 0 % (ref 0.0–0.2)

## 2021-07-05 LAB — TROPONIN I (HIGH SENSITIVITY): Troponin I (High Sensitivity): 903 ng/L (ref <18)

## 2021-07-05 LAB — MAGNESIUM: Magnesium: 2.1 mg/dL (ref 1.7–2.4)

## 2021-07-05 MED ORDER — HEPARIN (PORCINE) 25000 UT/250ML-% IV SOLN
1450.0000 [IU]/h | INTRAVENOUS | Status: AC
  Administered 2021-07-05: 1150 [IU]/h via INTRAVENOUS
  Filled 2021-07-05 (×2): qty 250

## 2021-07-05 MED ORDER — DILTIAZEM LOAD VIA INFUSION
15.0000 mg | Freq: Once | INTRAVENOUS | Status: AC
  Administered 2021-07-05: 15 mg via INTRAVENOUS
  Filled 2021-07-05: qty 15

## 2021-07-05 MED ORDER — DILTIAZEM HCL-DEXTROSE 125-5 MG/125ML-% IV SOLN (PREMIX)
5.0000 mg/h | INTRAVENOUS | Status: DC
  Administered 2021-07-05: 5 mg/h via INTRAVENOUS
  Filled 2021-07-05: qty 125

## 2021-07-05 MED ORDER — HEPARIN BOLUS VIA INFUSION
4000.0000 [IU] | Freq: Once | INTRAVENOUS | Status: AC
  Administered 2021-07-05: 4000 [IU] via INTRAVENOUS

## 2021-07-05 MED ORDER — ASPIRIN 325 MG PO TABS
325.0000 mg | ORAL_TABLET | Freq: Every day | ORAL | Status: DC
  Filled 2021-07-05 (×2): qty 1

## 2021-07-05 NOTE — ED Provider Notes (Signed)
?[facility] EMERGENCY DEPT ?Provider Note ? ? ?CSN: [id number] ?Arrival date & time: 07/05/21  2029 ? ?  ? ?History ? ?Chief Complaint  ?Patient presents with  ? Chest Pain  ? ? ?Antonio Ramos is a 61 y.o. male. ? ?Patient presents to ER chief complaint of chest pain, lightheadedness.  Symptoms ongoing and started 3 days ago.  Did not improve so he presents to the ER today.  He states he had a MI in 1970, however denies any stent placement.  Denies any fevers or cough.  He had an episode of vomiting 2 days ago and states that helped the pain but he had recurrence again today and presents back to the ER. ? ? ?  ? ?Home Medications ?Prior to Admission medications   ?Medication Sig Start Date End Date Taking? Authorizing Provider  ?albuterol (PROVENTIL HFA;VENTOLIN HFA) 108 (90 BASE) MCG/ACT inhaler Inhale 1-2 puffs into the lungs every 6 (six) hours as needed for wheezing or shortness of breath. ?Patient not taking: Reported on 12/17/2014 02/04/14   Blane Ohara, MD  ?diclofenac sodium (VOLTAREN) 1 % GEL Apply 2 g topically 4 (four) times daily. 04/09/16   Gust Rung, DO  ?metroNIDAZOLE (FLAGYL) 500 MG tablet Take 1 tablet (500 mg total) by mouth 2 (two) times daily. 06/07/20   Dollene Cleveland, DO  ?nirmatrelvir & ritonavir (PAXLOVID, 300/100,) 20 x 150 MG & 10 x 100MG  TBPK Take 300mg  nirmatrelvir and 100mg  ritonavir twice daily for 5 days. 03/27/21   , DO  ?olmesartan-hydrochlorothiazide (BENICAR HCT) 40-12.5 MG tablet Take 1 tablet by mouth daily. 01/23/21   03/29/21, DO  ?rosuvastatin (CRESTOR) 10 MG tablet Take 1 tablet (10 mg total) by mouth daily. 01/23/21 01/23/22  Gust Rung, DO  ?   ? ?Allergies    ?Patient has no known allergies.   ? ?Review of Systems   ?Review of Systems  ?Constitutional:  Negative for fever.  ?HENT:  Negative for ear pain and sore throat.   ?Eyes:  Negative for pain.  ?Respiratory:  Negative for cough.   ?Cardiovascular:  Positive for chest pain.   ?Gastrointestinal:  Negative for abdominal pain.  ?Genitourinary:  Negative for flank pain.  ?Musculoskeletal:  Negative for back pain.  ?Skin:  Negative for color change and rash.  ?Neurological:  Negative for syncope.  ?All other systems reviewed and are negative. ? ?Physical Exam ?Updated Vital Signs ?BP 116/86   Pulse 72   Temp 98.8 ?F (37.1 ?C) (Oral)   Resp 18   Wt 96.6 kg   SpO2 95%   BMI 27.35 kg/m?  ?Physical Exam ?Constitutional:   ?   Appearance: He is well-developed.  ?HENT:  ?   Head: Normocephalic.  ?   Nose: Nose normal.  ?Eyes:  ?   Extraocular Movements: Extraocular movements intact.  ?Cardiovascular:  ?   Rate and Rhythm: Tachycardia present. Rhythm irregular.  ?Pulmonary:  ?   Effort: Pulmonary effort is normal.  ?Skin: ?   Coloration: Skin is not jaundiced.  ?Neurological:  ?   Mental Status: He is alert. Mental status is at baseline.  ? ? ?ED Results / Procedures / Treatments   ?Labs ?(all labs ordered are listed, but only abnormal results are displayed) ?Labs Reviewed  ?BASIC METABOLIC PANEL - Abnormal; Notable for the following components:  ?    Result Value  ? Glucose, Bld 119 (*)   ? BUN 24 (*)   ? Creatinine, Ser 1.44 (*)   ?  GFR, Estimated 55 (*)   ? All other components within normal limits  ?TROPONIN I (HIGH SENSITIVITY) - Abnormal; Notable for the following components:  ? Troponin I (High Sensitivity) 903 (*)   ? All other components within normal limits  ?CBC  ?MAGNESIUM  ?TROPONIN I (HIGH SENSITIVITY)  ? ? ?EKG ?None ? ?Radiology ?DG Chest Port 1 View ? ?Result Date: 07/05/2021 ?CLINICAL DATA:  Dyspnea EXAM: PORTABLE CHEST 1 VIEW COMPARISON:  10/02/2015 FINDINGS: The heart size and mediastinal contours are within normal limits. Both lungs are clear. The visualized skeletal structures are unremarkable. IMPRESSION: No active disease. Electronically Signed   By: Helyn Numbers M.D.   On: 07/05/2021 21:47   ? ?Procedures ?Marland KitchenCritical Care ?Performed by: Cheryll Cockayne, MD ?Authorized  by: Cheryll Cockayne, MD  ? ?Critical care provider statement:  ?  Critical care time (minutes):  40 ?  Critical care time was exclusive of:  Separately billable procedures and treating other patients and teaching time ?  Critical care was necessary to treat or prevent imminent or life-threatening deterioration of the following conditions:  Cardiac failure  ? ? ?Medications Ordered in ED ?Medications  ?diltiazem (CARDIZEM) 1 mg/mL load via infusion 15 mg (15 mg Intravenous Bolus from Bag 07/05/21 2145)  ?  And  ?diltiazem (CARDIZEM) 125 mg in dextrose 5% 125 mL (1 mg/mL) infusion (5 mg/hr Intravenous New Bag/Given 07/05/21 2144)  ?heparin ADULT infusion 100 units/mL (25000 units/217mL) (1,150 Units/hr Intravenous New Bag/Given 07/05/21 2147)  ?aspirin tablet 325 mg (has no administration in time range)  ?heparin bolus via infusion 4,000 Units (4,000 Units Intravenous Bolus from Bag 07/05/21 2147)  ? ? ?ED Course/ Medical Decision Making/ A&P ?  ?                        ?Medical Decision Making ?Amount and/or Complexity of Data Reviewed ?Labs: ordered. ?Radiology: ordered. ? ?Risk ?OTC drugs. ?Prescription drug management. ? ? ?Chart review shows outpatient visit for preventive health October 2022. ? ?History obtained from the patient and his caregiver at bedside. ? ?Cardiac monitoring shows atrial fibrillation with rapid rate around 150s to 170 bpm. ? ?Patient given IV diltiazem with significant improvement of heart rates about 70 to 90 bpm. ? ?Troponin has returned greater than 900.  Heparin started IV.  Given aspirin 324 mg orally.  Currently chest pain is much improved only low-level chest discomfort present now. ? ?Cardiology consultation requested. ? ? ? ? ? ? ? ?Final Clinical Impression(s) / ED Diagnoses ?Final diagnoses:  ?Atrial fibrillation with rapid ventricular response (HCC)  ?Non-ST elevation MI (NSTEMI) (HCC)  ? ? ?Rx / DC Orders ?ED Discharge Orders   ? ? None  ? ?  ? ? ?  ?Cheryll Cockayne, MD ?07/05/21  2226 ? ?

## 2021-07-05 NOTE — ED Notes (Signed)
Cardiac monitor reads rate at 142bpm, palpated pulse is 74bpm and irregular. Pt A&Ox4, epigastric 7/10 pain.  ?

## 2021-07-05 NOTE — Progress Notes (Signed)
ANTICOAGULATION CONSULT NOTE - Initial Consult ? ?Pharmacy Consult for heparin ?Indication: chest pain/ACS ? ?No Known Allergies ? ?Patient Measurements: ?Weight: 96.6 kg (213 lb) ?Heparin Dosing Weight: TBW ? ?Vital Signs: ?Temp: 98.8 ?F (37.1 ?C) (04/01 2045) ?Temp Source: Oral (04/01 2045) ?BP: 100/78 (04/01 2135) ?Pulse Rate: 172 (04/01 2135) ? ?Labs: ?Recent Labs  ?  07/05/21 ?2053  ?HGB 13.9  ?HCT 42.0  ?PLT 223  ?CREATININE 1.44*  ?TROPONINIHS 903*  ? ? ?Estimated Creatinine Clearance: 61 mL/min (A) (by C-G formula based on SCr of 1.44 mg/dL (H)). ? ? ?Medical History: ?Past Medical History:  ?Diagnosis Date  ? Blood transfusion without reported diagnosis   ? Hemorrhagic shock (HCC) 11/23/2012  ? Hypertension   ? MI (myocardial infarction) (HCC)   ? per patient report, unconfimred  ? PUD (peptic ulcer disease)   ? Diagnosed in 1986, with 3 prior bleeds since that time, most recently in 2004  ? ? ?Assessment: ?22 YOM presenting with SOB, nausea and epigastric pain, elevated troponin.  He is not on anticoagulation PTA, CBC wnl.   ? ?Goal of Therapy:  ?Heparin level 0.3-0.7 units/ml ?Monitor platelets by anticoagulation protocol: Yes ?  ?Plan:  ?Heparin 4000 units IV x 1, and gtt at 1150 units/hr ?F/u 6 hour heparin level ?F/u cards eval and recs ? ?Daylene Posey, PharmD ?Clinical Pharmacist ?ED Pharmacist Phone # [phone] ?07/05/2021 9:42 PM ? ? ?

## 2021-07-05 NOTE — ED Triage Notes (Signed)
Pt is here for sob with nausea and epigastric pain.  Pt reports that the sob increases with exertion and he also reports breaking out in sweats.   ?

## 2021-07-05 NOTE — ED Notes (Signed)
Dr. Audley Hose aware of the troponin of 903.  ?

## 2021-07-06 ENCOUNTER — Inpatient Hospital Stay (HOSPITAL_COMMUNITY): Payer: BC Managed Care – PPO

## 2021-07-06 DIAGNOSIS — M79602 Pain in left arm: Secondary | ICD-10-CM | POA: Diagnosis present

## 2021-07-06 DIAGNOSIS — F17210 Nicotine dependence, cigarettes, uncomplicated: Secondary | ICD-10-CM | POA: Diagnosis present

## 2021-07-06 DIAGNOSIS — E785 Hyperlipidemia, unspecified: Secondary | ICD-10-CM | POA: Diagnosis present

## 2021-07-06 DIAGNOSIS — I428 Other cardiomyopathies: Secondary | ICD-10-CM | POA: Diagnosis present

## 2021-07-06 DIAGNOSIS — J449 Chronic obstructive pulmonary disease, unspecified: Secondary | ICD-10-CM | POA: Diagnosis present

## 2021-07-06 DIAGNOSIS — N179 Acute kidney failure, unspecified: Secondary | ICD-10-CM | POA: Diagnosis present

## 2021-07-06 DIAGNOSIS — Z79899 Other long term (current) drug therapy: Secondary | ICD-10-CM | POA: Diagnosis not present

## 2021-07-06 DIAGNOSIS — I213 ST elevation (STEMI) myocardial infarction of unspecified site: Secondary | ICD-10-CM | POA: Diagnosis present

## 2021-07-06 DIAGNOSIS — I252 Old myocardial infarction: Secondary | ICD-10-CM | POA: Diagnosis not present

## 2021-07-06 DIAGNOSIS — Z8249 Family history of ischemic heart disease and other diseases of the circulatory system: Secondary | ICD-10-CM | POA: Diagnosis not present

## 2021-07-06 DIAGNOSIS — I214 Non-ST elevation (NSTEMI) myocardial infarction: Secondary | ICD-10-CM | POA: Diagnosis present

## 2021-07-06 DIAGNOSIS — I480 Paroxysmal atrial fibrillation: Secondary | ICD-10-CM | POA: Diagnosis not present

## 2021-07-06 DIAGNOSIS — I110 Hypertensive heart disease with heart failure: Secondary | ICD-10-CM | POA: Diagnosis present

## 2021-07-06 DIAGNOSIS — I5021 Acute systolic (congestive) heart failure: Secondary | ICD-10-CM | POA: Diagnosis not present

## 2021-07-06 DIAGNOSIS — I43 Cardiomyopathy in diseases classified elsewhere: Secondary | ICD-10-CM | POA: Diagnosis present

## 2021-07-06 LAB — CBC
HCT: 40.8 % (ref 39.0–52.0)
Hemoglobin: 13.4 g/dL (ref 13.0–17.0)
MCH: 27.5 pg (ref 26.0–34.0)
MCHC: 32.8 g/dL (ref 30.0–36.0)
MCV: 83.8 fL (ref 80.0–100.0)
Platelets: 204 10*3/uL (ref 150–400)
RBC: 4.87 MIL/uL (ref 4.22–5.81)
RDW: 14.9 % (ref 11.5–15.5)
WBC: 6.8 10*3/uL (ref 4.0–10.5)
nRBC: 0 % (ref 0.0–0.2)

## 2021-07-06 LAB — ECHOCARDIOGRAM COMPLETE
AR max vel: 3.11 cm2
AV Area VTI: 2.98 cm2
AV Area mean vel: 2.86 cm2
AV Mean grad: 2 mmHg
AV Peak grad: 4.4 mmHg
Ao pk vel: 1.05 m/s
Area-P 1/2: 3.91 cm2
Calc EF: 46.8 %
Height: 74 in
MV M vel: 5.29 m/s
MV Peak grad: 111.9 mmHg
MV VTI: 1.88 cm2
Radius: 0.6 cm
S' Lateral: 3.2 cm
Single Plane A2C EF: 45.2 %
Single Plane A4C EF: 48 %
Weight: 3368.63 oz

## 2021-07-06 LAB — LIPID PANEL
Cholesterol: 185 mg/dL (ref 0–200)
HDL: 36 mg/dL — ABNORMAL LOW (ref >40)
LDL Cholesterol: 129 mg/dL — ABNORMAL HIGH (ref 0–99)
Total CHOL/HDL Ratio: 5.1 RATIO
Triglycerides: 100 mg/dL (ref <150)
VLDL: 20 mg/dL (ref 0–40)

## 2021-07-06 LAB — TSH: TSH: 4.442 u[IU]/mL (ref 0.350–4.500)

## 2021-07-06 LAB — HEPARIN LEVEL (UNFRACTIONATED)
Heparin Unfractionated: 0.13 IU/mL — ABNORMAL LOW (ref 0.30–0.70)
Heparin Unfractionated: 0.32 IU/mL (ref 0.30–0.70)

## 2021-07-06 LAB — HIV ANTIBODY (ROUTINE TESTING W REFLEX): HIV Screen 4th Generation wRfx: NONREACTIVE

## 2021-07-06 LAB — MRSA NEXT GEN BY PCR, NASAL: MRSA by PCR Next Gen: NOT DETECTED

## 2021-07-06 LAB — TROPONIN I (HIGH SENSITIVITY): Troponin I (High Sensitivity): 937 ng/L (ref <18)

## 2021-07-06 MED ORDER — DICLOFENAC SODIUM 1 % EX GEL: 2.0000 g | Freq: Four times a day (QID) | CUTANEOUS | Status: DC | PRN

## 2021-07-06 MED ORDER — ACETAMINOPHEN 325 MG PO TABS: 650.0000 mg | ORAL_TABLET | ORAL | Status: DC | PRN

## 2021-07-06 MED ORDER — ONDANSETRON HCL 4 MG/2ML IJ SOLN: 4.0000 mg | Freq: Four times a day (QID) | INTRAMUSCULAR | Status: DC | PRN

## 2021-07-06 MED ORDER — HYDROCHLOROTHIAZIDE 12.5 MG PO TABS
12.5000 mg | ORAL_TABLET | Freq: Every day | ORAL | Status: DC
  Administered 2021-07-06 – 2021-07-07 (×2): 12.5 mg via ORAL
  Filled 2021-07-06 (×2): qty 1

## 2021-07-06 MED ORDER — HEPARIN BOLUS VIA INFUSION
2500.0000 [IU] | Freq: Once | INTRAVENOUS | Status: AC
Start: 2021-07-06 — End: 2021-07-06
  Administered 2021-07-06: 2500 [IU] via INTRAVENOUS
  Filled 2021-07-06: qty 2500

## 2021-07-06 MED ORDER — IRBESARTAN 150 MG PO TABS
300.0000 mg | ORAL_TABLET | Freq: Every day | ORAL | Status: DC
  Administered 2021-07-06 – 2021-07-07 (×2): 300 mg via ORAL
  Filled 2021-07-06 (×2): qty 2

## 2021-07-06 MED ORDER — ALBUTEROL SULFATE (2.5 MG/3ML) 0.083% IN NEBU: 2.5000 mg | INHALATION_SOLUTION | Freq: Four times a day (QID) | RESPIRATORY_TRACT | Status: DC | PRN

## 2021-07-06 MED ORDER — OLMESARTAN MEDOXOMIL-HCTZ 40-12.5 MG PO TABS: 1.0000 | ORAL_TABLET | Freq: Every day | ORAL | Status: DC

## 2021-07-06 MED ORDER — DILTIAZEM HCL 60 MG PO TABS
30.0000 mg | ORAL_TABLET | Freq: Four times a day (QID) | ORAL | Status: DC
  Administered 2021-07-06 (×2): 30 mg via ORAL
  Filled 2021-07-06 (×2): qty 1

## 2021-07-06 MED ORDER — ENOXAPARIN SODIUM 100 MG/ML IJ SOSY
100.0000 mg | PREFILLED_SYRINGE | Freq: Two times a day (BID) | INTRAMUSCULAR | Status: DC
  Administered 2021-07-06 – 2021-07-07 (×2): 100 mg via SUBCUTANEOUS
  Filled 2021-07-06 (×3): qty 1

## 2021-07-06 MED ORDER — METOPROLOL SUCCINATE ER 50 MG PO TB24
50.0000 mg | ORAL_TABLET | Freq: Every day | ORAL | Status: DC
  Administered 2021-07-06 – 2021-07-07 (×2): 50 mg via ORAL
  Filled 2021-07-06 (×2): qty 1

## 2021-07-06 MED ORDER — ROSUVASTATIN CALCIUM 5 MG PO TABS
10.0000 mg | ORAL_TABLET | Freq: Every day | ORAL | Status: DC
  Administered 2021-07-06 – 2021-07-07 (×2): 10 mg via ORAL
  Filled 2021-07-06 (×2): qty 2

## 2021-07-06 NOTE — Progress Notes (Signed)
?  Echocardiogram ?2D Echocardiogram has been performed. ? ?Antonio Ramos ?07/06/2021, 9:39 AM ?

## 2021-07-06 NOTE — Plan of Care (Signed)
  Problem: Education: Goal: Knowledge of disease or condition will improve Outcome: Progressing   Problem: Activity: Goal: Ability to tolerate increased activity will improve Outcome: Progressing   Problem: Cardiac: Goal: Ability to achieve and maintain adequate cardiopulmonary perfusion will improve Outcome: Progressing   

## 2021-07-06 NOTE — Plan of Care (Signed)

## 2021-07-06 NOTE — Progress Notes (Signed)
Pt arrived to 2C06 via [facility] from [facility].  Pt alert and oriented, agreeable to transfer.  Oriented to room and plan of care. VSS.  Pt states wife aware of transfer.  Dr. Aron Baba sent page for pt arrival.  Heparin infusing at 11.5 cc/hr, cardizem at 5cc/hr.  Pt currently in SR, rate 74. ?

## 2021-07-06 NOTE — Progress Notes (Signed)
ANTICOAGULATION CONSULT NOTE  ? ?Pharmacy Consult for heparin ?Indication: chest pain/ACS ? ?No Known Allergies ? ?Patient Measurements: ?Height: 6\' 2"  (188 cm) ?Weight: 95.5 kg (210 lb 8.6 oz) ?IBW/kg (Calculated) : 82.2 ?Heparin Dosing Weight: TBW ? ?Vital Signs: ?Temp: 98 ?F (36.7 ?C) (04/02 0300) ?Temp Source: Oral (04/02 0300) ?BP: 116/86 (04/02 0300) ?Pulse Rate: 67 (04/02 0300) ? ?Labs: ?Recent Labs  ?  07/05/21 ?2053 07/05/21 ?2315 07/06/21 ?0532  ?HGB 13.9  --  13.4  ?HCT 42.0  --  40.8  ?PLT 223  --  204  ?HEPARINUNFRC  --   --  0.13*  ?CREATININE 1.44*  --   --   ?TROPONINIHS 903* 937*  --   ? ? ? ?Estimated Creatinine Clearance: 61 mL/min (A) (by C-G formula based on SCr of 1.44 mg/dL (H)). ? ? ?Medical History: ?Past Medical History:  ?Diagnosis Date  ? Blood transfusion without reported diagnosis   ? Hemorrhagic shock ([place]) 11/23/2012  ? Hypertension   ? MI (myocardial infarction) ([place])   ? per patient report, unconfimred  ? PUD (peptic ulcer disease)   ? Diagnosed in 1986, with 3 prior bleeds since that time, most recently in 2004  ? ? ?Assessment: ?38 YOM presenting with SOB, nausea and epigastric pain, elevated troponin.  He is not on anticoagulation PTA, CBC wnl.   ? ?Heparin level subtherapeutic: 0.13 - no infusion issues or s/sx of bleeding reported by RN ? ?Goal of Therapy:  ?Heparin level 0.3-0.7 units/ml ?Monitor platelets by anticoagulation protocol: Yes ?  ?Plan:  ?Bolus 2500 units x1 ?Increase heparin gtt to 1450 units/hr ?F/u 6 hour heparin level ?F/u cards eval and recs ? ?Georga Bora, PharmD ?Clinical Pharmacist ?07/06/2021 6:40 AM ?Please check AMION for all [place] numbers ? ? ? ?

## 2021-07-06 NOTE — H&P (Addendum)
?Cardiology Admission History and Physical:  ? ?Patient ID: Antonio Ramos ?MRN: [id number]; DOB: 07/14/1960  ? ?Admission date: 07/05/2021 ? ?Primary Care Provider: [name], DO ?Primary Cardiologist: None  ?Primary Electrophysiologist:  None  ? ?Chief Complaint:  chest pain ? ?Patient Profile:  ? ?Antonio Ramos is a 61 y.o. male with htn, tobacco abuse >40 pack years, COPD and dyslipidemia. ? ?History of Present Illness:  ? ?Antonio Ramos with above hc presenting with doe and rapid heart rate.  HE states he was in his nl state of health until thrusday evening when he noticed chest pain and doe which limited his physial avtivity.  Could not walk far.  Became nauseous and vomtined thurs ngiht into fri am.  Thought it was gas however symptmos didn't improve so patietn present to [facility] yesterday, ? ?IN ED found to be tachy in 170s with ech with anterolerat st depression and t wave inversions and trop 900.  Patietn was started on cardizem drip and coverted to sinus in the 70-80s.  Given trop was set for transfer to [facility]. ? ? ? ? ?Past Medical History:  ?Diagnosis Date  ? Blood transfusion without reported diagnosis   ? Hemorrhagic shock ([place]) 11/23/2012  ? Hypertension   ? MI (myocardial infarction) ([place])   ? per patient report, unconfimred  ? PUD (peptic ulcer disease)   ? Diagnosed in 1986, with 3 prior bleeds since that time, most recently in 2004  ? ? ?Past Surgical History:  ?Procedure Laterality Date  ? ESOPHAGOGASTRODUODENOSCOPY N/A 11/23/2012  ? Procedure: ESOPHAGOGASTRODUODENOSCOPY (EGD);  Surgeon: Jeryl Columbia, MD;  Location: [facility] ENDOSCOPY;  Service: Endoscopy;  Laterality: N/A;  ? UPPER GI ENDOSCOPY    ?  ? ?Medications Prior to Admission: ?Prior to Admission medications   ?Medication Sig Start Date End Date Taking? Authorizing Provider  ?albuterol (PROVENTIL HFA;VENTOLIN HFA) 108 (90 BASE) MCG/ACT inhaler Inhale 1-2 puffs into the lungs every 6 (six) hours as needed for wheezing or shortness of  breath. ?Patient not taking: Reported on 12/17/2014 02/04/14   Elnora Morrison, MD  ?diclofenac sodium (VOLTAREN) 1 % GEL Apply 2 g topically 4 (four) times daily. 04/09/16   [name], DO  ?metroNIDAZOLE (FLAGYL) 500 MG tablet Take 1 tablet (500 mg total) by mouth 2 (two) times daily. 06/07/20   Daisy Floro, DO  ?nirmatrelvir & ritonavir (PAXLOVID, 300/100,) 20 x 150 MG & 10 x 100MG  TBPK Take 300mg  nirmatrelvir and 100mg  ritonavir twice daily for 5 days. 03/27/21   [name], DO  ?olmesartan-hydrochlorothiazide (BENICAR HCT) 40-12.5 MG tablet Take 1 tablet by mouth daily. 01/23/21   [name], DO  ?rosuvastatin (CRESTOR) 10 MG tablet Take 1 tablet (10 mg total) by mouth daily. 01/23/21 01/23/22  [name], DO  ?  ? ?Allergies:   No Known Allergies ? ?Social History:   ?Social History  ? ?Socioeconomic History  ? Marital status: Married  ?  Spouse name: Not on file  ? Number of children: Not on file  ? Years of education: Not on file  ? Highest education level: Not on file  ?Occupational History  ? Not on file  ?Tobacco Use  ? Smoking status: Former  ?  Packs/day: 0.20  ?  Years: 24.00  ?  Pack years: 4.80  ?  Types: Cigarettes  ?  Quit date: 03/17/2014  ?  Years since quitting: 7.3  ? Smokeless tobacco: Never  ?Substance and Sexual Activity  ?  Alcohol use: No  ?  Alcohol/week: 1.0 standard drink  ?  Types: 1 Standard drinks or equivalent per week  ?  Comment: states used to drink heavily in 1983  ? Drug use: No  ? Sexual activity: Not on file  ?Other Topics Concern  ? Not on file  ?Social History Narrative  ? Moved from [place] to [place] in 2009.  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  No PCP.  ? ?Social Determinants of Health  ? ?Financial Resource Strain: Not on file  ?Food Insecurity: Not on file  ?Transportation Needs: Not on file  ?Physical Activity: Not on file  ?Stress: Not on file  ?Social Connections: Not on file  ?Intimate Partner Violence: Not on file  ?  ?Family History:   ?The  patient's family history includes Heart attack in his father.   ? ? ?Review of Systems: [y] = yes, [ ]  = no  ? ?General: Weight gain [ ] ; Weight loss [ ] ; Anorexia [ ] ; Fatigue [ ] ; Fever [ ] ; Chills [ ] ; Weakness [ ]   ?Cardiac: Chest pain/pressure [ x]; Resting SOB [ ] ; Exertional SOB [x ]; Orthopnea [ ] ; Pedal Edema [ ] ; Palpitations [ ] ; Syncope [ ] ; Presyncope [ ] ; Paroxysmal nocturnal dyspnea[ ]   ?Pulmonary: Cough [ ] ; Wheezing[ ] ; Hemoptysis[ ] ; Sputum [ ] ; Snoring [ ]   ?GI: Vomiting[ ] ; Dysphagia[ ] ; Melena[ ] ; Hematochezia [ ] ; Heartburn[ ] ; Abdominal pain [ ] ; Constipation [ ] ; Diarrhea [ ] ; BRBPR [ ]   ?GU: Hematuria[ ] ; Dysuria [ ] ; Nocturia[ ]   ?Vascular: Pain in legs with walking [ ] ; Pain in feet with lying flat [ ] ; Non-healing sores [ ] ; Stroke [ ] ; TIA [ ] ; Slurred speech [ ] ;  ?Neuro: Headaches[ ] ; Vertigo[ ] ; Seizures[ ] ; Paresthesias[ ] ;Blurred vision [ ] ; Diplopia [ ] ; Vision changes [ ]   ?Ortho/Skin: Arthritis [ ] ; Joint pain [ ] ; Muscle pain [ ] ; Joint swelling [ ] ; Back Pain [ ] ; Rash [ ]   ?Psych: Depression[ ] ; Anxiety[ ]   ?Heme: Bleeding problems [ ] ; Clotting disorders [ ] ; Anemia [ ]   ?Endocrine: Diabetes [ ] ; Thyroid dysfunction[ ]  ? ?Physical Exam/Data:  ? ?Vitals:  ? 07/06/21 0000 07/06/21 0015 07/06/21 0129 07/06/21 0300  ?BP: (!) 127/93 (!) 131/92 (!) 148/114 116/86  ?Pulse: 70 68 69 67  ?Resp: 17 19 19 16   ?Temp:   97.9 ?F (36.6 ?C) 98 ?F (36.7 ?C)  ?TempSrc:   Oral Oral  ?SpO2: 95% 94% 94% 93%  ?Weight:   95.5 kg   ?Height:   6\' 2"  (1.88 m)   ? ?No intake or output data in the 24 hours ending 07/06/21 0607 ?Filed Weights  ? 07/05/21 2048 07/06/21 0129  ?Weight: 96.6 kg 95.5 kg  ? ?Body mass index is 27.03 kg/m?.  ?General:  Well nourished, well developed, in no acute distress ?HEENT: normal ?Lymph: no adenopathy ?Neck: no JVD ?Endocrine:  No thryomegaly ?Vascular: No carotid bruits; FA pulses 2+ bilaterally without bruits  ?Cardiac:  normal S1, S2; RRR; no murmur  ?Lungs:  clear to  auscultation bilaterally, no wheezing, rhonchi or rales  ?Abd: soft, nontender, no hepatomegaly  ?Ext: no edema ?Musculoskeletal:  No deformities, BUE and BLE strength normal and equal ?Skin: warm and dry  ?Neuro:  CNs 2-12 intact, no focal abnormalities noted ?Psych:  Normal affect  ? ? ?EKG:  The ECG that was done 4/1 was personally reviewed and demonstrates afib with rvr ? ?Relevant CV  Studies: ?N/a ? ?Laboratory Data: ? ?Chemistry ?Recent Labs  ?Lab 07/05/21 ?2053  ?NA 138  ?K 4.1  ?CL 103  ?CO2 26  ?GLUCOSE 119*  ?BUN 24*  ?CREATININE 1.44*  ?CALCIUM 9.4  ?GFRNONAA 55*  ?ANIONGAP 9  ?  ?No results for input(s): PROT, ALBUMIN, AST, ALT, ALKPHOS, BILITOT in the last 168 hours. ?Hematology ?Recent Labs  ?Lab 07/05/21 ?2053  ?WBC 8.7  ?RBC 5.06  ?HGB 13.9  ?HCT 42.0  ?MCV 83.0  ?MCH 27.5  ?MCHC 33.1  ?RDW 15.0  ?PLT 223  ? ?Cardiac EnzymesNo results for input(s): TROPONINI in the last 168 hours. No results for input(s): TROPIPOC in the last 168 hours.  ?BNPNo results for input(s): BNP, PROBNP in the last 168 hours.  ?DDimer No results for input(s): DDIMER in the last 168 hours. ? ?Radiology/Studies:  ?DG Chest Port 1 View ? ?Result Date: 07/05/2021 ?CLINICAL DATA:  Dyspnea EXAM: PORTABLE CHEST 1 VIEW COMPARISON:  10/02/2015 FINDINGS: The heart size and mediastinal contours are within normal limits. Both lungs are clear. The visualized skeletal structures are unremarkable. IMPRESSION: No active disease. Electronically Signed   By: Fidela Salisbury M.D.   On: 07/05/2021 21:47   ? ?Assessment and Plan:  ? ?NSTEMI ?Trop peak 930s ?Afib with RVR ?CHADS2VASC ~2 (HTN, VASCULAR) ?HTN ?Dyslipidemia ?COPD ? ? ? ?Plan: ?-Continue heparin drip and diltiazem which wil transition to po ?-Pt reluctant to complete cath but would complete consider Coronary CTA. ?-Echo pending ?-Check tsh ? ? ? ?Severity of Illness: ?The appropriate patient status for this patient is INPATIENT. Inpatient status is judged to be reasonable and necessary  in order to provide the required intensity of service to ensure the patient's safety. The patient's presenting symptoms, physical exam findings, and initial radiographic and laboratory data in the contex

## 2021-07-06 NOTE — Progress Notes (Signed)
ANTICOAGULATION CONSULT NOTE  ? ?Pharmacy Consult for heparin>>lovenox ?Indication: chest pain/ACS ? ?No Known Allergies ? ?Patient Measurements: ?Height: 6\' 2"  (188 cm) ?Weight: 95.5 kg (210 lb 8.6 oz) ?IBW/kg (Calculated) : 82.2 ?Heparin Dosing Weight: TBW ? ?Vital Signs: ?Temp: 97.7 ?F (36.5 ?C) (04/02 1132) ?BP: 142/96 (04/02 1132) ? ?Labs: ?Recent Labs  ?  07/05/21 ?2053 07/05/21 ?2315 07/06/21 ?0532 07/06/21 ?1430  ?HGB 13.9  --  13.4  --   ?HCT 42.0  --  40.8  --   ?PLT 223  --  204  --   ?HEPARINUNFRC  --   --  0.13* 0.32  ?CREATININE 1.44*  --   --   --   ?TROPONINIHS 903* 937*  --   --   ? ? ? ?Estimated Creatinine Clearance: 61 mL/min (A) (by C-G formula based on SCr of 1.44 mg/dL (H)). ? ? ?Medical History: ?Past Medical History:  ?Diagnosis Date  ? Blood transfusion without reported diagnosis   ? Hemorrhagic shock ([place]) 11/23/2012  ? Hypertension   ? MI (myocardial infarction) ([place])   ? per patient report, unconfimred  ? PUD (peptic ulcer disease)   ? Diagnosed in 1986, with 3 prior bleeds since that time, most recently in 2004  ? ? ?Assessment: ?39 YOM presenting with SOB, nausea and epigastric pain, elevated troponin.  He is not on anticoagulation PTA, CBC wnl.   ? ?Heparin level now at goal on check this afternoon. No bleeding issues noted, discussed with cardiology since no invasive procedures currently planning will transition patient to heparin to limit lab drawns. ? ?Goal of Therapy:  ?Anti-Xa level 0.6-1 units/ml 4hrs after LMWH dose given ?Monitor platelets by anticoagulation protocol: Yes ?  ?Plan:  ?Stop heparin at 5pm, give lovenox shot at 6p ?CBC in am then prn ? ?Erin Hearing PharmD., BCPS ?Clinical Pharmacist ?07/06/2021 3:20 PM ? ?

## 2021-07-07 ENCOUNTER — Other Ambulatory Visit (HOSPITAL_COMMUNITY): Payer: Self-pay

## 2021-07-07 ENCOUNTER — Inpatient Hospital Stay (HOSPITAL_COMMUNITY): Payer: BC Managed Care – PPO

## 2021-07-07 ENCOUNTER — Other Ambulatory Visit: Payer: Self-pay | Admitting: Physician Assistant

## 2021-07-07 DIAGNOSIS — I5021 Acute systolic (congestive) heart failure: Secondary | ICD-10-CM

## 2021-07-07 DIAGNOSIS — I428 Other cardiomyopathies: Secondary | ICD-10-CM

## 2021-07-07 DIAGNOSIS — E785 Hyperlipidemia, unspecified: Secondary | ICD-10-CM

## 2021-07-07 DIAGNOSIS — I214 Non-ST elevation (NSTEMI) myocardial infarction: Secondary | ICD-10-CM

## 2021-07-07 LAB — BASIC METABOLIC PANEL
Anion gap: 6 (ref 5–15)
BUN: 14 mg/dL (ref 8–23)
CO2: 26 mmol/L (ref 22–32)
Calcium: 9 mg/dL (ref 8.9–10.3)
Chloride: 106 mmol/L (ref 98–111)
Creatinine, Ser: 1.25 mg/dL — ABNORMAL HIGH (ref 0.61–1.24)
GFR, Estimated: >60 mL/min (ref >60)
Glucose, Bld: 109 mg/dL — ABNORMAL HIGH (ref 70–99)
Potassium: 3.9 mmol/L (ref 3.5–5.1)
Sodium: 138 mmol/L (ref 135–145)

## 2021-07-07 LAB — CBC
HCT: 41.4 % (ref 39.0–52.0)
Hemoglobin: 13.9 g/dL (ref 13.0–17.0)
MCH: 28.3 pg (ref 26.0–34.0)
MCHC: 33.6 g/dL (ref 30.0–36.0)
MCV: 84.3 fL (ref 80.0–100.0)
Platelets: 199 10*3/uL (ref 150–400)
RBC: 4.91 MIL/uL (ref 4.22–5.81)
RDW: 14.7 % (ref 11.5–15.5)
WBC: 6.1 10*3/uL (ref 4.0–10.5)
nRBC: 0 % (ref 0.0–0.2)

## 2021-07-07 MED ORDER — APIXABAN 5 MG PO TABS
5.0000 mg | ORAL_TABLET | Freq: Once | ORAL | Status: AC
  Administered 2021-07-07: 5 mg via ORAL
  Filled 2021-07-07: qty 1

## 2021-07-07 MED ORDER — METOPROLOL SUCCINATE ER 50 MG PO TB24: 50.0000 mg | ORAL_TABLET | Freq: Every day | ORAL | 3 refills | Status: DC

## 2021-07-07 MED ORDER — NITROGLYCERIN 0.4 MG SL SUBL
SUBLINGUAL_TABLET | SUBLINGUAL | Status: AC
  Filled 2021-07-07: qty 2

## 2021-07-07 MED ORDER — IOHEXOL 350 MG/ML SOLN
100.0000 mL | Freq: Once | INTRAVENOUS | Status: AC | PRN
  Administered 2021-07-07: 100 mL via INTRAVENOUS

## 2021-07-07 MED ORDER — APIXABAN 5 MG PO TABS: 5.0000 mg | ORAL_TABLET | Freq: Two times a day (BID) | ORAL | 6 refills | Status: DC

## 2021-07-07 MED ORDER — APIXABAN 5 MG PO TABS: 5.0000 mg | ORAL_TABLET | Freq: Two times a day (BID) | ORAL | 11 refills | Status: DC

## 2021-07-07 MED ORDER — ROSUVASTATIN CALCIUM 20 MG PO TABS
20.0000 mg | ORAL_TABLET | Freq: Every day | ORAL | 3 refills | Status: DC
Start: 2021-07-07 — End: 2022-05-06

## 2021-07-07 NOTE — Discharge Summary (Addendum)
?Discharge Summary  ?  ?Patient ID: Antonio Ramos ?MRN: [id number]; DOB: 1960/08/22 ? ?Admit date: 07/05/2021 ?Discharge date: 07/07/2021 ? ?PCP:  Gust Rung, DO ?  ?[facility] Providers ?Cardiologist:  Christell Constant, MD  ? ?Discharge Diagnoses  ?  ?Principal Problem: ?  NSTEMI (non-ST elevated myocardial infarction) (HCC) ?Active Problems: ?  Essential hypertension ?  Tobacco abuse ?  Hyperlipidemia LDL goal <70 ?  Nonischemic cardiomyopathy (HCC) ?  Acute systolic heart failure (HCC) ? ? ?Diagnostic Studies/Procedures  ?  ?CT coronary 07/07/21: ?IMPRESSION: ?1. Coronary calcium score of 0. This was 0 percentile for age-, ?race-, and sex-matched controls. ?  ?2. 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 The RCA courses ?anteriorly between the aorta and the right ventricle. Right ?dominance. ?  ?3. No evidence of CAD. ?_____________ ?  ?Echo 07/06/21: ? 1. Left ventricular ejection fraction, by estimation, is 45 to 50%. The  ?left ventricle has mildly decreased function. The left ventricle  ?demonstrates global hypokinesis. There is moderate concentric left  ?ventricular hypertrophy. Left ventricular  ?diastolic parameters are consistent with Grade II diastolic dysfunction  ?(pseudonormalization). Elevated left atrial pressure.  ? 2. Right ventricular systolic function is normal. The right ventricular  ?size is normal. Tricuspid regurgitation signal is inadequate for assessing  ?PA pressure.  ? 3. Left atrial size was mildly dilated.  ? 4. The mitral valve is abnormal. Mild to moderate mitral valve  ?regurgitation. No evidence of mitral stenosis.  ? 5. The aortic valve is tricuspid. Aortic valve regurgitation is not  ?visualized. No aortic stenosis is present.  ? ?History of Present Illness   ?  ?Antonio Ramos is a 61 y.o. male with a PMH of htn, tobacco abuse >40 pack years, COPD and dyslipidemia. ? ?Mr. Dygert with above hx presenting with doe and rapid heart rate.  He states he was in his nl state of health  until thursday evening when he noticed chest pain and doe which limited his physial avtivity.  Could not walk far.  Became nauseous and vomtined thurs night into fri am.  Thought it was gas however symptmos didn't improve so patietn present to [facility] yesterday, ?  ?In ED found to be tachy in 170s with ecg with anterolerat st depression and t wave inversions and trop 900.  Patient was started on cardizem drip and coverted to sinus in the 70-80s.  Given trop was set for transfer to [facility]. ?  ? ?Hospital Course  ?   ?Consultants: none ? ?Afib RVR ?Need for chronic anticoagulation ?Pt converted to sinus rhythm on cardizem gtt. Cardizem D/C'ed in the setting of mild cardiomyopathy.  ?Continue toprol.  ?Pt remains in sinus rhythm.  ?Started on eliquis 5 mg BID. He is aware of the cost - gave 30 day supply with 11 refills. ? ? ?NSTEMI ?New systolic heart failure ?Mild cardiomyopathy ?LVEF 45% ?Heart catheterization was recommended but patient declined. CT coronary was performed and showed no evidence of CAD. Continue toprol.  ?ARB-HCTZ --> consider transitioning to low dose entresto in the outpatient setting.  ? ? ?Hyperlipidemia ?07/06/2021: Cholesterol 185; HDL 36; LDL Cholesterol 129; Triglycerides 100; VLDL 20 ?Start 20 mg crestor, although statin intolerance listed on problem list. Consider zetia  ? ? ?AKI ?sCr 1.44 --> 1.25 at discharge ?Repeat a BMP in 1 week.  ? ? ?COPD ?Current smoker ?Knows he needs to quit.  ? ? ?Pt seen and examined by Dr. Izora Ribas and deemed stable for discharge. He  is aware of the cost of eliquis. Will need to be mindful of the cost of additional medications.  ? ? ? ?Did the patient have an acute coronary syndrome (MI, NSTEMI, STEMI, etc) this admission?:  No.   ?The elevated Troponin was due to the acute medical illness (demand ischemia).  ? ?   ? ?The patient will be scheduled for a TOC follow up appointment in 7-14 days.  A message has been sent to the [facility] and  Scheduling Pool at the office where the patient should be seen for follow up.  ?_____________ ? ?Discharge Vitals ?Blood pressure (!) 151/98, pulse 64, temperature 97.7 ?F (36.5 ?C), temperature source Oral, resp. rate 20, height 6\' 2"  (1.88 m), weight 95.5 kg, SpO2 95 %.  ?Filed Weights  ? 07/05/21 2048 07/06/21 0129  ?Weight: 96.6 kg 95.5 kg  ? ? ?Labs & Radiologic Studies  ?  ?CBC ?Recent Labs  ?  07/06/21 ?0532 07/07/21 ?0125  ?WBC 6.8 6.1  ?HGB 13.4 13.9  ?HCT 40.8 41.4  ?MCV 83.8 84.3  ?PLT 204 199  ? ?Basic Metabolic Panel ?Recent Labs  ?  07/05/21 ?2053 07/05/21 ?2315 07/07/21 ?0125  ?NA 138  --  138  ?K 4.1  --  3.9  ?CL 103  --  106  ?CO2 26  --  26  ?GLUCOSE 119*  --  109*  ?BUN 24*  --  14  ?CREATININE 1.44*  --  1.25*  ?CALCIUM 9.4  --  9.0  ?MG  --  2.1  --   ? ?Liver Function Tests ?No results for input(s): AST, ALT, ALKPHOS, BILITOT, PROT, ALBUMIN in the last 72 hours. ?No results for input(s): LIPASE, AMYLASE in the last 72 hours. ?High Sensitivity Troponin:   ?Recent Labs  ?Lab 07/05/21 ?2053 07/05/21 ?2315  ?TROPONINIHS 903* 937*  ?  ?BNP ?Invalid input(s): POCBNP ?D-Dimer ?No results for input(s): DDIMER in the last 72 hours. ?Hemoglobin A1C ?No results for input(s): HGBA1C in the last 72 hours. ?Fasting Lipid Panel ?Recent Labs  ?  07/06/21 ?0743  ?CHOL 185  ?HDL 36*  ?LDLCALC 129*  ?TRIG 100  ?CHOLHDL 5.1  ? ?Thyroid Function Tests ?Recent Labs  ?  07/06/21 ?0743  ?TSH 4.442  ? ?_____________  ?CT CORONARY MORPH W/CTA COR W/SCORE W/CA W/CM &/OR WO/CM ? ?Addendum Date: 07/07/2021   ?ADDENDUM REPORT: 07/07/2021 11:36 CLINICAL DATA:  55M with systolic heart failure, tobacco abuse, PAS, and elevated cardiac enzymes. EXAM: Cardiac/Coronary  CT TECHNIQUE: The patient was scanned on a 09/06/2021. FINDINGS: A 120 kV prospective scan was triggered in the descending thoracic aorta at 111 HU's. Axial non-contrast 3 mm slices were carried out through the heart. The data set was analyzed on a  dedicated work station and scored using the Agatson method. Gantry rotation speed was 250 msecs and collimation was .6 mm. No beta blockade and 0.8 mg of sl NTG was given. The 3D data set was reconstructed in 5% intervals of the 67-82 % of the R-R cycle. Diastolic phases were analyzed on a dedicated work station using MPR, MIP and VRT modes. The patient received 80 cc of contrast. Aorta: Normal size.  No calcifications.  No dissection. Aortic Valve:  Trileaflet.  No calcifications. 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 Right dominance. RCA is a large dominant artery that gives rise to PDA and PLVB. There is no plaque. Left main  is a large artery that gives rise to LAD, RI, and LCX arteries. LAD is a large vessel that has no plaque. There is a large D1 and a small D2 without plaque. RI is a large vessel with no plaque. LCX is a non-dominant artery that gives rise to a tiny OM1 large OM1 branch, and small OM3. There is no plaque. Coronary Calcium Score: 0 Percentile: 0 Other findings: Normal pulmonary vein drainage into the left atrium. Normal let atrial appendage without a thrombus. Normal size of the pulmonary artery. IMPRESSION: 1. Coronary calcium score of 0. This was 0 percentile for age-, race-, and sex-matched controls. 2. 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 Right dominance. 3. No evidence of CAD. Chilton Si, MD Electronically Signed   By: Chilton Si M.D.   On: 07/07/2021 11:36  ? ?Result Date: 07/07/2021 ?EXAM: OVER-READ INTERPRETATION  CT CHEST The following report is an over-read performed by radiologist Dr. Donzetta Kohut of [facility] on 07/07/2021. This over-read does not include interpretation of cardiac or coronary anatomy or pathology. The coronary calcium score/coronary CTA interpretation by the cardiologist is  attached. COMPARISON:  January 2017. FINDINGS: Vascular: See dedicated report for cardiovascular details. Mediastinum/Nodes: There is no adenopathy in the visualized portions of the chest. Lungs/Pleura: Kristeen Mans

## 2021-07-07 NOTE — Progress Notes (Signed)
?  Evaluation after Contrast Extravasation ? ?Patient seen and examined immediately after contrast extravasation while in CT scanner. ? ?Exam: ?There is no swelling at the right arm area.  ?There is no erythema. ?There is no discoloration. ?There are no blisters. ?There are no signs of decreased perfusion of the skin.  ?It is warm to touch.  ?The patient has full ROM in fingers.  ?Radial pulse is normal. ? ?Per contrast extravasation protocol, I have instructed the patient to keep an ice pack on the area for 20-60 minutes at a time for about 48 hours. ?  ?Keep arm elevated as much as possible. ?  ?The patient understands to call the radiology department if there is: ?- increase in pain or swelling ?- changed or altered sensation ?- ulceration or blistering ?- increasing redness ?- warmth or increasing firmness ?- decreased tissue perfusion as noted by decreased capillary refill or discoloration of skin ?- decreased pulses peripheral to site ? ? ?Alene Mires PA-C ?07/07/2021 ?10:40 AM ? ? ? ? ?

## 2021-07-07 NOTE — Progress Notes (Signed)
Discharge instructions provided to patient. Antonio Ramos has no questions at this time. IV removed per protocol.  ?

## 2021-07-07 NOTE — Progress Notes (Signed)
? ?Progress Note ? ?Patient Name: Antonio Ramos ?Date of Encounter: 07/07/2021 ? ?Primary Cardiologist: New to [facility] ? ?Subjective  ? ?Found to have LVEF 45% ?Did not want Cath unless evidence of obstructive disease. ?Planned for Cardiac CT today (ordered) ?No chest pain over night ?Has pain in left arm IV (non DiffusX ) ? ?Inpatient Medications  ?  ?Scheduled Meds: ? aspirin  325 mg Oral Daily  ? enoxaparin (LOVENOX) injection  100 mg Subcutaneous Q12H  ? irbesartan  300 mg Oral Daily  ? And  ? hydrochlorothiazide  12.5 mg Oral Daily  ? metoprolol succinate  50 mg Oral Daily  ? rosuvastatin  10 mg Oral Daily  ? ?Continuous Infusions: ? ?PRN Meds: ?acetaminophen, albuterol, diclofenac Sodium, ondansetron (ZOFRAN) IV  ? ?Vital Signs  ?  ?Vitals:  ? 07/06/21 1630 07/06/21 1950 07/06/21 2326 07/07/21 0323  ?BP: 126/84 131/85 123/79 (!) 137/95  ?Pulse:    64  ?Resp: 17 20 17 20   ?Temp: 97.9 ?F (36.6 ?C) 98.2 ?F (36.8 ?C) 98.4 ?F (36.9 ?C) 97.9 ?F (36.6 ?C)  ?TempSrc:  Oral Oral Oral  ?SpO2:  94% 95% 95%  ?Weight:      ?Height:      ? ? ?Intake/Output Summary (Last 24 hours) at 07/07/2021 0740 ?Last data filed at 07/07/2021 0324 ?Gross per 24 hour  ?Intake 625.36 ml  ?Output 1 ml  ?Net 624.36 ml  ? ?Filed Weights  ? 07/05/21 2048 07/06/21 0129  ?Weight: 96.6 kg 95.5 kg  ? ? ?Telemetry  ?  ?SR no further AF - Personally Reviewed ? ? ?Physical Exam  ? ?Gen: no distress   ?Neck: No JVD ?Cardiac: No Rubs or Gallops, no Murmur, RRR +2radial pulses ?Respiratory: Clear to auscultation bilaterally, normal effort, normal  respiratory rate ?GI: Soft, nontender, non-distended  ?MS: No  edema;  moves all extremities ?Integument: Skin feels warm ?Neuro:  At time of evaluation, alert and oriented to person/place/time/situation  ?Psych: Normal affect, patient feels fine ? ? ?Labs  ?  ?Chemistry ?Recent Labs  ?Lab 07/05/21 ?2053 07/07/21 ?0125  ?NA 138 138  ?K 4.1 3.9  ?CL 103 106  ?CO2 26 26  ?GLUCOSE 119* 109*  ?BUN 24* 14   ?CREATININE 1.44* 1.25*  ?CALCIUM 9.4 9.0  ?GFRNONAA 55* >60  ?ANIONGAP 9 6  ?  ? ?Hematology ?Recent Labs  ?Lab 07/05/21 ?2053 07/06/21 ?0532 07/07/21 ?0125  ?WBC 8.7 6.8 6.1  ?RBC 5.06 4.87 4.91  ?HGB 13.9 13.4 13.9  ?HCT 42.0 40.8 41.4  ?MCV 83.0 83.8 84.3  ?MCH 27.5 27.5 28.3  ?MCHC 33.1 32.8 33.6  ?RDW 15.0 14.9 14.7  ?PLT 223 204 199  ? ? ?Cardiac EnzymesNo results for input(s): TROPONINI in the last 168 hours. No results for input(s): TROPIPOC in the last 168 hours.  ? ?BNPNo results for input(s): BNP, PROBNP in the last 168 hours.  ? ?DDimer No results for input(s): DDIMER in the last 168 hours.  ? ?Radiology  ?  ?DG Chest Port 1 View ? ?Result Date: 07/05/2021 ?CLINICAL DATA:  Dyspnea EXAM: PORTABLE CHEST 1 VIEW COMPARISON:  10/02/2015 FINDINGS: The heart size and mediastinal contours are within normal limits. Both lungs are clear. The visualized skeletal structures are unremarkable. IMPRESSION: No active disease. Electronically Signed   By: Fidela Salisbury M.D.   On: 07/05/2021 21:47  ? ?ECHOCARDIOGRAM COMPLETE ? ?Result Date: 07/06/2021 ?   ECHOCARDIOGRAM REPORT   Patient Name:   Antonio Ramos Date of Exam: 07/06/2021  Medical Rec #:  [id number]   Height:       74.0 in Accession #:    [id number]  Weight:       210.5 lb Date of Birth:  Jun 25, 1960   BSA:          2.221 m? Patient Age:    61 years    BP:           135/97 mmHg Patient Gender: M           HR:           68 bpm. Exam Location:  Inpatient Procedure: 2D Echo, Cardiac Doppler, Color Doppler and 3D Echo Indications:    Acute myocardial infarction  History:        Patient has no prior history of Echocardiogram examinations.                 COPD; Risk Factors:Hypertension, Current Smoker and                 Dyslipidemia.  Sonographer:    Clayton Lefort RDCS (AE) Referring Phys: [id number] [facility]  Sonographer Comments: Suboptimal subcostal window. IMPRESSIONS  1. Left ventricular ejection fraction, by estimation, is 45 to 50%. The left ventricle has mildly  decreased function. The left ventricle demonstrates global hypokinesis. There is moderate concentric left ventricular hypertrophy. Left ventricular diastolic parameters are consistent with Grade II diastolic dysfunction (pseudonormalization). Elevated left atrial pressure.  2. Right ventricular systolic function is normal. The right ventricular size is normal. Tricuspid regurgitation signal is inadequate for assessing PA pressure.  3. Left atrial size was mildly dilated.  4. The mitral valve is abnormal. Mild to moderate mitral valve regurgitation. No evidence of mitral stenosis.  5. The aortic valve is tricuspid. Aortic valve regurgitation is not visualized. No aortic stenosis is present. Comparison(s): No prior Echocardiogram. FINDINGS  Left Ventricle: Left ventricular ejection fraction, by estimation, is 45 to 50%. The left ventricle has mildly decreased function. The left ventricle demonstrates global hypokinesis. The left ventricular internal cavity size was normal in size. There is  moderate concentric left ventricular hypertrophy. Left ventricular diastolic parameters are consistent with Grade II diastolic dysfunction (pseudonormalization). Elevated left atrial pressure. Right Ventricle: The right ventricular size is normal. No increase in right ventricular wall thickness. Right ventricular systolic function is normal. Tricuspid regurgitation signal is inadequate for assessing PA pressure. Left Atrium: Left atrial size was mildly dilated. Right Atrium: Right atrial size was normal in size. Pericardium: There is no evidence of pericardial effusion. Mitral Valve: The mitral valve is abnormal. Mild to moderate mitral valve regurgitation. No evidence of mitral valve stenosis. MV peak gradient, 8.6 mmHg. The mean mitral valve gradient is 2.0 mmHg. Tricuspid Valve: The tricuspid valve is normal in structure. Tricuspid valve regurgitation is not demonstrated. No evidence of tricuspid stenosis. Aortic Valve: The  aortic valve is tricuspid. Aortic valve regurgitation is not visualized. No aortic stenosis is present. Aortic valve mean gradient measures 2.0 mmHg. Aortic valve peak gradient measures 4.4 mmHg. Aortic valve area, by VTI measures 2.98 cm?. Pulmonic Valve: The pulmonic valve was not well visualized. Pulmonic valve regurgitation is not visualized. No evidence of pulmonic stenosis. Aorta: The aortic root and ascending aorta are structurally normal, with no evidence of dilitation. IAS/Shunts: No atrial level shunt detected by color flow Doppler.  LEFT VENTRICLE PLAX 2D LVIDd:         5.10 cm      Diastology LVIDs:  3.20 cm      LV e' medial:    6.60 cm/s LV PW:         1.40 cm      LV E/e' medial:  16.7 LV IVS:        1.30 cm      LV e' lateral:   5.48 cm/s LVOT diam:     2.00 cm      LV E/e' lateral: 20.1 LV SV:         62 LV SV Index:   28 LVOT Area:     3.14 cm?                              3D Volume EF: LV Volumes (MOD)            3D EF:        44 % LV vol d, MOD A2C: 132.0 ml LV EDV:       176 ml LV vol d, MOD A4C: 162.0 ml LV ESV:       98 ml LV vol s, MOD A2C: 72.3 ml  LV SV:        78 ml LV vol s, MOD A4C: 84.2 ml LV SV MOD A2C:     59.7 ml LV SV MOD A4C:     162.0 ml LV SV MOD BP:      69.5 ml RIGHT VENTRICLE RV Basal diam:  3.70 cm RV Mid diam:    3.10 cm RV S prime:     6.98 cm/s TAPSE (M-mode): 1.5 cm LEFT ATRIUM             Index        RIGHT ATRIUM           Index LA diam:        4.10 cm 1.85 cm/m?   RA Area:     19.40 cm? LA Vol (A2C):   84.2 ml 37.90 ml/m?  RA Volume:   56.00 ml  25.21 ml/m? LA Vol (A4C):   70.6 ml 31.78 ml/m? LA Biplane Vol: 77.2 ml 34.75 ml/m?  AORTIC VALVE AV Area (Vmax):    3.11 cm? AV Area (Vmean):   2.86 cm? AV Area (VTI):     2.98 cm? AV Vmax:           105.00 cm/s AV Vmean:          71.900 cm/s AV VTI:            0.209 m AV Peak Grad:      4.4 mmHg AV Mean Grad:      2.0 mmHg LVOT Vmax:         104.00 cm/s LVOT Vmean:        65.500 cm/s LVOT VTI:          0.198 m LVOT/AV  VTI ratio: 0.95  AORTA Ao Root diam: 3.60 cm Ao Asc diam:  3.20 cm MITRAL VALVE MV Area (PHT): 3.91 cm?       SHUNTS MV Area VTI:   1.88 cm?       Systemic VTI:  0.20 m MV Peak grad:  8.6 mmHg       Systemic Diam: 2.00

## 2021-07-07 NOTE — TOC Benefit Eligibility Note (Signed)
Patient Advocate Encounter ? ?Insurance verification completed.   ? ?The patient is currently admitted and upon discharge could be taking Xarelto 20 mg. ? ?The current 30 day co-pay is, $490.66, .100% patient pay applies at retail       ?unless filled at a [place]         ? ?The patient is currently admitted and upon discharge could be taking Eliquis 5 mg. ? ?The current 30 day co-pay is, $507.19 .100% patient pay applies at retail       ?unless filled at a [place]               ? ?The patient is insured through [employer]  ? ? ? ?Lyndel Safe, CPhT ?Pharmacy Patient Advocate Specialist ?[place] Patient Advocate Team ?Direct Number: [phone]  Fax: [phone] ? ? ? ? ? ?  ?

## 2021-07-07 NOTE — Progress Notes (Signed)
[place] to inquire about pricing of Eliquis - it will be $143. ? ?[place] Crane, Pharm D, BCPS, BCCP ?Clinical Pharmacist ? 07/07/2021 12:55 PM  ? ?[facility] pharmacy phone numbers are listed on [email or web address] ? ?

## 2021-07-10 ENCOUNTER — Encounter: Payer: BC Managed Care – PPO | Admitting: Internal Medicine

## 2021-07-14 NOTE — Progress Notes (Signed)
? ?Office Visit  ?  ?Patient Name: Antonio Ramos ?Date of Encounter: 07/15/2021 ? ?PCP:  Gust Rung, DO ?  ?[facility]  ?Cardiologist:  Christell Constant, MD  ?Advanced Practice Provider:  No care team member to display ?Electrophysiologist:  None  ? ? ?Chief Complaint  ?  ?Antonio Ramos is a 61 y.o. male with a hx of NSTEMI (no coronary disease), HTN, tobacco use, HLD, NICM, systolic heart failure, COPD presents today for hospital follow up  ? ?Past Medical History  ?  ?Past Medical History:  ?Diagnosis Date  ? Atrial fibrillation (HCC) 07/2021  ? Blood transfusion without reported diagnosis   ? Hemorrhagic shock (HCC) 11/23/2012  ? HFrEF (heart failure with reduced ejection fraction) (HCC) 07/15/2021  ? (a) 07/2021 LVEF 45%  ? Hyperlipidemia   ? Hypertension   ? MI (myocardial infarction) (HCC)   ? per patient report, unconfimred  ? PUD (peptic ulcer disease)   ? Diagnosed in 1986, with 3 prior bleeds since that time, most recently in 2004  ? Tobacco use   ? ?Past Surgical History:  ?Procedure Laterality Date  ? ESOPHAGOGASTRODUODENOSCOPY N/A 11/23/2012  ? Procedure: ESOPHAGOGASTRODUODENOSCOPY (EGD);  Surgeon: Petra Kuba, MD;  Location: [facility] ENDOSCOPY;  Service: Endoscopy;  Laterality: N/A;  ? UPPER GI ENDOSCOPY    ? ? ?Allergies ? ?No Known Allergies ? ?History of Present Illness  ?  ?Antonio Ramos is a 61 y.o. male with a hx of NSTEMI (no coronary disease), HTN, tobacco use, HLD, NICM, systolic heart failure, COPD last seen while hospitalized. ? ?He was admitted 07/05/21 after presenting with tachycardia, chest pain, dyspnea. He was found to be in atrial fibrillation with anterolateral ST depression and TWI with troponin 900. He converted to NSR with Cardizem drip. This was subsequently discontinued as echo revealed LVEF 45%. He declined cardiac cath so cardiac CT performed revealing no evidence of CAD. He wa started on Crestor prior to discharge.  ? ?He presents today for follow up.  Very pleasant gentleman who has previously served in [employer] and worked as a IT sales professional, Emergency planning/management officer, Curator. Shares with me that his daughter is expecting a baby in July and he is very excited. His wife is a Engineer, civil (consulting) and checks BP at home but he cannot recall readings. He currently works as a Merchandiser, retail for [employer] working evenings 3 days per week which he enjoys. Hopeful to return to work soon. Since discharge notes no shortness of breath nor dyspnea on exertion. Reports no chest pain, pressure, or tightness. No edema, orthopnea, PND. Reports no palpitations.  We reviewed hospitalization records, heart failure and atrial fibrillation management.  ? ?EKGs/Labs/Other Studies Reviewed:  ? ?The following studies were reviewed today: ?  ?CT coronary 07/07/21: ?IMPRESSION: ?1. Coronary calcium score of 0. This was 0 percentile for age-, ?race-, and sex-matched controls. ?  ?2. 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 The RCA courses ?anteriorly between the aorta and the right ventricle. Right ?dominance. ?  ?3. No evidence of CAD. ?_____________ ?  ?Echo 07/06/21: ? 1. Left ventricular ejection fraction, by estimation, is 45 to 50%. The  ?left ventricle has mildly decreased function. The left ventricle  ?demonstrates global hypokinesis. There is moderate concentric left  ?ventricular hypertrophy. Left ventricular  ?diastolic parameters are consistent with Grade II diastolic dysfunction  ?(pseudonormalization). Elevated left atrial pressure.  ? 2. Right ventricular systolic function is normal. The right ventricular  ?size is normal. Tricuspid regurgitation  signal is inadequate for assessing  ?PA pressure.  ? 3. Left atrial size was mildly dilated.  ? 4. The mitral valve is abnormal. Mild to moderate mitral valve  ?regurgitation. No evidence of mitral stenosis.  ? 5. The aortic valve is tricuspid. Aortic valve regurgitation is not  ?visualized. No aortic stenosis is present.  ? ?EKG:  EKG is  ordered  today.  The ekg ordered today demonstrates NSR 63 bpm with stable inferolateral TWI ? ?Recent Labs: ?07/05/2021: Magnesium 2.1 ?07/06/2021: TSH 4.442 ?07/07/2021: BUN 14; Creatinine, Ser 1.25; Hemoglobin 13.9; Platelets 199; Potassium 3.9; Sodium 138  ?Recent Lipid Panel ?   ?Component Value Date/Time  ? CHOL 185 07/06/2021 0743  ? CHOL 230 (H) 01/23/2021 0935  ? TRIG 100 07/06/2021 0743  ? HDL 36 (L) 07/06/2021 0743  ? HDL 45 01/23/2021 0935  ? CHOLHDL 5.1 07/06/2021 0743  ? VLDL 20 07/06/2021 0743  ? LDLCALC 129 (H) 07/06/2021 0743  ? LDLCALC 159 (H) 01/23/2021 0935  ? ? ?Risk Assessment/Calculations:  ? ?CHA2DS2-VASc Score = 2  ? This indicates a 2.2% annual risk of stroke. ?The patient's score is based upon: ?CHF History: 1 ?HTN History: 1 ?Diabetes History: 0 ?Stroke History: 0 ?Vascular Disease History: 0 ?Age Score: 0 ?Gender Score: 0 ?  ? ? ?Home Medications  ? ?Current Meds  ?Medication Sig  ? apixaban (ELIQUIS) 5 MG TABS tablet Take 1 tablet (5 mg total) by mouth 2 (two) times daily.  ? metoprolol succinate (TOPROL-XL) 50 MG 24 hr tablet Take 1 tablet (50 mg total) by mouth daily. Take with or immediately following a meal.  ? rosuvastatin (CRESTOR) 20 MG tablet Take 1 tablet (20 mg total) by mouth daily.  ? sacubitril-valsartan (ENTRESTO) 49-51 MG Take 1 tablet by mouth 2 (two) times daily.  ? [DISCONTINUED] olmesartan-hydrochlorothiazide (BENICAR HCT) 40-12.5 MG tablet Take 1 tablet by mouth daily.  ?  ? ?Review of Systems  ?    ?All other systems reviewed and are otherwise negative except as noted above. ? ?Physical Exam  ?  ?VS:  BP (!) 162/102 (BP Location: Right Arm, Patient Position: Sitting, Cuff Size: Normal)   Pulse 63   Ht 6\' 2"  (1.88 m)   Wt 212 lb 4.8 oz (96.3 kg)   BMI 27.26 kg/m?  , BMI Body mass index is 27.26 kg/m?. ? ?Wt Readings from Last 3 Encounters:  ?07/15/21 212 lb 4.8 oz (96.3 kg)  ?07/06/21 210 lb 8.6 oz (95.5 kg)  ?01/23/21 213 lb 1.6 oz (96.7 kg)  ?  ?GEN: Well nourished, well  developed, in no acute distress. ?HEENT: normal. ?Neck: Supple, no JVD, carotid bruits, or masses. ?Cardiac: RRR, no murmurs, rubs, or gallops. No clubbing, cyanosis, edema.  Radials/PT 2+ and equal bilaterally.  ?Respiratory:  Respirations regular and unlabored, clear to auscultation bilaterally. ?GI: Soft, nontender, nondistended. ?MS: No deformity or atrophy. ?Skin: Warm and dry, no rash. ?Neuro:  Strength and sensation are intact. ?Psych: Normal affect. ? ?Assessment & Plan  ?  ?Atrial fibrillation / Hypercoagulable state -maintaining sinus by EKG today.  Echo during admission with no significant valvular abnormalities.  Lab work during recent admission with normal TSH, electrolytes.  Continue Toprol 50 mg daily, Eliquis 5 mg twice daily.  Denies bleeding complications.  CBC in 2 weeks to ensure stability. CHA2DS2-VASc Score = 2 [CHF History: 1, HTN History: 1, Diabetes History: 0, Stroke History: 0, Vascular Disease History: 0, Age Score: 0, Gender Score: 0].  Therefore, the patient's  annual risk of stroke is 2.2 %.     ? ?CHF - LVEF 45%. Euvolemic and well compensated on exam.  For optimization of heart failure therapy as well as blood pressure control we will stop olmesartan-HCTZ and start Entresto 49-51 mg twice daily.  Additional GDMT includes Toprol.  He is not requiring loop diuretic at this time.  Future considerations include MRA/SGLT2i.  Anticipate LVEF depressed in the setting of atrial fibrillation.  Cardiac CTA 07/07/2021 with coronary artery calcium score of 0.  Plan for repeat echo in 3 to 6 months to reassess LVEF. Low sodium diet, fluid restriction <2L, and daily weights encouraged. Educated to contact our office for weight gain of 2 lbs overnight or 5 lbs in one week.  ?Provided note he may return to work on light duty 07/17/2021.  Increase to full duty 07/31/2021. ? ?HLD - Continue Crestor 20mg  QD. Denies myalgias. Plan to repeat lipid panel at follow up in 2 mos.  ? ?HTN - BP not at goal. As  detailed above stop olmesartan-HCTZ and start Entresto 49 -51 mg twice daily.  BMP in 2 weeks for monitoring.  His wife who is a nurse will assist him to monitor blood pressure at home.  He will report BP consisten

## 2021-07-15 ENCOUNTER — Encounter (HOSPITAL_BASED_OUTPATIENT_CLINIC_OR_DEPARTMENT_OTHER): Payer: Self-pay | Admitting: Family

## 2021-07-15 ENCOUNTER — Ambulatory Visit (INDEPENDENT_AMBULATORY_CARE_PROVIDER_SITE_OTHER): Payer: BC Managed Care – PPO | Admitting: Family

## 2021-07-15 VITALS — BP 162/102 | HR 63 | Ht 74.0 in | Wt 212.3 lb

## 2021-07-15 DIAGNOSIS — I5022 Chronic systolic (congestive) heart failure: Secondary | ICD-10-CM

## 2021-07-15 DIAGNOSIS — Z720 Tobacco use: Secondary | ICD-10-CM

## 2021-07-15 DIAGNOSIS — I480 Paroxysmal atrial fibrillation: Secondary | ICD-10-CM | POA: Diagnosis not present

## 2021-07-15 DIAGNOSIS — D6859 Other primary thrombophilia: Secondary | ICD-10-CM

## 2021-07-15 DIAGNOSIS — E782 Mixed hyperlipidemia: Secondary | ICD-10-CM | POA: Diagnosis not present

## 2021-07-15 DIAGNOSIS — I5020 Unspecified systolic (congestive) heart failure: Secondary | ICD-10-CM

## 2021-07-15 HISTORY — DX: Unspecified systolic (congestive) heart failure: I50.20

## 2021-07-15 MED ORDER — ENTRESTO 49-51 MG PO TABS: 1.0000 | ORAL_TABLET | Freq: Two times a day (BID) | ORAL | 2 refills | Status: DC

## 2021-07-15 NOTE — Patient Instructions (Addendum)
Medication Instructions:  ?Your physician has recommended you make the following change in your medication:   ? ?STOP Olmesartan-HCTZ ? ?START Entresto (Sacubitril-Valsartan) 49-51mg  one tablet twice daily ? ?*This medicine will help to strengthen your heart muscle and keep your blood pressure controlled.  ? ?*If you need a refill on your cardiac medications before your next appointment, please call your pharmacy* ? ? ?Lab Work: ?Your physician recommends that you return for lab work today: BMP ? ?Your physician recommends that you return for lab work in 2 weeks for BMP, CBC.  ? ?Please return for Lab work. You may come to the...  ? ?[facility] (3rd floor) ?[address] [place], [place]  ?Open: 8am-Noon and 1pm-4:30pm  ? ?[place] at [facility] ?[place]  ? ?[employer]- Any location ? ?**no appointments needed**  ? ?If you have labs (blood work) drawn today and your tests are completely normal, you will receive your results only by: ?MyChart Message (if you have MyChart) OR ?A paper copy in the mail ?If you have any lab test that is abnormal or we need to change your treatment, we will call you to review the results. ? ?Testing/Procedures: ?Your echocardiogram in the hospital showed your heart pumping function was 45-50%. The normal is 50-65%. The medication changes we have made today will help to strengthen the heart pumping function. We will plan to do another echocardiogram in 3-6 months to recheck your heart pumping function.  ? ?Follow-Up: ?At [facility], you and your health needs are our priority.  As part of our continuing mission to provide you with exceptional heart care, we have created designated Provider Care Teams.  These Care Teams include your primary Cardiologist (physician) and Advanced Practice Providers (APPs -  Physician Assistants and Nurse Practitioners) who all work together to provide you with the care you need, when you need  it. ? ?We recommend signing up for the patient portal called "MyChart".  Sign up information is provided on this After Visit Summary.  MyChart is used to connect with patients for Virtual Visits (Telemedicine).  Patients are able to view lab/test results, encounter notes, upcoming appointments, etc.  Non-urgent messages can be sent to your provider as well.   ?To learn more about what you can do with MyChart, go to [email or web address].   ? ?Your next appointment:   ?In 2 months with Loel Dubonnet, NP ?AND ?In 4-6 months with Dr. Gasper Sells  ? ?Other Instructions ? ?Please call our office if your blood pressure is consistently more than 130/80.  ? ?Heart Healthy Diet Recommendations: ?A low-salt diet is recommended. Meats should be grilled, baked, or boiled. Avoid fried foods. Focus on lean protein sources like fish or chicken with vegetables and fruits. The American Heart Association is a Microbiologist!  American Heart Association Diet and Lifeystyle Recommendations   ?Recommend drinking less than 2 liters of fluid (64 oz) per day. This heolps prevent you from holding onto fluid. ? ?Exercise recommendations: ?The American Heart Association recommends 150 minutes of moderate intensity exercise weekly. ?Try 30 minutes of moderate intensity exercise 4-5 times per week. ?This could include walking, jogging, or swimming. ? ? ?Important Information About Sugar ? ? ? ? ?  ?

## 2021-07-16 ENCOUNTER — Telehealth (HOSPITAL_BASED_OUTPATIENT_CLINIC_OR_DEPARTMENT_OTHER): Payer: Self-pay

## 2021-07-16 LAB — BASIC METABOLIC PANEL
BUN/Creatinine Ratio: 11 (ref 10–24)
BUN: 12 mg/dL (ref 8–27)
CO2: 27 mmol/L (ref 20–29)
Calcium: 9.8 mg/dL (ref 8.6–10.2)
Chloride: 104 mmol/L (ref 96–106)
Creatinine, Ser: 1.14 mg/dL (ref 0.76–1.27)
Glucose: 105 mg/dL — ABNORMAL HIGH (ref 70–99)
Potassium: 5.1 mmol/L (ref 3.5–5.2)
Sodium: 142 mmol/L (ref 134–144)
eGFR: 72 mL/min/{1.73_m2} (ref >59)

## 2021-07-16 NOTE — Telephone Encounter (Addendum)
Results called to patient who verbalizes understanding!  ? ? ? ?----- Message from Alver Sorrow, NP sent at 07/16/2021  8:07 AM EDT ----- ?Kidney function has returned to normal. Great result! ?

## 2021-07-24 ENCOUNTER — Ambulatory Visit (INDEPENDENT_AMBULATORY_CARE_PROVIDER_SITE_OTHER): Payer: BC Managed Care – PPO | Admitting: Internal Medicine

## 2021-07-24 ENCOUNTER — Other Ambulatory Visit: Payer: Self-pay

## 2021-07-24 ENCOUNTER — Encounter: Payer: Self-pay | Admitting: Internal Medicine

## 2021-07-24 VITALS — BP 157/97 | HR 72 | Temp 97.6°F | Ht 74.0 in | Wt 213.2 lb

## 2021-07-24 DIAGNOSIS — I428 Other cardiomyopathies: Secondary | ICD-10-CM | POA: Diagnosis not present

## 2021-07-24 DIAGNOSIS — I480 Paroxysmal atrial fibrillation: Secondary | ICD-10-CM | POA: Insufficient documentation

## 2021-07-24 DIAGNOSIS — I5021 Acute systolic (congestive) heart failure: Secondary | ICD-10-CM

## 2021-07-24 NOTE — Assessment & Plan Note (Signed)
Recent hospitalization for A-fib with RVR with associated nonischemic acute systolic congestive heart failure.  Has been started on metoprolol and Xarelto he is taking both of these without adverse effect.  He is in normal sinus rhythm today and feeling much better. ?

## 2021-07-24 NOTE — Assessment & Plan Note (Signed)
Recently changed from olmesartan HCTZ to moderate dose Entresto.  Blood pressure is not yet to goal anticipate he will need escalation to goal dose of 97 103, will go ahead and check a BMP today and pending those results we will reach out to cardiology about dose escalation given its been about 2 weeks since he was initiated.  He did bring all of his medications with him today I do feel that he has been taking them ?

## 2021-07-24 NOTE — Patient Instructions (Signed)
Antonio Ramos, I am checking your blood work.  Will discuss with your cardiology team about increasing the dose of your entresto to get blood pressure better. ?

## 2021-07-24 NOTE — Progress Notes (Signed)
?  Subjective:  ?HPI: ?[name] is a 61 y.o. male who presents for HFU for Afib, CHF ? ?He recently presented to the hospital for shortness of breath he was found to be in A-fib with RVR with an elevated troponin and work-up for this he received a coronary CT angiogram that did not reveal any ischemic blockages, he also received an echocardiogram that revealed a reduced ejection fraction and elevated left ventricular filling pressure.  Is also notable to have an elevated BNP.  Since his hospitalization he is already followed up with cardiology he has been started on Entresto almost 2 weeks ago as well as Eliquis and metoprolol.  His blood pressure is improved from the last visit but above goal.  His shortness of breath has resolved and feels like he is getting back to normal. ? ?Please see Assessment and Plan below for the status of his chronic medical problems. ? ?Objective:  ?Physical Exam: ?Vitals:  ? 07/24/21 0853  ?BP: (!) 157/97  ?Pulse: 72  ?Temp: 97.6 ?F (36.4 ?C)  ?TempSrc: Oral  ?SpO2: 97%  ?Weight: 213 lb 3.2 oz (96.7 kg)  ?Height: 6\' 2"  (1.88 m)  ? ?Body mass index is 27.37 kg/m? ?Physical Exam ?Vitals and nursing note reviewed.  ?Constitutional:   ?   Appearance: Normal appearance.  ?Cardiovascular:  ?   Rate and Rhythm: Normal rate and regular rhythm.  ?   Pulses: Normal pulses.  ?Pulmonary:  ?   Effort: Pulmonary effort is normal.  ?   Breath sounds: Normal breath sounds.  ?Neurological:  ?   Mental Status: He is alert.  ?Psychiatric:     ?   Mood and Affect: Mood normal.  ? ?Assessment & Plan:  ?See Encounters Tab for problem based charting. ? ?Medications Ordered ?No orders of the defined types were placed in this encounter. ? ?Other Orders ?Orders Placed This Encounter  ?Procedures  ? BMP8+Anion Gap  ? ?Follow Up: ?Return in about 3 months (around 10/23/2021). ? ?

## 2021-07-25 ENCOUNTER — Telehealth (HOSPITAL_BASED_OUTPATIENT_CLINIC_OR_DEPARTMENT_OTHER): Payer: Self-pay | Admitting: Family

## 2021-07-25 DIAGNOSIS — I5022 Chronic systolic (congestive) heart failure: Secondary | ICD-10-CM

## 2021-07-25 LAB — BMP8+ANION GAP
Anion Gap: 14 mmol/L (ref 10.0–18.0)
BUN/Creatinine Ratio: 16 (ref 10–24)
BUN: 16 mg/dL (ref 8–27)
CO2: 21 mmol/L (ref 20–29)
Calcium: 9.6 mg/dL (ref 8.6–10.2)
Chloride: 105 mmol/L (ref 96–106)
Creatinine, Ser: 0.99 mg/dL (ref 0.76–1.27)
Glucose: 87 mg/dL (ref 70–99)
Potassium: 4.5 mmol/L (ref 3.5–5.2)
Sodium: 140 mmol/L (ref 134–144)
eGFR: 86 mL/min/{1.73_m2} (ref >59)

## 2021-07-25 MED ORDER — ENTRESTO 97-103 MG PO TABS: 1.0000 | ORAL_TABLET | Freq: Two times a day (BID) | ORAL | 5 refills | Status: DC

## 2021-07-25 NOTE — Telephone Encounter (Signed)
RN called patient and made him aware of the following recommendations.  ? ? ? ? ?"Received staff message from patient's PCP regarding his medications. Seen by PCP 07/25/21 with BP 157/97. BMP at that time wiht normal kidney function and electrolytes.  He was compliant with all of his medications.  As such, we will plan to increase his Entresto to 97-103 mg twice daily.  He may use up his current tablets by taking 2 tablets twice per day. Rx sent to his pharmacy. Will ask nursing team to call him and make him aware. BMP in 2 weeks for monitoring.  ?  ?Alver Sorrow, NP " ?

## 2021-07-25 NOTE — Telephone Encounter (Signed)
Received staff message from patient's PCP regarding his medications. Seen by PCP 07/25/21 with BP 157/97. BMP at that time wiht normal kidney function and electrolytes.  He was compliant with all of his medications.  As such, we will plan to increase his Entresto to 97-103 mg twice daily.  He may use up his current tablets by taking 2 tablets twice per day. Rx sent to his pharmacy. Will ask nursing team to call him and make him aware. BMP in 2 weeks for monitoring.  ? ?Alver Sorrow, NP  ?

## 2021-07-30 ENCOUNTER — Telehealth: Payer: Self-pay | Admitting: *Deleted

## 2021-07-30 NOTE — Telephone Encounter (Signed)
SPOKE WITH Antonio Ramos REGARDING HIS MEDICATION COST/ PER DR. HOFFMAN HE IS TO CALL HIS CARDIOLOGIST AND MAY BE GET MEDICATION ASSIST TO HELP WITH THE COST. PATIENT STATES HE HAS LEFT VOICE MESSAGES FOR CALL BACK ALREADY. ?

## 2021-08-08 ENCOUNTER — Telehealth (HOSPITAL_BASED_OUTPATIENT_CLINIC_OR_DEPARTMENT_OTHER): Payer: Self-pay | Admitting: Family

## 2021-08-08 NOTE — Telephone Encounter (Signed)
New Message: ? ? ? ?Patient wants to know when is he supposed to come in for his lab work? ?

## 2021-08-08 NOTE — Telephone Encounter (Signed)
Reviewed with patient that he can come get labs today or Monday and Tuesday. He states he will come Monday or Tuesday  ?

## 2021-08-12 LAB — CBC
Hematocrit: 43 % (ref 37.5–51.0)
Hemoglobin: 14.2 g/dL (ref 13.0–17.7)
MCH: 27.7 pg (ref 26.6–33.0)
MCHC: 33 g/dL (ref 31.5–35.7)
MCV: 84 fL (ref 79–97)
Platelets: 204 10*3/uL (ref 150–450)
RBC: 5.13 x10E6/uL (ref 4.14–5.80)
RDW: 13.4 % (ref 11.6–15.4)
WBC: 5.1 10*3/uL (ref 3.4–10.8)

## 2021-08-12 LAB — BASIC METABOLIC PANEL
BUN/Creatinine Ratio: 9 — ABNORMAL LOW (ref 10–24)
BUN: 10 mg/dL (ref 8–27)
CO2: 24 mmol/L (ref 20–29)
Calcium: 9.8 mg/dL (ref 8.6–10.2)
Chloride: 104 mmol/L (ref 96–106)
Creatinine, Ser: 1.07 mg/dL (ref 0.76–1.27)
Glucose: 100 mg/dL — ABNORMAL HIGH (ref 70–99)
Potassium: 4.4 mmol/L (ref 3.5–5.2)
Sodium: 141 mmol/L (ref 134–144)
eGFR: 78 mL/min/{1.73_m2} (ref >59)

## 2021-08-13 ENCOUNTER — Telehealth (HOSPITAL_BASED_OUTPATIENT_CLINIC_OR_DEPARTMENT_OTHER): Payer: Self-pay

## 2021-08-13 NOTE — Telephone Encounter (Addendum)
Results called to patient who verbalizes understanding!  ? ? ? ? ?----- Message from Ronney Asters, NP sent at 08/12/2021  7:01 AM EDT ----- ?Please contact Antonio Ramos and let him know that his lab work from yesterday has been reviewed.  It is normal for him at this time.  No further testing is needed.  We will continue with his current medication regimen.  Thank you. ?

## 2021-08-27 ENCOUNTER — Other Ambulatory Visit: Payer: Self-pay

## 2021-08-27 ENCOUNTER — Emergency Department (HOSPITAL_BASED_OUTPATIENT_CLINIC_OR_DEPARTMENT_OTHER): Payer: BC Managed Care – PPO | Admitting: Radiology

## 2021-08-27 ENCOUNTER — Emergency Department (HOSPITAL_BASED_OUTPATIENT_CLINIC_OR_DEPARTMENT_OTHER)
Admission: EM | Admit: 2021-08-27 | Discharge: 2021-08-27 | Disposition: A | Discharge status: Home / Self Care | Payer: BC Managed Care – PPO | Attending: Emergency Medicine | Admitting: Emergency Medicine

## 2021-08-27 DIAGNOSIS — M79672 Pain in left foot: Secondary | ICD-10-CM | POA: Insufficient documentation

## 2021-08-27 DIAGNOSIS — Z5321 Procedure and treatment not carried out due to patient leaving prior to being seen by health care provider: Secondary | ICD-10-CM | POA: Insufficient documentation

## 2021-08-27 NOTE — ED Triage Notes (Signed)
Pt arrived POV c/o left anterior foot pain started today. Pt states he walks a lot at work. Denies injury.

## 2021-09-18 ENCOUNTER — Encounter (HOSPITAL_BASED_OUTPATIENT_CLINIC_OR_DEPARTMENT_OTHER): Payer: Self-pay | Admitting: Family

## 2021-09-18 ENCOUNTER — Ambulatory Visit (INDEPENDENT_AMBULATORY_CARE_PROVIDER_SITE_OTHER): Payer: BC Managed Care – PPO | Admitting: Family

## 2021-09-18 VITALS — BP 144/90 | HR 71 | Ht 72.0 in | Wt 210.0 lb

## 2021-09-18 DIAGNOSIS — I10 Essential (primary) hypertension: Secondary | ICD-10-CM

## 2021-09-18 DIAGNOSIS — D6859 Other primary thrombophilia: Secondary | ICD-10-CM

## 2021-09-18 DIAGNOSIS — I480 Paroxysmal atrial fibrillation: Secondary | ICD-10-CM | POA: Diagnosis not present

## 2021-09-18 DIAGNOSIS — Z720 Tobacco use: Secondary | ICD-10-CM

## 2021-09-18 DIAGNOSIS — I5022 Chronic systolic (congestive) heart failure: Secondary | ICD-10-CM

## 2021-09-18 MED ORDER — CARVEDILOL 12.5 MG PO TABS: 12.5000 mg | ORAL_TABLET | Freq: Two times a day (BID) | ORAL | 3 refills | Status: DC

## 2021-09-18 NOTE — Progress Notes (Signed)
Office Visit    Patient Name: [name] Date of Encounter: 09/18/2021  PCP:  [name], [place]  Cardiologist:  Werner Lean, MD  Advanced Practice Provider:  No care team member to display Electrophysiologist:  None    Chief Complaint    Antonio Ramos is a 61 y.o. male with a hx of NSTEMI (no coronary disease), HTN, tobacco use, HLD, NICM, systolic heart failure, COPD presents today for follow-up of heart failure and atrial fibrillation  Past Medical History    Past Medical History:  Diagnosis Date   Atrial fibrillation ([place]) 07/2021   Blood transfusion without reported diagnosis    Hemorrhagic shock ([place]) 11/23/2012   HFrEF (heart failure with reduced ejection fraction) ([place]) 07/15/2021   (a) 07/2021 LVEF 45%   Hyperlipidemia    Hypertension    MI (myocardial infarction) ([place])    per patient report, unconfimred   PUD (peptic ulcer disease)    Diagnosed in 1986, with 3 prior bleeds since that time, most recently in 2004   Tobacco use    Past Surgical History:  Procedure Laterality Date   ESOPHAGOGASTRODUODENOSCOPY N/A 11/23/2012   Procedure: ESOPHAGOGASTRODUODENOSCOPY (EGD);  Surgeon: Jeryl Columbia, MD;  Location: [facility] ENDOSCOPY;  Service: Endoscopy;  Laterality: N/A;   UPPER GI ENDOSCOPY      Allergies  No Known Allergies  History of Present Illness    [name] is a 61 y.o. male with a hx of NSTEMI (no coronary disease), HTN, tobacco use, HLD, NICM, systolic heart failure, COPD last seen 07/15/2021  He was admitted 07/05/21 after presenting with tachycardia, chest pain, dyspnea. He was found to be in atrial fibrillation with anterolateral ST depression and TWI with troponin 900. He converted to NSR with Cardizem drip. This was subsequently discontinued as echo revealed LVEF 45%. He declined cardiac cath so cardiac CT performed revealing no evidence of CAD. He wa started on Crestor prior to discharge.   Last seen  07/11/2021.  He was feeling well since hospital discharge.  For optimization of GDMT olmesartan-HCTZ was discontinued and Entresto 49 to 51 mg twice daily initiated.  He presents today for follow up. 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 Shares with me that his wife's daughter is expecting a baby in July and he is excited. 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 Has retired from [employer].  His wife is a Marine scientist and checks his BP manually at home with readings similar to what we got in clinic today.  Denies palpitations, chest pain, dyspnea, edema, orthopnea, PND.  EKGs/Labs/Other Studies Reviewed:   The following studies were reviewed today:   CT coronary 07/07/21: IMPRESSION: 1. Coronary calcium score of 0. This was 0 percentile for age-, race-, and sex-matched controls.   2. 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 Right dominance.   3. No evidence of CAD. _____________   Echo 07/06/21:  1. Left ventricular ejection fraction, by estimation, is 45 to 50%. The  left ventricle has mildly decreased function. The left ventricle  demonstrates global hypokinesis. There is moderate concentric left  ventricular hypertrophy. Left ventricular  diastolic parameters are consistent with Grade II diastolic dysfunction  (pseudonormalization). Elevated left atrial pressure.   2. Right ventricular systolic function is normal. The right ventricular  size is normal. Tricuspid regurgitation  signal is inadequate for assessing  PA pressure.   3. Left atrial size was mildly dilated.   4. The mitral valve is abnormal. Mild to moderate mitral valve  regurgitation. No evidence of mitral stenosis.   5. The aortic valve is tricuspid. Aortic valve regurgitation is not  visualized. No aortic stenosis is present.   EKG: No EKG due today  Recent  Labs: 07/05/2021: Magnesium 2.1 07/06/2021: TSH 4.442 08/11/2021: BUN 10; Creatinine, Ser 1.07; Hemoglobin 14.2; Platelets 204; Potassium 4.4; Sodium 141  Recent Lipid Panel    Component Value Date/Time   CHOL 185 07/06/2021 0743   CHOL 230 (H) 01/23/2021 0935   TRIG 100 07/06/2021 0743   HDL 36 (L) 07/06/2021 0743   HDL 45 01/23/2021 0935   CHOLHDL 5.1 07/06/2021 0743   VLDL 20 07/06/2021 0743   LDLCALC 129 (H) 07/06/2021 0743   LDLCALC 159 (H) 01/23/2021 0935    Risk Assessment/Calculations:   CHA2DS2-VASc Score = 2   This indicates a 2.2% annual risk of stroke. The patient's score is based upon: CHF History: 1 HTN History: 1 Diabetes History: 0 Stroke History: 0 Vascular Disease History: 0 Age Score: 0 Gender Score: 0     Home Medications   Current Meds  Medication Sig   apixaban (ELIQUIS) 5 MG TABS tablet Take 1 tablet (5 mg total) by mouth 2 (two) times daily.   carvedilol (COREG) 12.5 MG tablet Take 1 tablet (12.5 mg total) by mouth 2 (two) times daily.   rosuvastatin (CRESTOR) 20 MG tablet Take 1 tablet (20 mg total) by mouth daily.   sacubitril-valsartan (ENTRESTO) 97-103 MG Take 1 tablet by mouth 2 (two) times daily.   [DISCONTINUED] metoprolol succinate (TOPROL-XL) 50 MG 24 hr tablet Take 1 tablet (50 mg total) by mouth daily. Take with or immediately following a meal.     Review of Systems      All other systems reviewed and are otherwise negative except as noted above.  Physical Exam    VS:  BP (!) 144/90   Pulse 71   Ht 6' (1.829 m)   Wt 210 lb (95.3 kg)   BMI 28.48 kg/m  , BMI Body mass index is 28.48 kg/m.  Wt Readings from Last 3 Encounters:  09/18/21 210 lb (95.3 kg)  08/27/21 211 lb (95.7 kg)  07/24/21 213 lb 3.2 oz (96.7 kg)    GEN: Well nourished, well developed, in no acute distress. HEENT: normal. Neck: Supple, no JVD, carotid bruits, or masses. Cardiac: RRR, no murmurs, rubs, or gallops. No clubbing, cyanosis, edema.  Radials/PT 2+  and equal bilaterally.  Respiratory:  Respirations regular and unlabored, clear to auscultation bilaterally. GI: Soft, nontender, nondistended. MS: No deformity or atrophy. Skin: Warm and dry, no rash. Neuro:  Strength and sensation are intact. Psych: Normal affect.  Assessment & Plan    Atrial fibrillation / Hypercoagulable state -maintaining sinus by auscultation today.  Echo 07/2021 with no significant valvular abnormalities.  Lab work 07/2021 normal TSH, electrolytes.  Continue liquis 5 mg twice daily.  Denies bleeding complications.  . CHA2DS2-VASc Score = 2 [CHF History: 1, HTN History: 1, Diabetes History: 0, Stroke History: 0, Vascular Disease History: 0, Age Score: 0, Gender Score: 0].  Therefore, the patient's annual risk of stroke is 2.2 %.    As BP is not at goal we will stop metoprolol and start carvedilol 12.5 mg twice daily.  They have class at nights at heart  CHF - LVEF 45%. Euvolemic  and well compensated on exam.  NYHA I. For optimization of heart failure therapy as well as blood pressure control stop Metoprolol and start Carvedilol 12.5mg  BID. He is not requiring loop diuretic at this time.  Future considerations include MRA/SGLT2i though he is hesitant regarding additional medications. Anticipate LVEF depressed in the setting of atrial fibrillation.  Cardiac CTA 07/07/2021 with coronary artery calcium score of 0.  Consider ordering repeat echo at follow up. . Low sodium diet, fluid restriction <2L, and daily weights encouraged. Educated to contact our office for weight gain of 2 lbs overnight or 5 lbs in one week.    HLD - Continue Crestor 20mg  QD. Denies myalgias.  HTN - BP not at goal. Continue Entresto 97-103mg  BID. Stop Metoprolol, start Carvedilol 12.5mg  BID.   COPD / Tobacco use - No sign of acute exacerbation. Smoking cessation encouraged. Recommend utilization of 1800QUITNOW.    Disposition: Follow up in August with September, MD   Signed, Christell Constant, NP 09/18/2021, 7:11 PM [facility]

## 2021-09-18 NOTE — Patient Instructions (Addendum)
Medication Instructions:  Your physician has recommended you make the following change in your medication:   STOP Metoprolol  START Carvedilol 12.5mg  one tablet twice daily  *We made this change to help get your blood pressure to less than 130/80   *If you need a refill on your cardiac medications before your next appointment, please call your pharmacy*   Lab Work: None ordered today.   Testing/Procedures: None ordered today.   Follow-Up: At [facility], you and your health needs are our priority.  As part of our continuing mission to provide you with exceptional heart care, we have created designated Provider Care Teams.  These Care Teams include your primary Cardiologist (physician) and Advanced Practice Providers (APPs -  Physician Assistants and Nurse Practitioners) who all work together to provide you with the care you need, when you need it.  We recommend signing up for the patient portal called "MyChart".  Sign up information is provided on this After Visit Summary.  MyChart is used to connect with patients for Virtual Visits (Telemedicine).  Patients are able to view lab/test results, encounter notes, upcoming appointments, etc.  Non-urgent messages can be sent to your provider as well.   To learn more about what you can do with MyChart, go to [email or web address].    Your next appointment:   As scheduled with Dr. Izora Ribas   Other Instructions  Heart Healthy Diet Recommendations: A low-salt diet is recommended. Meats should be grilled, baked, or boiled. Avoid fried foods. Focus on lean protein sources like fish or chicken with vegetables and fruits. The American Heart Association is a Chief Technology Officer!  American Heart Association Diet and Lifeystyle Recommendations   Exercise recommendations: The American Heart Association recommends 150 minutes of moderate intensity exercise weekly. Try 30 minutes of moderate intensity exercise 4-5 times per week. This could  include walking, jogging, or swimming.   Important Information About Sugar

## 2021-11-19 ENCOUNTER — Ambulatory Visit (INDEPENDENT_AMBULATORY_CARE_PROVIDER_SITE_OTHER): Payer: BC Managed Care – PPO | Admitting: Internal Medicine

## 2021-11-19 ENCOUNTER — Encounter: Payer: Self-pay | Admitting: Internal Medicine

## 2021-11-19 VITALS — BP 130/80 | HR 69 | Ht 74.0 in | Wt 208.0 lb

## 2021-11-19 DIAGNOSIS — I480 Paroxysmal atrial fibrillation: Secondary | ICD-10-CM | POA: Diagnosis not present

## 2021-11-19 DIAGNOSIS — I10 Essential (primary) hypertension: Secondary | ICD-10-CM | POA: Diagnosis not present

## 2021-11-19 DIAGNOSIS — I5020 Unspecified systolic (congestive) heart failure: Secondary | ICD-10-CM | POA: Insufficient documentation

## 2021-11-19 NOTE — Progress Notes (Signed)
Cardiology Office Note:    Date:  11/19/2021   ID:  Antonio Ramos, DOB 25-Aug-1960, MRN [id number]  PCP:  Gust Rung, DO   [facility] Providers Cardiologist:  Christell Constant, MD     Referring MD: Gust Rung, DO   CC: HF f/u  History of Present Illness:    Antonio Ramos is a 61 y.o. male with a hx of NSTEMI 2023 with demand ischemia; HLD, NICM, HTN, tobacco abuse and COPD who presents for HF and PAF.  Patient notes that he is doing well.   Since last visit notes that he has stopped smoking. . There are no interval hospital/ED visit.    No chest pain or pressure .  No SOB/DOE and no PND/Orthopnea.  No weight gain or leg swelling.  No palpitations or syncope . 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   Ambulatory blood pressure is done but he has forget to bring his reading in today.  He notes labile blood pressure; he lost power at his house.  Past Medical History:  Diagnosis Date   Atrial fibrillation (HCC) 07/2021   Blood transfusion without reported diagnosis    Hemorrhagic shock (HCC) 11/23/2012   HFrEF (heart failure with reduced ejection fraction) (HCC) 07/15/2021   (a) 07/2021 LVEF 45%   Hyperlipidemia    Hypertension    MI (myocardial infarction) (HCC)    per patient report, unconfimred   PUD (peptic ulcer disease)    Diagnosed in 1986, with 3 prior bleeds since that time, most recently in 2004   Tobacco use     Past Surgical History:  Procedure Laterality Date   ESOPHAGOGASTRODUODENOSCOPY N/A 11/23/2012   Procedure: ESOPHAGOGASTRODUODENOSCOPY (EGD);  Surgeon: Petra Kuba, MD;  Location: [facility] END[facility]COPY;  Service: Endoscopy;  Laterality: N/A;   UPPER GI ENDOSCOPY      Current Medications: Current Meds  Medication Sig   apixaban (ELIQUIS) 5 MG TABS tablet Take 1 tablet (5 mg total) by mouth 2 (two) times daily.   carvedilol (COREG) 12.5 MG tablet Take 1 tablet (12.5 mg total) by mouth 2 (two) times daily.   rosuvastatin (CRESTOR) 20  MG tablet Take 1 tablet (20 mg total) by mouth daily.   sacubitril-valsartan (ENTRESTO) 97-103 MG Take 1 tablet by mouth 2 (two) times daily.     Allergies:   Patient has no known allergies.   Social History   Socioeconomic History   Marital status: Married    Spouse name: Not on file   Number of children: Not on file   Years of education: Not on file   Highest education level: Not on file  Occupational History   Not on file  Tobacco Use   Smoking status: Former    Packs/day: 0.20    Years: 24.00    Total pack years: 4.80    Types: Cigarettes    Quit date: 03/17/2014    Years since quitting: 7.6   Smokeless tobacco: Never  Substance and Sexual Activity   Alcohol use: No    Alcohol/week: 1.0 standard drink of alcohol    Types: 1 Standard drinks or equivalent per week    Comment: states used to drink heavily in 1983   Drug use: No   Sexual activity: Not on file  Other Topics Concern   Not on file  Social History Narrative   Moved from [place] to [place] in 2009.  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 He is [facility] service connected  Family History: The patient's family history includes Heart attack in his father.  ROS:   Please see the history of present illness.     All other systems reviewed and are negative.  EKGs/Labs/Other Studies Reviewed:    The following studies were reviewed today:   EKG:  EKG is  ordered today.  The ekg ordered today demonstrates  11/19/21: SR with LVH with secondary repolarization and PACs   ECHO COMPLETE WO IMAGING ENHANCING AGENT 07/06/2021  Narrative ECHOCARDIOGRAM REPORT    Patient Name:    Antonio Ramos Date of Exam: 07/06/2021 Medical Rec #:  [id number]   Height:       74.0 in Accession #:    [id number]  Weight:       210.5 lb Date of Birth:  Nov 20, 1960   BSA:          2.221 m Patient Age:    61 years    BP:           135/97 mmHg Patient Gender: M           HR:           68 bpm. Exam Location:  Inpatient  Procedure: 2D Echo, Cardiac Doppler, Color Doppler and 3D Echo  Indications:    Acute myocardial infarction  History:        Patient has no prior history of Echocardiogram examinations. COPD; Risk Factors:Hypertension, Current Smoker and Dyslipidemia.  Sonographer:    Ross Ludwig RDCS (AE) Referring Phys: [id number] [facility]   Sonographer Comments: Suboptimal subcostal window. IMPRESSIONS   1. Left ventricular ejection fraction, by estimation, is 45 to 50%. The left ventricle has mildly decreased function. The left ventricle demonstrates global hypokinesis. There is moderate concentric left ventricular hypertrophy. Left ventricular diastolic parameters are consistent with Grade II diastolic dysfunction (pseudonormalization). Elevated left atrial pressure. 2. Right ventricular systolic function is normal. The right ventricular size is normal. Tricuspid regurgitation signal is inadequate for assessing PA pressure. 3. Left atrial size was mildly dilated. 4. The mitral valve is abnormal. Mild to moderate mitral valve regurgitation. No evidence of mitral stenosis. 5. The aortic valve is tricuspid. Aortic valve regurgitation is not visualized. No aortic stenosis is present.  Comparison(s): No prior Echocardiogram.      CT CORONARY MORPH W/CTA COR W/SCORE W/CA W/CM &/OR WO/CM 07/07/2021  Addendum 07/07/2021 11:38 AM ADDENDUM REPORT: 07/07/2021 11:36  CLINICAL DATA:  83M with systolic heart failure, tobacco abuse, PAS, and elevated cardiac enzymes.  EXAM: Cardiac/Coronary  CT  TECHNIQUE: The patient was scanned on a [employer].  FINDINGS: A 120 kV  prospective scan was triggered in the descending thoracic aorta at 111 HU's. Axial non-contrast 3 mm slices were carried out through the heart. The data set was analyzed on a dedicated work station and scored using the Agatson method. Gantry rotation speed was 250 msecs and collimation was .6 mm. No beta blockade and 0.8 mg of sl NTG was given. The 3D data set was reconstructed in 5% intervals of the 67-82 % of the R-R cycle. Diastolic phases were analyzed on a dedicated  work station using MPR, MIP and VRT modes. The patient received 80 cc of contrast.  Aorta: Normal size.  No calcifications.  No dissection.  Aortic Valve:  Trileaflet.  No calcifications.  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 Right dominance.  RCA is a large dominant artery that gives rise to PDA and PLVB. There is no plaque.  Left main is a large artery that gives rise to LAD, RI, and LCX arteries.  LAD is a large vessel that has no plaque. There is a large D1 and a small D2 without plaque.  RI is a large vessel with no plaque.  LCX is a non-dominant artery that gives rise to a tiny OM1 large OM1 branch, and small OM3. There is no plaque.  Coronary Calcium Score: 0  Percentile: 0  Other findings:  Normal pulmonary vein drainage into the left atrium.  Normal let atrial appendage without a thrombus.  Normal size of the pulmonary artery.  IMPRESSION: 1. Coronary calcium score of 0. This was 0 percentile for age-, race-, and sex-matched controls.  2. 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 Right dominance.  3. No evidence of CAD.  Skeet Latch, MD   Electronically Signed By: Skeet Latch M.D. On: 07/07/2021 11:36       Recent Labs: 07/05/2021: Magnesium 2.1 07/06/2021: TSH 4.442 08/11/2021: BUN 10; Creatinine, Ser  1.07; Hemoglobin 14.2; Platelets 204; Potassium 4.4; Sodium 141  Recent Lipid Panel    Component Value Date/Time   CHOL 185 07/06/2021 0743   CHOL 230 (H) 01/23/2021 0935   TRIG 100 07/06/2021 0743   HDL 36 (L) 07/06/2021 0743   HDL 45 01/23/2021 0935   CHOLHDL 5.1 07/06/2021 0743   VLDL 20 07/06/2021 0743   LDLCALC 129 (H) 07/06/2021 0743   LDLCALC 159 (H) 01/23/2021 0935       Physical Exam:    VS:  BP 130/80   Pulse 69   Ht 6\' 2"  (1.88 m)   Wt 208 lb (94.3 kg)   SpO2 97%   BMI 26.71 kg/m     Wt Readings from Last 3 Encounters:  11/19/21 208 lb (94.3 kg)  09/18/21 210 lb (95.3 kg)  08/27/21 211 lb (95.7 kg)    Gen: no distress   Neck: No JVD  Ears: No Pilar Plate Sign Cardiac: No Rubs or Gallops, no murmur, RRR +2 radial pulses Respiratory: Clear to auscultation bilaterally, normal effort, normal  respiratory rate GI: Soft, nontender, non-distended  MS: No  edema;  moves all extremities Integument: Skin feels warm Neuro:  At time of evaluation, alert and oriented to person/place/time/situation  Psych: Normal affect, patient feels well  ASSESSMENT:    1. HFrEF (heart failure with reduced ejection fraction) (HCC)   2. Paroxysmal atrial fibrillation ([place])   3. Essential hypertension    PLAN:     Heart Failure Mildly Reduced Ejection Fraction  PAF CHADSVASC 2 on Eliquis HTN - NYHA class I, Stage B, euvolemic, etiology is unclear - Diuretic regimen: Lasix PRN he does not keep this prescription with him  - Discussed the importance of fluid restriction of < 2 L, salt restriction, and checking daily weights   - Coreg 12.5 mg PO BID; I would like to increase to 25 mg PO BID; patient is not ameable to this - ARNI 97-103 dose tolerated - SGLT2i had  been deferred by patient  - aldactone/eplerenone may not be needed in the setting of LVEF above 40%  Given his LVH and unclear etiology of HF I would like to get a CMR; discussed this with patient he had deferred. Given hx  of AF and HF would like to get zio patch for Afib burden assessment (he is asymptomatic of it) he has deferred.  SDM::  Patient is amenable to getting a repeat echocardiogram; based on results would consider increase in coreg; he has noted difficulties with copays here and would like to get this done at the [place]: he will send up which providers we need to coordinate this with  He is considering transition to the [place] for cardiac care because of copays  - Device Indications: None - Clinical Trials/Barostim: Defer for now - Indication for AHF:  Defer for now   FomerTobacco Abuse  COPD - no sx still smoke free   I have offered 6 month f/u so he does not fall through the cracks during transition to new cardiologist       Medication Adjustments/Labs and Tests Ordered: Current medicines are reviewed at length with the patient today.  Concerns regarding medicines are outlined above.  Orders Placed This Encounter  Procedures   EKG 12-Lead   ECHOCARDIOGRAM COMPLETE   No orders of the defined types were placed in this encounter.   Patient Instructions  Medication Instructions:  Your physician recommends that you continue on your current medications as directed. Please refer to the Current Medication list given to you today.  *If you need a refill on your cardiac medications before your next appointment, please call your pharmacy*   Lab Work: NONE If you have labs (blood work) drawn today and your tests are completely normal, you will receive your results only by: [place] (if you have MyChart) OR A paper copy in the mail If you have any lab test that is abnormal or we need to change your treatment, we will call you to review the results.   Testing/Procedures: Your physician has requested that you have an echocardiogram. Echocardiography is a painless test that uses sound waves to create images of your heart. It provides your doctor with information about the size and shape of  your heart and how well your heart's chambers and valves are working. This procedure takes approximately one hour. There are no restrictions for this procedure.    Follow-Up: At [facility], you and your health needs are our priority.  As part of our continuing mission to provide you with exceptional heart care, we have created designated Provider Care Teams.  These Care Teams include your primary Cardiologist (physician) and Advanced Practice Providers (APPs -  Physician Assistants and Nurse Practitioners) who all work together to provide you with the care you need, when you need it.  We recommend signing up for the patient portal called "MyChart".  Sign up information is provided on this After Visit Summary.  MyChart is used to connect with patients for Virtual Visits (Telemedicine).  Patients are able to view lab/test results, encounter notes, upcoming appointments, etc.  Non-urgent messages can be sent to your provider as well.   To learn more about what you can do with MyChart, go to [email or web address].    Your next appointment:   6 month(s)  The format for your next appointment:   In Person  Provider:   Werner Lean, MD     Important Information About Sugar  Signed, Werner Lean, MD  11/19/2021 9:55 AM    [place]

## 2021-11-19 NOTE — Patient Instructions (Signed)
Medication Instructions:  Your physician recommends that you continue on your current medications as directed. Please refer to the Current Medication list given to you today.  *If you need a refill on your cardiac medications before your next appointment, please call your pharmacy*   Lab Work: NONE If you have labs (blood work) drawn today and your tests are completely normal, you will receive your results only by: MyChart Message (if you have MyChart) OR A paper copy in the mail If you have any lab test that is abnormal or we need to change your treatment, we will call you to review the results.   Testing/Procedures: Your physician has requested that you have an echocardiogram. Echocardiography is a painless test that uses sound waves to create images of your heart. It provides your doctor with information about the size and shape of your heart and how well your heart's chambers and valves are working. This procedure takes approximately one hour. There are no restrictions for this procedure.    Follow-Up: At [facility], you and your health needs are our priority.  As part of our continuing mission to provide you with exceptional heart care, we have created designated Provider Care Teams.  These Care Teams include your primary Cardiologist (physician) and Advanced Practice Providers (APPs -  Physician Assistants and Nurse Practitioners) who all work together to provide you with the care you need, when you need it.  We recommend signing up for the patient portal called "MyChart".  Sign up information is provided on this After Visit Summary.  MyChart is used to connect with patients for Virtual Visits (Telemedicine).  Patients are able to view lab/test results, encounter notes, upcoming appointments, etc.  Non-urgent messages can be sent to your provider as well.   To learn more about what you can do with MyChart, go to [email or web address].    Your next appointment:   6  month(s)  The format for your next appointment:   In Person  Provider:   Christell Constant, MD     Important Information About Sugar

## 2021-11-20 ENCOUNTER — Telehealth: Payer: Self-pay | Admitting: *Deleted

## 2021-11-20 NOTE — Telephone Encounter (Signed)
Call from pt who stated his wife was seen at her doctor's office and was told he needs to be treated for STD. Informed pt STID is a sexually transmitted disease. I asked pt to ask his wife which STI. He called back and stated Trichomonas. He wants to know if he needs to come in to be tested or can Dr Mikey Bussing prescribe medication?

## 2021-11-21 MED ORDER — METRONIDAZOLE 500 MG PO TABS: 2000.0000 mg | ORAL_TABLET | Freq: Once | ORAL | 0 refills | Status: AC

## 2021-11-21 NOTE — Telephone Encounter (Signed)
I sent [place] a prescription for the treatment which is a single dose of 2000mg  of metronidazole (4 tablets), he should use condoms if sexually active for at least the next week while infection is clearing.  I do need to have him come in at some point soon to do a full STD panel but its not terribly urgent.  I attempted to call but no answer, did not leave a message due to sensitive nature.

## 2021-11-21 NOTE — Telephone Encounter (Signed)
Attempted to relay info below to patient. No answer. No VM set up.

## 2021-11-24 NOTE — Telephone Encounter (Signed)
Pt has an appt already scheduled with Dr Mikey Bussing 12/04/21.

## 2021-12-02 ENCOUNTER — Ambulatory Visit (HOSPITAL_COMMUNITY): Payer: BC Managed Care – PPO | Attending: Cardiovascular Disease

## 2021-12-04 ENCOUNTER — Other Ambulatory Visit (HOSPITAL_COMMUNITY)
Admission: RE | Admit: 2021-12-04 | Discharge: 2021-12-04 | Disposition: A | Discharge status: Home / Self Care | Payer: BC Managed Care – PPO | Source: Ambulatory Visit | Attending: Internal Medicine | Admitting: Internal Medicine

## 2021-12-04 ENCOUNTER — Ambulatory Visit (INDEPENDENT_AMBULATORY_CARE_PROVIDER_SITE_OTHER): Payer: BC Managed Care – PPO | Admitting: Internal Medicine

## 2021-12-04 ENCOUNTER — Other Ambulatory Visit: Payer: Self-pay

## 2021-12-04 ENCOUNTER — Encounter: Payer: Self-pay | Admitting: Internal Medicine

## 2021-12-04 VITALS — BP 156/91 | HR 61 | Temp 98.1°F | Ht 74.0 in | Wt 210.8 lb

## 2021-12-04 DIAGNOSIS — Z7251 High risk heterosexual behavior: Secondary | ICD-10-CM

## 2021-12-04 DIAGNOSIS — I480 Paroxysmal atrial fibrillation: Secondary | ICD-10-CM

## 2021-12-04 DIAGNOSIS — I10 Essential (primary) hypertension: Secondary | ICD-10-CM

## 2021-12-04 DIAGNOSIS — I5020 Unspecified systolic (congestive) heart failure: Secondary | ICD-10-CM | POA: Diagnosis not present

## 2021-12-04 DIAGNOSIS — I110 Hypertensive heart disease with heart failure: Secondary | ICD-10-CM | POA: Diagnosis not present

## 2021-12-04 DIAGNOSIS — Z9189 Other specified personal risk factors, not elsewhere classified: Secondary | ICD-10-CM | POA: Diagnosis not present

## 2021-12-04 DIAGNOSIS — Z23 Encounter for immunization: Secondary | ICD-10-CM

## 2021-12-04 DIAGNOSIS — Z87891 Personal history of nicotine dependence: Secondary | ICD-10-CM

## 2021-12-04 MED ORDER — FUROSEMIDE 20 MG PO TABS: 20.0000 mg | ORAL_TABLET | Freq: Every day | ORAL | 2 refills | Status: DC | PRN

## 2021-12-04 NOTE — Assessment & Plan Note (Signed)
Wife had trichomoniasis, recently treated him.  Will check STD panel.

## 2021-12-04 NOTE — Progress Notes (Signed)
Established Patient Office Visit  Subjective   Patient ID: Antonio Ramos, male    DOB: Nov 22, 1960  Age: 61 y.o. MRN: [id number]  Chief Complaint  Patient presents with   Check-up Visit    No c/o's.   Overall feeling well. Saw cardiology 2 weeks ago, says copays for visits and tests too expensive especially since he feels pretty good.  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  Just had a new grandchild in July!    Objective:     BP (!) 156/91 (BP Location: Right Arm, Cuff Size: Normal)   Pulse 61   Temp 98.1 F (36.7 C) (Oral)   Ht 6' 2"  (1.88 m)   Wt 210 lb 12.8 oz (95.6 kg)   SpO2 99% Comment: RA  BMI 27.07 kg/m  BP Readings from Last 3 Encounters:  12/04/21 (!) 156/91  11/19/21 130/80  09/18/21 (!) 144/90   Wt Readings from Last 3 Encounters:  12/04/21 210 lb 12.8 oz (95.6 kg)  11/19/21 208 lb (94.3 kg)  09/18/21 210 lb (95.3 kg)      Physical Exam Vitals and nursing note reviewed.  Constitutional:      Appearance: Normal appearance.  Cardiovascular:     Rate and Rhythm: Normal rate and regular rhythm.  Pulmonary:     Effort: Pulmonary effort is normal.     Breath sounds: Normal breath sounds. No wheezing, rhonchi or rales.  Musculoskeletal:     Right lower leg: Edema (1+) present.     Left lower leg: Edema (trace) present.  Neurological:     Mental Status: He is alert.      No results found for any visits on 12/04/21.  Last CBC Lab Results  Component Value Date   WBC 5.1 08/11/2021   HGB 14.2 08/11/2021   HCT 43.0 08/11/2021   MCV 84 08/11/2021   MCH 27.7 08/11/2021   RDW 13.4 08/11/2021   PLT 204 09/62/8366   Last metabolic panel Lab Results  Component Value Date   GLUCOSE 100 (H) 08/11/2021   NA 141 08/11/2021   K 4.4 08/11/2021   CL 104 08/11/2021   CO2 24 08/11/2021   BUN 10 08/11/2021   CREATININE 1.07 08/11/2021   EGFR 78 08/11/2021   CALCIUM 9.8  08/11/2021   PROT 7.1 06/13/2020   ALBUMIN 4.5 06/13/2020   LABGLOB 2.6 06/13/2020   AGRATIO 1.7 06/13/2020   BILITOT <0.2 06/13/2020   ALKPHOS 89 06/13/2020   AST 25 06/13/2020   ALT 26 06/13/2020   ANIONGAP 6 07/07/2021      The ASCVD Risk score (Arnett DK, et al., 2019) failed to calculate for the following reasons:   The patient has a prior MI or stroke diagnosis    Assessment & Plan:   Problem List Items Addressed This Visit       Cardiovascular and Mediastinum   Essential hypertension (Chronic)    BP very high on intial check, repeat improved.  Brings medications however enteresto is from about 5-6 weeks ago and still has pills remaining.  He has only been taking once a day.  INcrease entresto to BID      Relevant Medications   furosemide (LASIX) 20 MG tablet   Paroxysmal atrial fibrillation (HCC)    No complaints  -COntinue eliquis 30m BID      Relevant Medications   furosemide (LASIX) 20 MG tablet   HFrEF (heart failure with  reduced ejection fraction) ([place]) - Primary    Overall euvolemic to slightly hypervolemic, he does not have a perscription for lasix.  Weight up 2 lbs since cards visit 2 weeks ago.  - Continue entresto 97-103 BID (was taking only once a day, I have written a 2 on this bottle as well for a reminder) - Continue Coreg 12.5 BID -Lasix 34m daily PRN       Relevant Medications   furosemide (LASIX) 20 MG tablet   Other Relevant Orders   BMP8+Anion Gap   Lipid Profile     Other   Risk for coronary artery disease between 10% and 20% in next 10 years per Framingham score (Chronic)    coronoary CTA without significant atherosclerosis, continue crestor 260mdaily, repeat lipid panel      High risk sexual behavior    Wife had trichomoniasis, recently treated him.  Will check STD panel.      Relevant Orders   Urine cytology ancillary only   HIV antibody (with reflex)   RPR    Return in about 3 months (around 03/05/2022).    ErLucious GrovesDO

## 2021-12-04 NOTE — Assessment & Plan Note (Signed)
No complaints  -COntinue eliquis 5mg  BID

## 2021-12-04 NOTE — Assessment & Plan Note (Addendum)
coronoary CTA without significant atherosclerosis, continue crestor 20mg  daily, repeat lipid panel

## 2021-12-04 NOTE — Patient Instructions (Signed)
I am sending you in a fluid pill called furosemide.  I want you to take 1 pill a day if you feel you are swelling or retaining fluid.

## 2021-12-04 NOTE — Assessment & Plan Note (Addendum)
Overall euvolemic to slightly hypervolemic, he does not have a perscription for lasix.  Weight up 2 lbs since cards visit 2 weeks ago.  - Continue entresto 97-103 BID (was taking only once a day, I have written a 2 on this bottle as well for a reminder) - Continue Coreg 12.5 BID -Lasix 20mg  daily PRN

## 2021-12-04 NOTE — Assessment & Plan Note (Signed)
BP very high on intial check, repeat improved.  Brings medications however enteresto is from about 5-6 weeks ago and still has pills remaining.  He has only been taking once a day.  INcrease entresto to BID

## 2021-12-05 LAB — BMP8+ANION GAP
Anion Gap: 13 mmol/L (ref 10.0–18.0)
BUN/Creatinine Ratio: 12 (ref 10–24)
BUN: 12 mg/dL (ref 8–27)
CO2: 23 mmol/L (ref 20–29)
Calcium: 9.3 mg/dL (ref 8.6–10.2)
Chloride: 102 mmol/L (ref 96–106)
Creatinine, Ser: 0.98 mg/dL (ref 0.76–1.27)
Glucose: 166 mg/dL — ABNORMAL HIGH (ref 70–99)
Potassium: 3.9 mmol/L (ref 3.5–5.2)
Sodium: 138 mmol/L (ref 134–144)
eGFR: 87 mL/min/{1.73_m2} (ref >59)

## 2021-12-05 LAB — LIPID PANEL
Chol/HDL Ratio: 2.7 ratio (ref 0.0–5.0)
Cholesterol, Total: 115 mg/dL (ref 100–199)
HDL: 42 mg/dL (ref >39)
LDL Chol Calc (NIH): 58 mg/dL (ref 0–99)
Triglycerides: 71 mg/dL (ref 0–149)
VLDL Cholesterol Cal: 15 mg/dL (ref 5–40)

## 2021-12-05 LAB — HIV ANTIBODY (ROUTINE TESTING W REFLEX): HIV Screen 4th Generation wRfx: NONREACTIVE

## 2021-12-05 LAB — URINE CYTOLOGY ANCILLARY ONLY
Chlamydia: NEGATIVE
Comment: NEGATIVE
Comment: NEGATIVE
Comment: NORMAL
Neisseria Gonorrhea: NEGATIVE
Trichomonas: NEGATIVE

## 2021-12-05 LAB — RPR: RPR Ser Ql: NONREACTIVE

## 2021-12-09 ENCOUNTER — Telehealth: Payer: Self-pay | Admitting: Internal Medicine

## 2021-12-09 NOTE — Telephone Encounter (Signed)
Attempted to call Antonio Ramos, no answer and mailbox full.  If he calls back please let him know that his labwork looks good and STD panel is all negative.

## 2021-12-10 ENCOUNTER — Other Ambulatory Visit (HOSPITAL_COMMUNITY): Payer: BC Managed Care – PPO

## 2021-12-10 ENCOUNTER — Encounter (HOSPITAL_COMMUNITY): Payer: Self-pay | Admitting: Internal Medicine

## 2022-01-02 ENCOUNTER — Telehealth: Payer: Self-pay | Admitting: Internal Medicine

## 2022-01-02 MED ORDER — HYDROCOD POLI-CHLORPHE POLI ER 10-8 MG/5ML PO SUER: 5.0000 mL | Freq: Every evening | ORAL | 0 refills | Status: DC | PRN

## 2022-01-02 NOTE — Telephone Encounter (Signed)
Antonio Ramos reports he started having some throat irritation and nocturnal cough starting Saturday.  He tested himself today and found to be COVID-positive.  Is already been 5 days so he is pretty much out of the Paxlovid window he has been vaccinated so this may not be that important anyway and he would have medication interactions with Paxlovid.  Overall symptoms are mild we will treat his nocturnal cough with nightly Tussionex.  He needs a routine appointment with me I discussed him following up for that on November 30 he would need an appointment scheduled we will send to the front desk.  Also wrote a work note for him.

## 2022-01-05 ENCOUNTER — Telehealth: Payer: Self-pay

## 2022-01-05 NOTE — Telephone Encounter (Signed)
Pt spoke with NT Lela  about his med ( )  he told her that the pharmacy said they dont have the med .Marland Kitchen I returned the call  for more details  pt stated that the pharmacy told him there are out of stock of the med .. I explained the next step is for him to check around at his  local  pharmacy see which on carries the med and call back with the name and info .Marland Kitchen

## 2022-01-08 ENCOUNTER — Ambulatory Visit (HOSPITAL_COMMUNITY): Payer: BC Managed Care – PPO | Attending: Internal Medicine

## 2022-01-08 DIAGNOSIS — I5030 Unspecified diastolic (congestive) heart failure: Secondary | ICD-10-CM | POA: Diagnosis not present

## 2022-01-08 DIAGNOSIS — I77810 Thoracic aortic ectasia: Secondary | ICD-10-CM

## 2022-01-08 DIAGNOSIS — I340 Nonrheumatic mitral (valve) insufficiency: Secondary | ICD-10-CM

## 2022-01-08 DIAGNOSIS — I5020 Unspecified systolic (congestive) heart failure: Secondary | ICD-10-CM | POA: Insufficient documentation

## 2022-01-08 DIAGNOSIS — I517 Cardiomegaly: Secondary | ICD-10-CM

## 2022-01-08 LAB — ECHOCARDIOGRAM COMPLETE
Area-P 1/2: 2.48 cm2
S' Lateral: 3.1 cm

## 2022-03-05 ENCOUNTER — Ambulatory Visit (INDEPENDENT_AMBULATORY_CARE_PROVIDER_SITE_OTHER): Payer: BC Managed Care – PPO | Admitting: Internal Medicine

## 2022-03-05 ENCOUNTER — Other Ambulatory Visit: Payer: Self-pay

## 2022-03-05 ENCOUNTER — Encounter: Payer: Self-pay | Admitting: Internal Medicine

## 2022-03-05 VITALS — BP 162/103 | HR 70 | Temp 97.6°F | Ht 74.0 in | Wt 215.5 lb

## 2022-03-05 DIAGNOSIS — I5022 Chronic systolic (congestive) heart failure: Secondary | ICD-10-CM | POA: Diagnosis not present

## 2022-03-05 DIAGNOSIS — Z87891 Personal history of nicotine dependence: Secondary | ICD-10-CM | POA: Diagnosis not present

## 2022-03-05 DIAGNOSIS — I110 Hypertensive heart disease with heart failure: Secondary | ICD-10-CM | POA: Diagnosis not present

## 2022-03-05 DIAGNOSIS — I5020 Unspecified systolic (congestive) heart failure: Secondary | ICD-10-CM

## 2022-03-05 DIAGNOSIS — I10 Essential (primary) hypertension: Secondary | ICD-10-CM

## 2022-03-05 MED ORDER — CARVEDILOL 25 MG PO TABS: 25.0000 mg | ORAL_TABLET | Freq: Two times a day (BID) | ORAL | 3 refills | Status: DC

## 2022-03-05 NOTE — Assessment & Plan Note (Signed)
Blood pressure is not to goal suspect this is at least partially due to lack of affordability of Entresto.  He reports he is on [employer] now I wonder if we can get him some patient assistance I will reach out to [place] to see if we get him enrolled in a patient assistance program for medications.  As his heart rate is above goal as well I will go ahead and increase his carvedilol to 25 mg twice daily.

## 2022-03-05 NOTE — Assessment & Plan Note (Signed)
Ejection fraction is improved.  Needs better blood pressure control having some affordability issues with Antonio Ramos we will see if we get him enrolled in patient assistance.  Increase carvedilol to 25 mg twice daily

## 2022-03-05 NOTE — Patient Instructions (Addendum)
I am increasing Carvedilol to 25mg  twice a day.  You will need to take 2 tablets of your old medication twice a day until you pick up the new prescription.  I am going to see about getting you some patient assistance for Entresto.  You can pick up Voltaren gel (also called Diclofenac gel) over the counter now and use it on your shoulder.

## 2022-03-05 NOTE — Progress Notes (Signed)
Established Patient Office Visit  Subjective   Patient ID: Antonio Ramos, male    DOB: 08/06/1960  Age: 61 y.o. MRN: [id number]  Chief Complaint  Patient presents with   Follow-up    Not taking Furosemide.   Flor is following up today for high blood pressure and congestive heart failure.  This time last visit I prescribed him some Lasix.  He has not been taking it and notes that he urinated frequently has not really felt that he is needed it he denies any lower extremity edema or any dyspnea on exertion no orthopnea.  He did complete the echocardiogram which shows an improved ejection fraction.  Still has cardiomegaly with LVH from his hypertension.  Unfortunately he reports that Delene Loll is very expensive costing over $100 per month and this is unaffordable.  Looks like he last filled the medication in September.  He does report taking Entresto twice daily although med refill data is not as clear.  Notes taking carvedilol 12.5 twice daily as well.  He recovered well from [place], notes tussionex was very helpful for symptomatic relief.   Objective:     BP (!) 160/100 (BP Location: Right Arm, Patient Position: Sitting, Cuff Size: Normal)   Pulse 74   Temp 97.6 F (36.4 C) (Oral)   Ht _0  (1.88 m)   Wt 215 lb 8 oz (97.8 kg)   SpO2 98% Comment: RA  BMI 27.67 kg/m  BP Readings from Last 3 Encounters:  03/05/22 (!) 160/100  12/04/21 (!) 156/91  11/19/21 130/80   Wt Readings from Last 3 Encounters:  03/05/22 215 lb 8 oz (97.8 kg)  12/04/21 210 lb 12.8 oz (95.6 kg)  11/19/21 208 lb (94.3 kg)      Physical Exam Vitals and nursing note reviewed.  Constitutional:      Appearance: Normal appearance.  Cardiovascular:     Rate and Rhythm: Normal rate and regular rhythm.     Pulses: Normal pulses.     Heart sounds: Normal heart sounds.  Pulmonary:     Effort: Pulmonary effort is normal.     Breath sounds: Normal breath sounds.  Musculoskeletal:     Right lower leg: No edema.      Left lower leg: No edema.  Neurological:     Mental Status: He is alert.  Psychiatric:        Mood and Affect: Mood normal.      No results found for any visits on 03/05/22.  Last CBC Lab Results  Component Value Date   WBC 5.1 08/11/2021   HGB 14.2 08/11/2021   HCT 43.0 08/11/2021   MCV 84 08/11/2021   MCH 27.7 08/11/2021   RDW 13.4 08/11/2021   PLT 204 85/92/9244   Last metabolic panel Lab Results  Component Value Date   GLUCOSE 166 (H) 12/04/2021   NA 138 12/04/2021   K 3.9 12/04/2021   CL 102 12/04/2021   CO2 23 12/04/2021   BUN 12 12/04/2021   CREATININE 0.98 12/04/2021   EGFR 87 12/04/2021   CALCIUM 9.3 12/04/2021   PROT 7.1 06/13/2020   ALBUMIN 4.5 06/13/2020   LABGLOB 2.6 06/13/2020   AGRATIO 1.7 06/13/2020   BILITOT <0.2 06/13/2020   ALKPHOS 89 06/13/2020   AST 25 06/13/2020   ALT 26 06/13/2020   ANIONGAP 6 07/07/2021   Last lipids Lab Results  Component Value Date   CHOL 115 12/04/2021   HDL 42 12/04/2021   LDLCALC 58 12/04/2021   TRIG  71 12/04/2021   CHOLHDL 2.7 12/04/2021      The ASCVD Risk score (Arnett DK, et al., 2019) failed to calculate for the following reasons:   The patient has a prior MI or stroke diagnosis    Assessment & Plan:   Problem List Items Addressed This Visit       Cardiovascular and Mediastinum   Essential hypertension (Chronic)    Blood pressure is not to goal suspect this is at least partially due to lack of affordability of Entresto.  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  As his heart rate is above goal as well I will go ahead and increase his carvedilol to 25 mg twice daily.      Relevant Medications   carvedilol (COREG) 25 MG tablet   HFrEF (heart failure with reduced ejection fraction) ([place]) - Primary    Ejection fraction is improved.  Needs  better blood pressure control having some affordability issues with Delene Loll we will see if we get him enrolled in patient assistance.  Increase carvedilol to 25 mg twice daily      Relevant Medications   carvedilol (COREG) 25 MG tablet   Other Visit Diagnoses     Chronic systolic heart failure (HCC)       Relevant Medications   carvedilol (COREG) 25 MG tablet       Return in about 3 months (around 06/04/2022).    [name], DO

## 2022-03-10 ENCOUNTER — Other Ambulatory Visit (HOSPITAL_COMMUNITY): Payer: Self-pay

## 2022-05-06 ENCOUNTER — Other Ambulatory Visit (HOSPITAL_COMMUNITY): Payer: Self-pay

## 2022-05-06 ENCOUNTER — Telehealth: Payer: Self-pay | Admitting: Internal Medicine

## 2022-05-06 DIAGNOSIS — I480 Paroxysmal atrial fibrillation: Secondary | ICD-10-CM

## 2022-05-06 DIAGNOSIS — I10 Essential (primary) hypertension: Secondary | ICD-10-CM

## 2022-05-06 DIAGNOSIS — I5022 Chronic systolic (congestive) heart failure: Secondary | ICD-10-CM

## 2022-05-06 MED ORDER — LOSARTAN POTASSIUM 50 MG PO TABS
50.0000 mg | ORAL_TABLET | Freq: Two times a day (BID) | ORAL | 5 refills | Status: DC
  Filled 2022-05-06: qty 60, 30d supply, fill #0
  Filled 2022-07-10: qty 60, 30d supply, fill #1
  Filled 2022-08-19: qty 60, 30d supply, fill #2
  Filled 2022-09-26: qty 60, 30d supply, fill #3
  Filled 2022-10-30: qty 60, 30d supply, fill #4

## 2022-05-06 MED ORDER — FUROSEMIDE 20 MG PO TABS
20.0000 mg | ORAL_TABLET | Freq: Every day | ORAL | 2 refills | Status: AC | PRN
  Filled 2022-05-06: qty 30, 30d supply, fill #0
  Filled 2022-07-10: qty 30, 30d supply, fill #1

## 2022-05-06 MED ORDER — RIVAROXABAN 20 MG PO TABS
20.0000 mg | ORAL_TABLET | Freq: Every day | ORAL | 5 refills | Status: DC
  Filled 2022-05-06: qty 30, 30d supply, fill #0
  Filled 2022-07-10: qty 30, 30d supply, fill #1
  Filled 2022-08-10: qty 30, 30d supply, fill #2
  Filled 2022-09-13 – 2022-09-14 (×2): qty 30, 30d supply, fill #3
  Filled 2022-10-30: qty 30, 30d supply, fill #4

## 2022-05-06 MED ORDER — ROSUVASTATIN CALCIUM 20 MG PO TABS
20.0000 mg | ORAL_TABLET | Freq: Every day | ORAL | 5 refills | Status: DC
Start: 2022-05-06 — End: 2022-12-24
  Filled 2022-05-06: qty 30, 30d supply, fill #0
  Filled 2022-07-10: qty 30, 30d supply, fill #1
  Filled 2022-08-10: qty 30, 30d supply, fill #2
  Filled 2022-09-13 – 2022-09-14 (×2): qty 30, 30d supply, fill #3
  Filled 2022-10-30: qty 30, 30d supply, fill #4

## 2022-05-06 MED ORDER — CARVEDILOL 25 MG PO TABS
25.0000 mg | ORAL_TABLET | Freq: Two times a day (BID) | ORAL | 5 refills | Status: DC
  Filled 2022-05-06: qty 60, 30d supply, fill #0
  Filled 2022-07-10: qty 60, 30d supply, fill #1
  Filled 2022-08-19: qty 60, 30d supply, fill #2
  Filled 2022-09-26: qty 60, 30d supply, fill #3
  Filled 2022-10-30: qty 60, 30d supply, fill #4

## 2022-05-06 NOTE — Telephone Encounter (Signed)
Received call from [place] that he has lost his employer based health insurance, he went part time at [employer] and did not realize this would happen until he went to pick up his medications this month.  He is working to apply for [employer] but knows it will take time.  His two big costly medications are eliquis and entresto.  He says they are about $200 and $700 respectively.  I will loop in [place] and see what we can do.  We can potentially adjust medications to [place] under IM program.  I potentially could change eliquis to xarelto for PAF May need to change Entresto to covered ARB.

## 2022-05-06 NOTE — Telephone Encounter (Signed)
After speaking with Dr [place], he will continue with IM program for Xarelto and a few other medications.   Patient should apply for medicaid in the meantime and follow up with office on coverage status. Will enroll in patient assistance for Eliquis/xarelto and entresto if needed.

## 2022-05-06 NOTE — Telephone Encounter (Signed)
Changed medications to [facility].  Should be ~$20 for all 5 medications 1 month supply. Main changes are Eliquis to Xarelto and Entresto to Losartan.

## 2022-05-16 ENCOUNTER — Encounter: Payer: Self-pay | Admitting: *Deleted

## 2022-05-25 ENCOUNTER — Ambulatory Visit: Payer: BC Managed Care – PPO | Attending: Internal Medicine | Admitting: Internal Medicine

## 2022-06-04 ENCOUNTER — Other Ambulatory Visit (HOSPITAL_COMMUNITY): Payer: Self-pay

## 2022-06-04 ENCOUNTER — Ambulatory Visit (INDEPENDENT_AMBULATORY_CARE_PROVIDER_SITE_OTHER): Payer: Self-pay | Admitting: Internal Medicine

## 2022-06-04 ENCOUNTER — Other Ambulatory Visit: Payer: Self-pay

## 2022-06-04 ENCOUNTER — Encounter: Payer: Self-pay | Admitting: Internal Medicine

## 2022-06-04 VITALS — BP 161/109 | HR 62 | Temp 97.6°F | Ht 74.0 in | Wt 219.1 lb

## 2022-06-04 DIAGNOSIS — I110 Hypertensive heart disease with heart failure: Secondary | ICD-10-CM

## 2022-06-04 DIAGNOSIS — I428 Other cardiomyopathies: Secondary | ICD-10-CM

## 2022-06-04 DIAGNOSIS — I480 Paroxysmal atrial fibrillation: Secondary | ICD-10-CM

## 2022-06-04 DIAGNOSIS — M7541 Impingement syndrome of right shoulder: Secondary | ICD-10-CM

## 2022-06-04 DIAGNOSIS — I5020 Unspecified systolic (congestive) heart failure: Secondary | ICD-10-CM

## 2022-06-04 DIAGNOSIS — I10 Essential (primary) hypertension: Secondary | ICD-10-CM

## 2022-06-04 MED ORDER — DICLOFENAC SODIUM 1 % EX GEL: 2.0000 g | Freq: Four times a day (QID) | CUTANEOUS | Status: AC

## 2022-06-04 MED ORDER — SPIRONOLACTONE 25 MG PO TABS
25.0000 mg | ORAL_TABLET | Freq: Every day | ORAL | 5 refills | Status: DC
  Filled 2022-06-04: qty 30, 30d supply, fill #0
  Filled 2022-07-10: qty 30, 30d supply, fill #1
  Filled 2022-08-19: qty 30, 30d supply, fill #2
  Filled 2022-09-26: qty 30, 30d supply, fill #3
  Filled 2022-10-30: qty 30, 30d supply, fill #4

## 2022-06-04 NOTE — Progress Notes (Signed)
Established Patient Office Visit  Subjective   Patient ID: Antonio Ramos, male    DOB: Sep 21, 1960  Age: 62 y.o. MRN: [id number]  Chief Complaint  Patient presents with   Follow-up    Re-check BP.   Antonio Ramos follows up today for recheck of blood pressure and repeat evaluation of his systolic congestive heart failure.  Most recently he has lost his job and lost healthcare insurance he is in the process of applying for Medicaid which he hopes will be covered within the next month.  He was able to get the medications that I changed him to at the [facility] and he brings all of his daily medications with him today.  He reports adherence to all of these medications.    Objective:     BP (!) 161/109 (BP Location: Right Arm, Patient Position: Sitting, Cuff Size: Large)   Pulse 62   Temp 97.6 F (36.4 C) (Oral)   Ht '6\' 2"'$  (1.88 m)   Wt 219 lb 1.6 oz (99.4 kg)   SpO2 100% Comment: RA  BMI 28.13 kg/m  BP Readings from Last 3 Encounters:  06/04/22 (!) 161/109  03/05/22 (!) 162/103  12/04/21 (!) 156/91   Wt Readings from Last 3 Encounters:  06/04/22 219 lb 1.6 oz (99.4 kg)  03/05/22 215 lb 8 oz (97.8 kg)  12/04/21 210 lb 12.8 oz (95.6 kg)      Physical Exam Vitals and nursing note reviewed.  Constitutional:      Appearance: Normal appearance.  Cardiovascular:     Rate and Rhythm: Normal rate and regular rhythm.  Pulmonary:     Effort: Pulmonary effort is normal.     Breath sounds: Normal breath sounds.  Neurological:     Mental Status: He is alert.  Psychiatric:        Mood and Affect: Mood normal.        Behavior: Behavior normal.      No results found for any visits on 06/04/22.  Last CBC Lab Results  Component Value Date   WBC 5.1 08/11/2021   HGB 14.2 08/11/2021   HCT 43.0 08/11/2021   MCV 84 08/11/2021   MCH 27.7 08/11/2021   RDW 13.4 08/11/2021   PLT 204 [id number]   Last metabolic panel Lab Results  Component Value Date   GLUCOSE 166 (H)  12/04/2021   NA 138 12/04/2021   K 3.9 12/04/2021   CL 102 12/04/2021   CO2 23 12/04/2021   BUN 12 12/04/2021   CREATININE 0.98 12/04/2021   EGFR 87 12/04/2021   CALCIUM 9.3 12/04/2021   PROT 7.1 06/13/2020   ALBUMIN 4.5 06/13/2020   LABGLOB 2.6 06/13/2020   AGRATIO 1.7 06/13/2020   BILITOT <0.2 06/13/2020   ALKPHOS 89 06/13/2020   AST 25 06/13/2020   ALT 26 06/13/2020   ANIONGAP 6 07/07/2021   Last lipids Lab Results  Component Value Date   CHOL 115 12/04/2021   HDL 42 12/04/2021   LDLCALC 58 12/04/2021   TRIG 71 12/04/2021   CHOLHDL 2.7 12/04/2021      The ASCVD Risk score (Arnett DK, et al., 2019) failed to calculate for the following reasons:   The patient has a prior MI or stroke diagnosis    Assessment & Plan:   Problem List Items Addressed This Visit       Cardiovascular and Mediastinum   Essential hypertension - Primary (Chronic)    Challenging with affordability issues.  He is on full dose  ARB and carvedilol we will add spironolactone 25 mg daily.  BMP today to check potassium level and follow-up in 1 month.      Relevant Medications   spironolactone (ALDACTONE) 25 MG tablet   Other Relevant Orders   BMP8+Anion Gap   Paroxysmal atrial fibrillation ([place])    He is now on Xarelto 20 mg daily and is affordable through our pharmacy 340 b program.      Relevant Medications   spironolactone (ALDACTONE) 25 MG tablet   Nonischemic cardiomyopathy (HCC)   Relevant Medications   spironolactone (ALDACTONE) 25 MG tablet   HFrEF (heart failure with reduced ejection fraction) ([place])    Unable to afford Entresto but I have changed him to losartan he is on full dose he is on carvedilol Crestor and Lasix as needed will add spironolactone 25 mg daily he is euvolemic today.      Relevant Medications   spironolactone (ALDACTONE) 25 MG tablet     Musculoskeletal and Integument   Shoulder impingement syndrome, right    Continue Voltaren gel as needed.        Return in about 4 weeks (around 07/02/2022).    Antonio Groves, DO

## 2022-06-04 NOTE — Assessment & Plan Note (Signed)
Continue Voltaren gel as needed.

## 2022-06-04 NOTE — Patient Instructions (Signed)
I want you to continue your current medications.  I am adding a new medication called spironolactone.  You will take '25mg'$  once a day.  I want to see you back in about a month.

## 2022-06-04 NOTE — Assessment & Plan Note (Signed)
Challenging with affordability issues.  He is on full dose ARB and carvedilol we will add spironolactone 25 mg daily.  BMP today to check potassium level and follow-up in 1 month.

## 2022-06-04 NOTE — Assessment & Plan Note (Signed)
He is now on Xarelto 20 mg daily and is affordable through our pharmacy 340 b program.

## 2022-06-04 NOTE — Assessment & Plan Note (Signed)
Unable to afford Entresto but I have changed him to losartan he is on full dose he is on carvedilol Crestor and Lasix as needed will add spironolactone 25 mg daily he is euvolemic today.

## 2022-06-06 LAB — BMP8+ANION GAP
Anion Gap: 15 mmol/L (ref 10.0–18.0)
BUN/Creatinine Ratio: 11 (ref 10–24)
BUN: 11 mg/dL (ref 8–27)
CO2: 24 mmol/L (ref 20–29)
Calcium: 9.7 mg/dL (ref 8.6–10.2)
Chloride: 102 mmol/L (ref 96–106)
Creatinine, Ser: 1.03 mg/dL (ref 0.76–1.27)
Glucose: 74 mg/dL (ref 70–99)
Potassium: 4.7 mmol/L (ref 3.5–5.2)
Sodium: 141 mmol/L (ref 134–144)
eGFR: 82 mL/min/{1.73_m2} (ref >59)

## 2022-06-29 ENCOUNTER — Encounter (HOSPITAL_COMMUNITY): Payer: Self-pay | Admitting: Family Medicine

## 2022-06-29 ENCOUNTER — Emergency Department (HOSPITAL_COMMUNITY): Payer: Medicaid Other

## 2022-06-29 ENCOUNTER — Observation Stay (HOSPITAL_COMMUNITY)
Admission: EM | Admit: 2022-06-29 | Discharge: 2022-06-29 | Discharge status: Against Medical Advice | Payer: Medicaid Other | Attending: Family Medicine | Admitting: Family Medicine

## 2022-06-29 DIAGNOSIS — I5043 Acute on chronic combined systolic (congestive) and diastolic (congestive) heart failure: Secondary | ICD-10-CM

## 2022-06-29 DIAGNOSIS — I4891 Unspecified atrial fibrillation: Principal | ICD-10-CM | POA: Insufficient documentation

## 2022-06-29 DIAGNOSIS — Z79899 Other long term (current) drug therapy: Secondary | ICD-10-CM | POA: Insufficient documentation

## 2022-06-29 DIAGNOSIS — I428 Other cardiomyopathies: Secondary | ICD-10-CM

## 2022-06-29 DIAGNOSIS — R002 Palpitations: Secondary | ICD-10-CM | POA: Diagnosis present

## 2022-06-29 DIAGNOSIS — I480 Paroxysmal atrial fibrillation: Secondary | ICD-10-CM | POA: Diagnosis not present

## 2022-06-29 LAB — BASIC METABOLIC PANEL
Anion gap: 10 (ref 5–15)
BUN: 13 mg/dL (ref 8–23)
CO2: 22 mmol/L (ref 22–32)
Calcium: 8.9 mg/dL (ref 8.9–10.3)
Chloride: 105 mmol/L (ref 98–111)
Creatinine, Ser: 1.1 mg/dL (ref 0.61–1.24)
GFR, Estimated: >60 mL/min (ref >60)
Glucose, Bld: 112 mg/dL — ABNORMAL HIGH (ref 70–99)
Potassium: 4 mmol/L (ref 3.5–5.1)
Sodium: 137 mmol/L (ref 135–145)

## 2022-06-29 LAB — BRAIN NATRIURETIC PEPTIDE: B Natriuretic Peptide: 40.3 pg/mL (ref 0.0–100.0)

## 2022-06-29 LAB — CBC WITH DIFFERENTIAL/PLATELET
Abs Immature Granulocytes: 0.01 10*3/uL (ref 0.00–0.07)
Basophils Absolute: 0 10*3/uL (ref 0.0–0.1)
Basophils Relative: 1 %
Eosinophils Absolute: 0.1 10*3/uL (ref 0.0–0.5)
Eosinophils Relative: 3 %
HCT: 43.3 % (ref 39.0–52.0)
Hemoglobin: 14.2 g/dL (ref 13.0–17.0)
Immature Granulocytes: 0 %
Lymphocytes Relative: 31 %
Lymphs Abs: 1.5 10*3/uL (ref 0.7–4.0)
MCH: 27.7 pg (ref 26.0–34.0)
MCHC: 32.8 g/dL (ref 30.0–36.0)
MCV: 84.4 fL (ref 80.0–100.0)
Monocytes Absolute: 0.5 10*3/uL (ref 0.1–1.0)
Monocytes Relative: 10 %
Neutro Abs: 2.6 10*3/uL (ref 1.7–7.7)
Neutrophils Relative %: 55 %
Platelets: 201 10*3/uL (ref 150–400)
RBC: 5.13 MIL/uL (ref 4.22–5.81)
RDW: 15 % (ref 11.5–15.5)
WBC: 4.8 10*3/uL (ref 4.0–10.5)
nRBC: 0 % (ref 0.0–0.2)

## 2022-06-29 LAB — MAGNESIUM: Magnesium: 1.8 mg/dL (ref 1.7–2.4)

## 2022-06-29 LAB — TROPONIN I (HIGH SENSITIVITY)
Troponin I (High Sensitivity): 8 ng/L (ref <18)
Troponin I (High Sensitivity): 12 ng/L (ref <18)

## 2022-06-29 MED ORDER — DILTIAZEM HCL-DEXTROSE 125-5 MG/125ML-% IV SOLN (PREMIX)
5.0000 mg/h | INTRAVENOUS | Status: DC
  Administered 2022-06-29: 5 mg/h via INTRAVENOUS
  Filled 2022-06-29: qty 125

## 2022-06-29 MED ORDER — SODIUM CHLORIDE 0.9 % IV BOLUS
500.0000 mL | Freq: Once | INTRAVENOUS | Status: AC
  Administered 2022-06-29: 500 mL via INTRAVENOUS

## 2022-06-29 MED ORDER — METOPROLOL TARTRATE 25 MG PO TABS: 25.0000 mg | ORAL_TABLET | Freq: Two times a day (BID) | ORAL | Status: DC

## 2022-06-29 MED ORDER — HYDROCODONE-ACETAMINOPHEN 5-325 MG PO TABS: 1.0000 | ORAL_TABLET | ORAL | Status: DC | PRN

## 2022-06-29 MED ORDER — ACETAMINOPHEN 325 MG PO TABS: 650.0000 mg | ORAL_TABLET | Freq: Four times a day (QID) | ORAL | Status: DC | PRN

## 2022-06-29 MED ORDER — METOPROLOL TARTRATE 5 MG/5ML IV SOLN
2.5000 mg | Freq: Once | INTRAVENOUS | Status: AC
  Administered 2022-06-29: 2.5 mg via INTRAVENOUS
  Filled 2022-06-29: qty 5

## 2022-06-29 MED ORDER — POLYETHYLENE GLYCOL 3350 17 G PO PACK: 17.0000 g | PACK | Freq: Every day | ORAL | Status: DC | PRN

## 2022-06-29 MED ORDER — RIVAROXABAN 10 MG PO TABS: 20.0000 mg | ORAL_TABLET | Freq: Every day | ORAL | Status: DC

## 2022-06-29 MED ORDER — ACETAMINOPHEN 650 MG RE SUPP: 650.0000 mg | Freq: Four times a day (QID) | RECTAL | Status: DC | PRN

## 2022-06-29 MED ORDER — ONDANSETRON HCL 4 MG PO TABS: 4.0000 mg | ORAL_TABLET | Freq: Four times a day (QID) | ORAL | Status: DC | PRN

## 2022-06-29 MED ORDER — DILTIAZEM LOAD VIA INFUSION
10.0000 mg | Freq: Once | INTRAVENOUS | Status: AC
  Administered 2022-06-29: 10 mg via INTRAVENOUS
  Filled 2022-06-29: qty 10

## 2022-06-29 MED ORDER — SPIRONOLACTONE 12.5 MG HALF TABLET: 25.0000 mg | ORAL_TABLET | Freq: Every day | ORAL | Status: DC

## 2022-06-29 MED ORDER — ONDANSETRON HCL 4 MG/2ML IJ SOLN: 4.0000 mg | Freq: Four times a day (QID) | INTRAMUSCULAR | Status: DC | PRN

## 2022-06-29 MED ORDER — METOPROLOL TARTRATE 5 MG/5ML IV SOLN
2.5000 mg | Freq: Once | INTRAVENOUS | Status: AC
  Administered 2022-06-29: 2.5 mg via INTRAVENOUS

## 2022-06-29 MED ORDER — SODIUM CHLORIDE 0.9% FLUSH: 3.0000 mL | Freq: Two times a day (BID) | INTRAVENOUS | Status: DC

## 2022-06-29 MED ORDER — MAGNESIUM SULFATE IN D5W 1-5 GM/100ML-% IV SOLN
1.0000 g | Freq: Once | INTRAVENOUS | Status: DC
  Filled 2022-06-29: qty 100

## 2022-06-29 NOTE — ED Triage Notes (Signed)
Pt BIB EMS for cx pain while walking his dog, EMS gave 45mg  Cardizem which resolved the pain. Hx of MI in Dec2023. Tachy in 130-140's.

## 2022-06-29 NOTE — H&P (Signed)
History and Physical    Antonio Ramos [id number] DOB: [birth year] DOA: 06/29/2022  PCP: Hendricks Limes, MD   Patient coming from: Home   Chief Complaint: Palpitations, SOB, sweats   HPI: Antonio Ramos is a 62 y.o. male with medical history significant for PAF on Xarelto, hypertension, tobacco abuse, and nonischemic cardiomyopathy presents to the emergency department with palpitations, sweating, and shortness of breath.  Patient had reportedly been in his usual state until around 11 AM when he developed rapid palpitations with sweating and dyspnea.  He called EMS, was found to be in rapid atrial fibrillation, was treated with 45 mg of diltiazem, and experienced marked improvement in his symptoms.   He reports feeling back to his usual state in the ED though his heart rate is still in the 130s to 150 range.  ED Course: Upon arrival to the ED, patient is found to be afebrile and saturating mid 90s on room air with elevated heart rate and stable blood pressure.  EKG demonstrates atrial fibrillation with rate 140 and diffuse repolarization abnormality.  Chest x-ray shows underinflated lungs with slight elevation of the right hemidiaphragm.  Labs are notable for normal troponin x 2 and normal BNP.  Patient refused cardioversion in the emergency department, HR did not respond to 500 mL fluid bolus and 2 IV Lopressor pushes.  Cardiology was consulted, patient was started on IV diltiazem, and had oral Lopressor added.  Review of Systems:  All other systems reviewed and apart from HPI, are negative.  Past Medical History:  Diagnosis Date   Atrial fibrillation ([place]) 07/2021   Blood transfusion without reported diagnosis    Hemorrhagic shock ([place]) 11/23/2012   HFrEF (heart failure with reduced ejection fraction) ([place]) 07/15/2021   (a) 07/2021 LVEF 45%   Hyperlipidemia    Hypertension    MI (myocardial infarction) ([place])    per patient report, unconfimred   PUD (peptic ulcer disease)     Diagnosed in 1986, with 3 prior bleeds since that time, most recently in 2004   Tobacco use     Past Surgical History:  Procedure Laterality Date   ESOPHAGOGASTRODUODENOSCOPY N/A 11/23/2012   Procedure: ESOPHAGOGASTRODUODENOSCOPY (EGD);  Surgeon: Jeryl Columbia, MD;  Location[facility] ENDOSCOPY;  Service: Endoscopy;  Laterality: N/A;   UPPER GI ENDOSCOPY      Social History:   reports that he quit smoking about 8 years ago. His smoking use included cigarettes. He has a 4.80 pack-year smoking history. He has never used smokeless tobacco. He reports that he does not drink alcohol and does not use drugs.  No Known Allergies  Family History  Problem Relation Age of Onset   Heart attack Father        at age of 40     Prior to Admission medications   Medication Sig Start Date End Date Taking? Authorizing Provider  carvedilol (COREG) 25 MG tablet Take 1 tablet (25 mg total) by mouth 2 (two) times daily. 05/06/22   [name], DO  diclofenac Sodium (VOLTAREN ARTHRITIS PAIN) 1 % GEL Apply 2 g topically 4 (four) times daily. 06/04/22   [name], DO  furosemide (LASIX) 20 MG tablet Take 1 tablet (20 mg total) by mouth daily as needed for edema. 05/06/22 05/06/23  [name], DO  losartan (COZAAR) 50 MG tablet Take 1 tablet (50 mg total) by mouth 2 (two) times daily. 05/06/22   [name], DO  rivaroxaban (XARELTO) 20 MG TABS tablet Take 1  tablet (20 mg total) by mouth daily with supper. 05/06/22   [name], DO  rosuvastatin (CRESTOR) 20 MG tablet Take 1 tablet (20 mg total) by mouth daily. 05/06/22   [name], DO  spironolactone (ALDACTONE) 25 MG tablet Take 1 tablet (25 mg total) by mouth daily. 06/04/22   [name], DO    Physical Exam: Vitals:   06/29/22 1530 06/29/22 1700 06/29/22 1745 06/29/22 1908  BP:  114/80 124/78 117/75  Pulse: 100 (!) 144 (!) 147 99  Resp: 17 19 18 17   Temp:   98.2 F (36.8 C)   TempSrc:   Oral   SpO2: 94% 94% 93% 95%  Weight:       Height:         Constitutional: NAD, no pallor or diaphoresis   Eyes: PERTLA, lids and conjunctivae normal ENMT: Mucous membranes are moist. Posterior pharynx clear of any exudate or lesions.   Neck: supple, no masses  Respiratory: no wheezing, no crackles. No accessory muscle use.  Cardiovascular: Rate ~120 and irregularly irregular. No extremity edema.   Abdomen: No distension, no tenderness, soft. Bowel sounds active.  Musculoskeletal: no clubbing / cyanosis. No joint deformity upper and lower extremities.   Skin: no significant rashes, lesions, ulcers. Warm, dry, well-perfused. Neurologic: CN 2-12 grossly intact. Moving all extremities. Alert and oriented.  Psychiatric: Calm. Cooperative.    Labs and Imaging on Admission: I have personally reviewed following labs and imaging studies  CBC: Recent Labs  Lab 06/29/22 1414  WBC 4.8  NEUTROABS 2.6  HGB 14.2  HCT 43.3  MCV 84.4  PLT [id number]   Basic Metabolic Panel: Recent Labs  Lab 06/29/22 1414  NA 137  K 4.0  CL 105  CO2 22  GLUCOSE 112*  BUN 13  CREATININE 1.10  CALCIUM 8.9  MG 1.8   GFR: Estimated Creatinine Clearance: 86.6 mL/min (by C-G formula based on SCr of 1.1 mg/dL). Liver Function Tests: No results for input(s): "AST", "ALT", "ALKPHOS", "BILITOT", "PROT", "ALBUMIN" in the last 168 hours. No results for input(s): "LIPASE", "AMYLASE" in the last 168 hours. No results for input(s): "AMMONIA" in the last 168 hours. Coagulation Profile: No results for input(s): "INR", "PROTIME" in the last 168 hours. Cardiac Enzymes: No results for input(s): "CKTOTAL", "CKMB", "CKMBINDEX", "TROPONINI" in the last 168 hours. BNP (last 3 results) No results for input(s): "PROBNP" in the last 8760 hours. HbA1C: No results for input(s): "HGBA1C" in the last 72 hours. CBG: No results for input(s): "GLUCAP" in the last 168 hours. Lipid Profile: No results for input(s): "CHOL", "HDL", "LDLCALC", "TRIG", "CHOLHDL", "LDLDIRECT"  in the last 72 hours. Thyroid Function Tests: No results for input(s): "TSH", "T4TOTAL", "FREET4", "T3FREE", "THYROIDAB" in the last 72 hours. Anemia Panel: No results for input(s): "VITAMINB12", "FOLATE", "FERRITIN", "TIBC", "IRON", "RETICCTPCT" in the last 72 hours. Urine analysis: No results found for: "COLORURINE", "APPEARANCEUR", "LABSPEC", "PHURINE", "GLUCOSEU", "HGBUR", "BILIRUBINUR", "KETONESUR", "PROTEINUR", "UROBILINOGEN", "NITRITE", "LEUKOCYTESUR" Sepsis Labs: @LABRCNTIP (procalcitonin:4,lacticidven:4) )No results found for this or any previous visit (from the past 240 hour(s)).   Radiological Exams on Admission: DG Chest Port 1 View  Result Date: 06/29/2022 CLINICAL DATA:  Chest pain EXAM: PORTABLE CHEST 1 VIEW COMPARISON:  07/05/2021 x-ray and older FINDINGS: Slightly elevated right hemidiaphragm. Overall underinflation with some interstitial prominence. No pneumothorax, effusion or consolidation. Normal cardiopericardial silhouette. Overlapping cardiac leads. IMPRESSION: Underinflation with slightly elevated right hemidiaphragm. Electronically Signed   By: Jill Side M.D.   On: 06/29/2022 13:55  EKG: Independently reviewed. Atrial fibrillation, rate 140, diffuse repolarization abnormality.   Assessment/Plan   1. Paroxysmal atrial fibrillation with RVR  - He refused cardioversion and was treated with IV Lopressor x2, IVF, IV diltiazem bolus and infusion, and oral Lopressor in ED  - Appreciate cardiology recommendations, plan to continue IV diltiazem infusion and oral Lopressor, continue Xarelto, check TSH    2. Non-ischemic cardiomyopathy  - EF had improved to 60-65% with grade 1 diastolic dysfunction and mild MR on TTE from October 2023  - Repeat echo ordered by cardiology   - Monitor wt and I/Os, continue beta-blocker and Aldactone, he has not been taking losartan      DVT prophylaxis: Xarelto  Code Status: Full  Level of Care: Level of care: Progressive Family  Communication: None present   Disposition Plan:  Patient is from: home  Anticipated d/c is to: home  Anticipated d/c date is: 07/01/22  Patient currently: Pending conversion to SR or rate-control with oral medications  Consults called: Cardiology  Admission status: Inpatient    Vianne Bulls, MD [facility]  06/29/2022, 7:39 PM

## 2022-06-29 NOTE — ED Provider Notes (Addendum)
62 yo male w/ hx of parox A Fib on xarelto, coreg BID, presenting to the E D with palpitations onset this morning.  EDP Dr Melina Copa had discussions with patient about options for cardioversion but patient did not want cardioversion and instead requested medical therapy. He received IV diltiazem boluses by EMS and 2 rounds of IV metoprolol by Dr Melina Copa.  On my assessment he remains in  A Fib with RVR HR 150's.  I have ordered diltizem bolus & infusion, and will discuss the case with cardiology.    Troponins negative, flat. Low suspicion for ACS Patient not requiring oxygen.  Physical Exam  BP 121/76   Pulse 100   Temp 97.6 F (36.4 C) (Oral)   Resp 17   Ht 6\' 2"  (1.88 m)   Wt 99.4 kg   SpO2 94%   BMI 28.14 kg/m   Physical Exam  Procedures  .Critical Care  Performed by: Wyvonnia Dusky, MD Authorized by: Wyvonnia Dusky, MD   Critical care provider statement:    Critical care time (minutes):  30   Critical care time was exclusive of:  Separately billable procedures and treating other patients   Critical care was necessary to treat or prevent imminent or life-threatening deterioration of the following conditions:  Circulatory failure   Critical care was time spent personally by me on the following activities:  Ordering and performing treatments and interventions, ordering and review of laboratory studies, ordering and review of radiographic studies, pulse oximetry, review of old charts, examination of patient and evaluation of patient's response to treatment   Care discussed with: admitting provider   Comments:     Diltiazem infusion for A Fib rvr rate control   ED Course / MDM   Clinical Course as of 06/29/22 1915  Mon Jun 29, 2022  1350 Chest x-ray interpreted by me as no definite infiltrate.  Does have an elevated right hemidiaphragm similar to priors.  Awaiting radiology reading. [MB]  1537 Discussed with Dr. Katharina Caper cardiology.  He felt that the patient would be  appropriate for cardioversion in the ED. [MB]  [id number] Patient is very hesitant to undergo cardioversion.  He said the last time he had this they just admitted him to the hospital and his heart rate slowed down on its own. [MB]  [id number] Cardiology consulted to see patient [MT]    Clinical Course User Index [MB] Hayden Rasmussen, MD [MT] Langston Masker Carola Rhine, MD   Medical Decision Making Amount and/or Complexity of Data Reviewed Labs: ordered. Radiology: ordered.  Risk Prescription drug management. Decision regarding hospitalization.    Cardiology consult appreciated, recommending medical admission.     Wyvonnia Dusky, MD 06/29/22 [id number]    Wyvonnia Dusky, MD 06/29/22 [id number]

## 2022-06-29 NOTE — ED Provider Notes (Signed)
[facility] Provider Note   CSN: [id number] Arrival date & time: 06/29/22  1306     History  No chief complaint on file.   Antonio Ramos is a 62 y.o. male.  He has a history of atrial fibrillation NSTEMI heart failure.  He said he just taken his morning medications and drank some tea and had a piece of bread when he felt his heart start racing.  Caused him some chest discomfort and shortness of breath was sweaty.  This happened around 11 AM.  Symptoms did not improve so he called 911.  EMS that they found him in a rapid A-fib and gave him 45 mg of Cardizem.  Heart rate improved and symptoms improved on arrival to ED.  Patient states he feels pretty much back to baseline.  No recent illness.  The history is provided by the patient and the EMS personnel.  Palpitations Palpitations quality:  Fast Onset quality:  Sudden Duration:  2 hours Timing:  Constant Progression:  Improving Chronicity:  Recurrent Relieved by:  None tried Worsened by:  Nothing Associated symptoms: chest pain, diaphoresis and shortness of breath   Associated symptoms: no nausea, no syncope and no vomiting   Risk factors: hx of atrial fibrillation        Home Medications Prior to Admission medications   Medication Sig Start Date End Date Taking? Authorizing Provider  carvedilol (COREG) 25 MG tablet Take 1 tablet (25 mg total) by mouth 2 (two) times daily. 05/06/22   [name], DO  diclofenac Sodium (VOLTAREN ARTHRITIS PAIN) 1 % GEL Apply 2 g topically 4 (four) times daily. 06/04/22   [name], DO  furosemide (LASIX) 20 MG tablet Take 1 tablet (20 mg total) by mouth daily as needed for edema. 05/06/22 05/06/23  [name], DO  losartan (COZAAR) 50 MG tablet Take 1 tablet (50 mg total) by mouth 2 (two) times daily. 05/06/22   [name], DO  rivaroxaban (XARELTO) 20 MG TABS tablet Take 1 tablet (20 mg total) by mouth daily with supper. 05/06/22   [name], DO  rosuvastatin (CRESTOR) 20 MG tablet Take 1 tablet (20 mg total) by mouth daily. 05/06/22   [name], DO  spironolactone (ALDACTONE) 25 MG tablet Take 1 tablet (25 mg total) by mouth daily. 06/04/22   [name], DO      Allergies    Patient has no known allergies.    Review of Systems   Review of Systems  Constitutional:  Positive for diaphoresis.  Eyes:  Negative for visual disturbance.  Respiratory:  Positive for shortness of breath.   Cardiovascular:  Positive for chest pain and palpitations. Negative for syncope.  Gastrointestinal:  Negative for nausea and vomiting.    Physical Exam Updated Vital Signs BP 114/80   Pulse (!) 144   Temp 97.6 F (36.4 C) (Oral)   Resp 19   Ht 6\' 2"  (1.88 m)   Wt 99.4 kg   SpO2 94%   BMI 28.14 kg/m  Physical Exam Vitals and nursing note reviewed.  Constitutional:      General: He is not in acute distress.    Appearance: Normal appearance. He is well-developed.  HENT:     Head: Normocephalic and atraumatic.  Eyes:     Conjunctiva/sclera: Conjunctivae normal.  Cardiovascular:     Rate and Rhythm: Tachycardia present. Rhythm irregular.     Heart sounds: No murmur heard.  Pulmonary:     Effort: Pulmonary effort is normal. No respiratory distress.     Breath sounds: Normal breath sounds.  Abdominal:     Palpations: Abdomen is soft.     Tenderness: There is no abdominal tenderness.  Musculoskeletal:        General: No deformity.     Cervical back: Neck supple.     Right lower leg: No edema.     Left lower leg: No edema.  Skin:    General: Skin is warm and dry.     Capillary Refill: Capillary refill takes less than 2 seconds.  Neurological:     General: No focal deficit present.     Mental Status: He is alert.     ED Results / Procedures / Treatments   Labs (all labs ordered are listed, but only abnormal results are displayed) Labs Reviewed  BASIC METABOLIC PANEL - Abnormal; Notable for the following  components:      Result Value   Glucose, Bld 112 (*)    All other components within normal limits  BRAIN NATRIURETIC PEPTIDE  CBC WITH DIFFERENTIAL/PLATELET  MAGNESIUM  TROPONIN I (HIGH SENSITIVITY)  TROPONIN I (HIGH SENSITIVITY)    EKG EKG Interpretation  Date/Time:  Monday June 29 2022 13:37:01 EDT Ventricular Rate:  140 PR Interval:    QRS Duration: 79 QT Interval:  298 QTC Calculation: 455 R Axis:   74 Text Interpretation: Atrial fibrillation Repol abnrm suggests ischemia, diffuse leads afib and increased ischemic changes new from prior 4/23 Confirmed by Aletta Edouard [phone]) on 06/29/2022 1:40:43 PM  Radiology DG Chest Port 1 View  Result Date: 06/29/2022 CLINICAL DATA:  Chest pain EXAM: PORTABLE CHEST 1 VIEW COMPARISON:  07/05/2021 x-ray and older FINDINGS: Slightly elevated right hemidiaphragm. Overall underinflation with some interstitial prominence. No pneumothorax, effusion or consolidation. Normal cardiopericardial silhouette. Overlapping cardiac leads. IMPRESSION: Underinflation with slightly elevated right hemidiaphragm. Electronically Signed   By: Jill Side M.D.   On: 06/29/2022 13:55    Procedures .Critical Care  Performed by: Hayden Rasmussen, MD Authorized by: Hayden Rasmussen, MD   Critical care provider statement:    Critical care time (minutes):  45   Critical care time was exclusive of:  Separately billable procedures and treating other patients   Critical care was necessary to treat or prevent imminent or life-threatening deterioration of the following conditions:  Circulatory failure and cardiac failure   Critical care was time spent personally by me on the following activities:  Development of treatment plan with patient or surrogate, discussions with consultants, evaluation of patient's response to treatment, examination of patient, obtaining history from patient or surrogate, ordering and performing treatments and interventions, ordering and  review of laboratory studies, ordering and review of radiographic studies, pulse oximetry, re-evaluation of patient's condition and review of old charts   I assumed direction of critical care for this patient from another provider in my specialty: no       Medications Ordered in ED Medications  diltiazem (CARDIZEM) 1 mg/mL load via infusion 10 mg (10 mg Intravenous Bolus from Bag 06/29/22 1653)    And  diltiazem (CARDIZEM) 125 mg in dextrose 5% 125 mL (1 mg/mL) infusion (5 mg/hr Intravenous New Bag/Given 06/29/22 1653)  sodium chloride 0.9 % bolus 500 mL (0 mLs Intravenous Stopped 06/29/22 1539)  metoprolol tartrate (LOPRESSOR) injection 2.5 mg (2.5 mg Intravenous Given 06/29/22 1414)  metoprolol tartrate (LOPRESSOR) injection 2.5 mg (2.5 mg Intravenous Given 06/29/22 1446)  ED Course/ Medical Decision Making/ A&P Clinical Course as of 06/29/22 1740  Mon Jun 29, 2022  1350 Chest x-ray interpreted by me as no definite infiltrate.  Does have an elevated right hemidiaphragm similar to priors.  Awaiting radiology reading. [MB]  1537 Discussed with Dr. Katharina Caper cardiology.  He felt that the patient would be appropriate for cardioversion in the ED. [MB]  [id number] Patient is very hesitant to undergo cardioversion.  He said the last time he had this they just admitted him to the hospital and his heart rate slowed down on its own. [MB]  [id number] Cardiology consulted to see patient [MT]    Clinical Course User Index [MB] Hayden Rasmussen, MD [MT] Langston Masker Carola Rhine, MD                             Medical Decision Making Amount and/or Complexity of Data Reviewed Labs: ordered. Radiology: ordered.  Risk Prescription drug management.   This patient complains of chest pain shortness of breath diaphoresis; this involves an extensive number of treatment Options and is a complaint that carries with it a high risk of complications and morbidity. The differential includes ACS, arrhythmia, PE, pneumonia,  pneumothorax, vascular, anemia, metabolic derangement  I ordered, reviewed and interpreted labs, which included CBC with normal white count stable hemoglobin, chemistries normal, troponins flat I ordered medication IV fluids IV beta-blocker and reviewed PMP when indicated. I ordered imaging studies which included chest x-ray and I independently    visualized and interpreted imaging which showed no acute findings Additional history obtained from EMS Previous records obtained and reviewed in epic including recent cardiology notes I consulted Dr. Johnsie Cancel and discussed lab and imaging findings and discussed disposition.  Cardiac monitoring reviewed, A-fib with RVR Social determinants considered, no significant barriers Critical Interventions: IV rate control for rapid A-fib  After the interventions stated above, I reevaluated the patient and found still to be in A-fib.  He also declines bedside cardioversion. Admission and further testing considered, his care is signed out to Dr. Langston Masker to follow-up on response to IV Cardizem.  May need admission to the hospital for further management.         Final Clinical Impression(s) / ED Diagnoses Final diagnoses:  Atrial fibrillation with rapid ventricular response [facility])    Rx / DC Orders ED Discharge Orders     None         Hayden Rasmussen, MD 06/29/22 1743

## 2022-06-29 NOTE — Consult Note (Signed)
Cardiology Consultation   Patient ID: Antonio Ramos MRN: [id number]; DOB: 1960/12/31  Admit date: 06/29/2022 Date of Consult: 06/29/2022  PCP:  Hendricks Limes, MD   [facility] Providers Cardiologist:   Gasper Sells     Patient Profile:   Antonio Ramos is a 62 y.o. male with a hx of coronary artery disease disease, hyperlipidemia, nonischemic cardiomyopathy, hypertension, tobacco abuse, COPD who is being seen 06/29/2022 for the evaluation of rapid atrial fibrillation at the request of Dr Langston Masker .  History of Present Illness:   [name] has a history of atrial fibrillation in the past.  He also has a history of chronic combined systolic and diastolic congestive heart failure.  Patient was eating breakfast this morning and developed sudden chest pain.  EMS was called and he was found to have rapid atrial fibrillation.  He received IV Cardizem.  He improved slightly.  Still in atrial fibrillation and the ER suggested that they try a cardioversion.  The patient did not want to be cardioverted so he is now seen in consultation for further evaluation.  ECG reveals rapid atrial fibrillation.  He has ST and T wave changes that are likely rate related.  Troponin levels are 8, 12 His BNP is 40 which is normal. His CBC and basic metabolic profile are within normal limits.   He has been hospitalized in the past for atrial fib Absolutely refuses to consider DC cardioversion .  Wants to use medications to slow his HR   On cardizem drip Add metoprolol 25 BID - Ive selected metoprolol instead of Carvedilol because his BP is marginal       Past Medical History:  Diagnosis Date   Atrial fibrillation ([place]) 07/2021   Blood transfusion without reported diagnosis    Hemorrhagic shock ([place]) 11/23/2012   HFrEF (heart failure with reduced ejection fraction) ([place]) 07/15/2021   (a) 07/2021 LVEF 45%   Hyperlipidemia    Hypertension    MI (myocardial infarction) ([place])    per patient  report, unconfimred   PUD (peptic ulcer disease)    Diagnosed in 1986, with 3 prior bleeds since that time, most recently in 2004   Tobacco use     Past Surgical History:  Procedure Laterality Date   ESOPHAGOGASTRODUODENOSCOPY N/A 11/23/2012   Procedure: ESOPHAGOGASTRODUODENOSCOPY (EGD);  Surgeon: Jeryl Columbia, MD;  [facility] END[facility];  Service: Endoscopy;  Laterality: N/A;   UPPER GI ENDOSCOPY       Home Medications:  Prior to Admission medications   Medication Sig Start Date End Date Taking? Authorizing Provider  carvedilol (COREG) 25 MG tablet Take 1 tablet (25 mg total) by mouth 2 (two) times daily. 05/06/22   [name], DO  diclofenac Sodium (VOLTAREN ARTHRITIS PAIN) 1 % GEL Apply 2 g topically 4 (four) times daily. 06/04/22   [name], DO  furosemide (LASIX) 20 MG tablet Take 1 tablet (20 mg total) by mouth daily as needed for edema. 05/06/22 05/06/23  [name], DO  losartan (COZAAR) 50 MG tablet Take 1 tablet (50 mg total) by mouth 2 (two) times daily. 05/06/22   [name], DO  rivaroxaban (XARELTO) 20 MG TABS tablet Take 1 tablet (20 mg total) by mouth daily with supper. 05/06/22   [name], DO  rosuvastatin (CRESTOR) 20 MG tablet Take 1 tablet (20 mg total) by mouth daily. 05/06/22   [name], DO  spironolactone (ALDACTONE) 25 MG tablet Take 1 tablet (25 mg  total) by mouth daily. 06/04/22   [name], DO    Inpatient Medications: Scheduled Meds:  Continuous Infusions:  diltiazem (CARDIZEM) infusion 5 mg/hr (06/29/22 1653)   PRN Meds:   Allergies:   No Known Allergies  Social History:   Social History   Socioeconomic History   Marital status: Married    Spouse name: Not on file   Number of children: Not on file   Years of education: Not on file   Highest education level: Not on file  Occupational History   Not on file  Tobacco Use   Smoking status: Former    Packs/day: 0.20    Years: 24.00    Additional pack years:  0.00    Total pack years: 4.80    Types: Cigarettes    Quit date: 03/17/2014    Years since quitting: 8.2   Smokeless tobacco: Never  Substance and Sexual Activity   Alcohol use: No    Alcohol/week: 1.0 standard drink of alcohol    Types: 1 Standard drinks or equivalent per week    Comment: states used to drink heavily in 1983   Drug use: No   Sexual activity: Not on file  Other Topics Concern   Not on file  Social History Narrative   Moved from [place] to [place] in 2009.  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  No PCP.   Social Determinants of Health   Financial Resource Strain: Low Risk  (03/05/2022)   Overall Financial Resource Strain (CARDIA)    Difficulty of Paying Living Expenses: Not hard at all  Food Insecurity: No Food Insecurity (03/05/2022)   Hunger Vital Sign    Worried About Running Out of Food in the Last Year: Never true    Ran Out of Food in the Last Year: Never true  Transportation Needs: No Transportation Needs (03/05/2022)   PRAPARE - Hydrologist (Medical): No    Lack of Transportation (Non-Medical): No  Physical Activity: Not on file  Stress: Not on file  Social Connections: Moderately Isolated (03/05/2022)   Social Connection and Isolation Panel [NHANES]    Frequency of Communication with Friends and Family: More than three times a week    Frequency of Social Gatherings with Friends and Family: Not on file    Attends Religious Services: Never    Marine scientist or Organizations: No    Attends Archivist Meetings: Never    Marital Status: Married  Human resources officer Violence: Not At Risk (03/05/2022)   Humiliation, Afraid, Rape, and Kick questionnaire    Fear of Current or Ex-Partner: No    Emotionally Abused: No    Physically Abused: No    Sexually Abused: No    Family History:    Family History  Problem Relation Age of Onset   Heart attack Father        at age of 36     ROS:  Please see the  history of present illness.   All other ROS reviewed and negative.     Physical Exam/Data:   Vitals:   06/29/22 1336 06/29/22 1338 06/29/22 1430 06/29/22 1530  BP:  (!) 125/106 121/76   Pulse: (!) 137  (!) 109 100  Resp: (!) 23  15 17   Temp: 97.6 F (36.4 C)     TempSrc: Oral     SpO2: 94%  97% 94%  Weight:      Height:  Intake/Output Summary (Last 24 hours) at 06/29/2022 1730 Last data filed at 06/29/2022 1539 Gross per 24 hour  Intake 500 ml  Output --  Net 500 ml      06/29/2022    1:35 PM 06/04/2022    8:37 AM 03/05/2022    8:52 AM  Last 3 Weights  Weight (lbs) 219 lb 2.2 oz 219 lb 1.6 oz 215 lb 8 oz  Weight (kg) 99.4 kg 99.383 kg 97.75 kg     Body mass index is 28.14 kg/m.  General:  Well nourished, well developed, in no acute distress HEENT: normal Neck: no JVD Vascular: No carotid bruits; Distal pulses 2+ bilaterally Cardiac: irreg. Irreg, tachy Lungs:  clear to auscultation bilaterally, no wheezing, rhonchi or rales  Abd: soft, nontender, no hepatomegaly  Ext: no edema Musculoskeletal:  No deformities, BUE and BLE strength normal and equal Skin: warm and dry  Neuro:  CNs 2-12 intact, no focal abnormalities noted Psych:  Normal affect   EKG:  The EKG was personally reviewed and demonstrates:   Afib with HR  140.  ST depression  , ? Rate related   Telemetry:  Telemetry was personally reviewed and demonstrates:   rapid atrial fub   Relevant CV Studies:   Laboratory Data:  High Sensitivity Troponin:   Recent Labs  Lab 06/29/22 1414 06/29/22 1535  TROPONINIHS 8 12     Chemistry Recent Labs  Lab 06/29/22 1414  NA 137  K 4.0  CL 105  CO2 22  GLUCOSE 112*  BUN 13  CREATININE 1.10  CALCIUM 8.9  MG 1.8  GFRNONAA >60  ANIONGAP 10    No results for input(s): "PROT", "ALBUMIN", "AST", "ALT", "ALKPHOS", "BILITOT" in the last 168 hours. Lipids No results for input(s): "CHOL", "TRIG", "HDL", "LABVLDL", "LDLCALC", "CHOLHDL" in the last 168  hours.  Hematology Recent Labs  Lab 06/29/22 1414  WBC 4.8  RBC 5.13  HGB 14.2  HCT 43.3  MCV 84.4  MCH 27.7  MCHC 32.8  RDW 15.0  PLT 201   Thyroid No results for input(s): "TSH", "FREET4" in the last 168 hours.  BNP Recent Labs  Lab 06/29/22 1415  BNP 40.3    DDimer No results for input(s): "DDIMER" in the last 168 hours.   Radiology/Studies:  DG Chest Port 1 View  Result Date: 06/29/2022 CLINICAL DATA:  Chest pain EXAM: PORTABLE CHEST 1 VIEW COMPARISON:  07/05/2021 x-ray and older FINDINGS: Slightly elevated right hemidiaphragm. Overall underinflation with some interstitial prominence. No pneumothorax, effusion or consolidation. Normal cardiopericardial silhouette. Overlapping cardiac leads. IMPRESSION: Underinflation with slightly elevated right hemidiaphragm. Electronically Signed   By: Jill Side M.D.   On: 06/29/2022 13:55     Assessment and Plan:   Paroxysmal atrial fib:   has recurrent Afib with RVR today .  He refuses to even consider cardioversion .  Says he converted in the past with medications and will do it again .   I've tried to discuss the very safe risk profile of cardioversion but he refuses to consider it   Cont Dilt drip.  Add metoprol 25 BID  Consider amio if he does not make any progress   Continue xarelto  Check TSH     2.  Chronic combined CHF:  EF in April 2023 was 45% with grade II DD .  Repeat echo tomorrow         Risk Assessment/Risk Scores:         CHA2DS2-VASc Score = 2  This indicates a 2.2% annual risk of stroke. The patient's score is based upon: CHF History: 1 HTN History: 1 Diabetes History: 0 Stroke History: 0 Vascular Disease History: 0 Age Score: 0 Gender Score: 0          For questions or updates, please contact [place] Please consult [email or web address] for contact info under    Signed, Mertie Moores, MD  06/29/2022 5:30 PM

## 2022-06-29 NOTE — ED Notes (Signed)
ED TO INPATIENT HANDOFF REPORT  ED Nurse Name and Phone #: Aeric Burnham, RN [phone]  S Name/Age/Gender Antonio Ramos 62 y.o. male Room/Bed: 006C/006C  Code Status   Code Status: Full Code  Home/SNF/Other Home Patient oriented to: self, place, time, and situation Is this baseline? Yes   Triage Complete: Triage complete  Chief Complaint Atrial fibrillation with RVR ([place]) [I48.91]  Triage Note Pt BIB EMS for cx pain while walking his dog, EMS gave 45mg  Cardizem which resolved the pain. Hx of MI in Dec2023. Tachy in 130-140's.   Allergies No Known Allergies  Level of Care/Admitting Diagnosis ED Disposition     ED Disposition  Admit   Condition  --   Comment  Hospital Area: [place] [[id number]]  Level of Care: Progressive [102]  Admit to Progressive based on following criteria: CARDIOVASCULAR & THORACIC of moderate stability with acute coronary syndrome symptoms/low risk myocardial infarction/hypertensive urgency/arrhythmias/heart failure potentially compromising stability and stable post cardiovascular intervention patients.  May admit patient to [facility] or [facility] if equivalent level of care is available:: Yes  Covid Evaluation: Asymptomatic - no recent exposure (last 10 days) testing not required  Diagnosis: Atrial fibrillation with RVR [facility]) [id number]  Admitting Physician: Vianne Bulls [id number]  Attending Physician: Vianne Bulls [id number]  Certification:: I certify this patient will need inpatient services for at least 2 midnights  Estimated Length of Stay: 3          B Medical/Surgery History Past Medical History:  Diagnosis Date   Atrial fibrillation ([place]) 07/2021   Blood transfusion without reported diagnosis    Hemorrhagic shock ([place]) 11/23/2012   HFrEF (heart failure with reduced ejection fraction) ([place]) 07/15/2021   (a) 07/2021 LVEF 45%   Hyperlipidemia    Hypertension    MI (myocardial infarction) ([place])    per patient report,  unconfimred   PUD (peptic ulcer disease)    Diagnosed in 1986, with 3 prior bleeds since that time, most recently in 2004   Tobacco use    Past Surgical History:  Procedure Laterality Date   ESOPHAGOGASTRODUODENOSCOPY N/A 11/23/2012   Procedure: ESOPHAGOGASTRODUODENOSCOPY (EGD);  Surgeon: Jeryl Columbia, MD;  Location: [facility] ENDOSCOPY;  Service: Endoscopy;  Laterality: N/A;   UPPER GI ENDOSCOPY       A IV Location/Drains/Wounds Patient Lines/Drains/Airways Status     Active Line/Drains/Airways     Name Placement date Placement time Site Days   Peripheral IV 06/29/22 20 G Posterior;Right Hand 06/29/22  1402  Hand  less than 1   Peripheral IV 06/29/22 20 G Anterior;Distal;Left;Upper Arm 06/29/22  --  Arm  less than 1   Wound 04/06/12 Laceration Finger (Comment which one) 04/06/12  1710  Finger (Comment which one)  3736            Intake/Output Last 24 hours  Intake/Output Summary (Last 24 hours) at 06/29/2022 2005 Last data filed at 06/29/2022 1539 Gross per 24 hour  Intake 500 ml  Output --  Net 500 ml    Labs/Imaging Results for orders placed or performed during the hospital encounter of 06/29/22 (from the past 48 hour(s))  Basic metabolic panel     Status: Abnormal   Collection Time: 06/29/22  2:14 PM  Result Value Ref Range   Sodium 137 135 - 145 mmol/L   Potassium 4.0 3.5 - 5.1 mmol/L   Chloride 105 98 - 111 mmol/L   CO2 22 22 - 32 mmol/L   Glucose, Bld 112 (H)  70 - 99 mg/dL    Comment: Glucose reference range applies only to samples taken after fasting for at least 8 hours.   BUN 13 8 - 23 mg/dL   Creatinine, Ser 1.10 0.61 - 1.24 mg/dL   Calcium 8.9 8.9 - 10.3 mg/dL   GFR, Estimated >60 >60 mL/min    Comment: (NOTE) Calculated using the CKD-EPI Creatinine Equation (2021)    Anion gap 10 5 - 15    Comment: Performed at [facility] [address] [place], [place] [address]  Troponin I (High Sensitivity)     Status: None   Collection Time: 06/29/22  2:14  PM  Result Value Ref Range   Troponin I (High Sensitivity) 8 <18 ng/L    Comment: (NOTE) Elevated high sensitivity troponin I (hsTnI) values and significant  changes across serial measurements may suggest ACS but many other  chronic and acute conditions are known to elevate hsTnI results.  Refer to the "Links" section for chest pain algorithms and additional  guidance. Performed at [facility], [place] [address], [place], [place] [address]   CBC with Differential     Status: None   Collection Time: 06/29/22  2:14 PM  Result Value Ref Range   WBC 4.8 4.0 - 10.5 K/uL   RBC 5.13 4.22 - 5.81 MIL/uL   Hemoglobin 14.2 13.0 - 17.0 g/dL   HCT 43.3 39.0 - 52.0 %   MCV 84.4 80.0 - 100.0 fL   MCH 27.7 26.0 - 34.0 pg   MCHC 32.8 30.0 - 36.0 g/dL   RDW 15.0 11.5 - 15.5 %   Platelets 201 150 - 400 K/uL   nRBC 0.0 0.0 - 0.2 %   Neutrophils Relative % 55 %   Neutro Abs 2.6 1.7 - 7.7 K/uL   Lymphocytes Relative 31 %   Lymphs Abs 1.5 0.7 - 4.0 K/uL   Monocytes Relative 10 %   Monocytes Absolute 0.5 0.1 - 1.0 K/uL   Eosinophils Relative 3 %   Eosinophils Absolute 0.1 0.0 - 0.5 K/uL   Basophils Relative 1 %   Basophils Absolute 0.0 0.0 - 0.1 K/uL   Immature Granulocytes 0 %   Abs Immature Granulocytes 0.01 0.00 - 0.07 K/uL    Comment: Performed at [facility], [address], [place], [place] [address]  Magnesium     Status: None   Collection Time: 06/29/22  2:14 PM  Result Value Ref Range   Magnesium 1.8 1.7 - 2.4 mg/dL    Comment: Performed at [facility] [address], [place], [place] [address]  Brain natriuretic peptide     Status: None   Collection Time: 06/29/22  2:15 PM  Result Value Ref Range   B Natriuretic Peptide 40.3 0.0 - 100.0 pg/mL    Comment: Performed at [facility] [address], [place], [place] [address]  Troponin I (High Sensitivity)     Status: None   Collection Time: 06/29/22  3:35 PM  Result Value Ref Range   Troponin I (High  Sensitivity) 12 <18 ng/L    Comment: (NOTE) Elevated high sensitivity troponin I (hsTnI) values and significant  changes across serial measurements may suggest ACS but many other  chronic and acute conditions are known to elevate hsTnI results.  Refer to the "Links" section for chest pain algorithms and additional  guidance. Performed at [facility], [place] [address] [place], [place] [address]    DG  Chest Port 1 View  Result Date: 06/29/2022 CLINICAL DATA:  Chest pain EXAM: PORTABLE CHEST 1 VIEW COMPARISON:  07/05/2021 x-ray and older FINDINGS: Slightly elevated right hemidiaphragm. Overall underinflation with some interstitial prominence. No pneumothorax, effusion or consolidation. Normal cardiopericardial silhouette. Overlapping cardiac leads. IMPRESSION: Underinflation with slightly elevated right hemidiaphragm. Electronically Signed   By: Jill Side M.D.   On: 06/29/2022 13:55    Pending Labs Unresulted Labs (From admission, onward)     Start     Ordered   06/30/22 [id number]  Basic metabolic panel  Daily,   R      06/29/22 1939   06/30/22 0500  Magnesium  Tomorrow morning,   R        06/29/22 1939   06/30/22 0500  CBC  Daily,   R      06/29/22 1939   06/29/22 1844  TSH  Once,   URGENT        06/29/22 1843            Vitals/Pain Today's Vitals   06/29/22 1530 06/29/22 1700 06/29/22 1745 06/29/22 1908  BP:  114/80 124/78 117/75  Pulse: 100 (!) 144 (!) 147 99  Resp: 17 19 18 17   Temp:   98.2 F (36.8 C)   TempSrc:   Oral   SpO2: 94% 94% 93% 95%  Weight:      Height:      PainSc:        Isolation Precautions No active isolations  Medications Medications  diltiazem (CARDIZEM) 1 mg/mL load via infusion 10 mg (10 mg Intravenous Bolus from Bag 06/29/22 1653)    And  diltiazem (CARDIZEM) 125 mg in dextrose 5% 125 mL (1 mg/mL) infusion (12.5 mg/hr Intravenous Rate/Dose Change 06/29/22 1909)  metoprolol tartrate (LOPRESSOR) tablet 25 mg (has no administration in  time range)  rivaroxaban (XARELTO) tablet 20 mg (has no administration in time range)  spironolactone (ALDACTONE) tablet 25 mg (has no administration in time range)  sodium chloride flush (NS) 0.9 % injection 3 mL (has no administration in time range)  magnesium sulfate IVPB 1 g 100 mL (has no administration in time range)  acetaminophen (TYLENOL) tablet 650 mg (has no administration in time range)    Or  acetaminophen (TYLENOL) suppository 650 mg (has no administration in time range)  HYDROcodone-acetaminophen (NORCO/VICODIN) 5-325 MG per tablet 1-2 tablet (has no administration in time range)  polyethylene glycol (MIRALAX / GLYCOLAX) packet 17 g (has no administration in time range)  ondansetron (ZOFRAN) tablet 4 mg (has no administration in time range)    Or  ondansetron (ZOFRAN) injection 4 mg (has no administration in time range)  sodium chloride 0.9 % bolus 500 mL (0 mLs Intravenous Stopped 06/29/22 1539)  metoprolol tartrate (LOPRESSOR) injection 2.5 mg (2.5 mg Intravenous Given 06/29/22 1414)  metoprolol tartrate (LOPRESSOR) injection 2.5 mg (2.5 mg Intravenous Given 06/29/22 1446)    Mobility walks     Focused Assessments    R Recommendations: See Admitting Provider Note  Report given to:   Additional Notes:

## 2022-06-29 NOTE — ED Notes (Signed)
Patient agreed to sign AMA form. Patient verbalized understanding of risks associated with leaving, including stroke and death. Patient reports that he will take all medications as directed and knows what signs to look for and when to return to the ED. Provider made aware.

## 2022-06-29 NOTE — Discharge Summary (Signed)
Physician Discharge Summary   Patient: Antonio Ramos MRN: [id number] DOB: 09/28/1960  Admit date:     06/29/2022  Discharge date: 06/29/22  Discharge Physician: Ilene Qua Dredyn Gubbels   PCP: Hendricks Limes, MD   Recommendations at discharge:     Patient left AMA. It was strongly recommended that he stay for ongoing rate-control  Discharge Diagnoses: Principal Problem:   Atrial fibrillation with RVR [facility]) Active Problems:   Nonischemic cardiomyopathy [facility])  Hospital Course: Antonio Ramos is a 62 year old gentleman with history of PAF on Xarelto, hypertension, tobacco abuse, nonischemic cardiomyopathy presented to the emergency department with acute onset of rapid palpitations, sweats, and shortness of breath.  He was noted by EMS to be in rapid atrial fibrillation, was treated with diltiazem with resolution of his symptoms, and brought into the ED.  In the emergency department, patient remained in rapid atrial fibrillation with rates 130-150.  His blood pressure was stable and he reported feeling back to baseline despite the continued tachyarrhythmia.  He was treated in the ED with IV fluids, 2 doses of IV Lopressor, IV diltiazem bolus and infusion, and oral Lopressor.  He was seen by cardiology.  Cardioversion was recommended but the patient refused this.  He was admitted to the hospital with plans for ongoing rate control, but he expressed a desire to return home rather than staying.  Patient explained that his birthday is in 2 days and he wants to go home now.  We discussed that his heart rate is still in the 130s to 150 despite being on a diltiazem infusion actively.  I shared my concern that he did suffer serious morbidity and even death without treatment.  Patient noted that he appreciates our concern, understands the risks, and elects to leave AMA.   Consultants: Cardiology  Procedures performed: None   Disposition: Home Diet recommendation:  Cardiac diet   Discharge Exam: Filed  Weights   06/29/22 1335  Weight: 99.4 kg    Condition at discharge: serious  The results of significant diagnostics from this hospitalization (including imaging, microbiology, ancillary and laboratory) are listed below for reference.   Imaging Studies: DG Chest Port 1 View  Result Date: 06/29/2022 CLINICAL DATA:  Chest pain EXAM: PORTABLE CHEST 1 VIEW COMPARISON:  07/05/2021 x-ray and older FINDINGS: Slightly elevated right hemidiaphragm. Overall underinflation with some interstitial prominence. No pneumothorax, effusion or consolidation. Normal cardiopericardial silhouette. Overlapping cardiac leads. IMPRESSION: Underinflation with slightly elevated right hemidiaphragm. Electronically Signed   By: Jill Side M.D.   On: 06/29/2022 13:55    Microbiology: Results for orders placed or performed during the hospital encounter of 07/05/21  MRSA Next Gen by PCR, Nasal     Status: None   Collection Time: 07/06/21  1:28 AM   Specimen: Nasal Mucosa; Nasal Swab  Result Value Ref Range Status   MRSA by PCR Next Gen NOT DETECTED NOT DETECTED Final    Comment: (NOTE) The GeneXpert MRSA Assay (FDA approved for NASAL specimens only), is one component of a comprehensive MRSA colonization surveillance program. It is not intended to diagnose MRSA infection nor to guide or monitor treatment for MRSA infections. Test performance is not FDA approved in patients less than 51 years old. Performed at [facility], [place] [address] [place], [place] [address]     Labs: CBC: Recent Labs  Lab 06/29/22 1414  WBC 4.8  NEUTROABS 2.6  HGB 14.2  HCT 43.3  MCV 84.4  PLT 123456   Basic Metabolic Panel: Recent  Labs  Lab 06/29/22 1414  NA 137  K 4.0  CL 105  CO2 22  GLUCOSE 112*  BUN 13  CREATININE 1.10  CALCIUM 8.9  MG 1.8   Liver Function Tests: No results for input(s): "AST", "ALT", "ALKPHOS", "BILITOT", "PROT", "ALBUMIN" in the last 168 hours. CBG: No results for input(s): "GLUCAP" in  the last 168 hours.  Discharge time spent: less than 30 minutes.  Signed: Vianne Bulls, MD [facility] 06/29/2022

## 2022-07-10 ENCOUNTER — Ambulatory Visit (HOSPITAL_COMMUNITY)
Admission: RE | Admit: 2022-07-10 | Discharge: 2022-07-10 | Disposition: A | Discharge status: Home / Self Care | Payer: Medicaid Other | Source: Ambulatory Visit | Attending: Internal Medicine | Admitting: Internal Medicine

## 2022-07-10 ENCOUNTER — Encounter (HOSPITAL_COMMUNITY): Payer: Self-pay | Admitting: Internal Medicine

## 2022-07-10 ENCOUNTER — Other Ambulatory Visit (HOSPITAL_COMMUNITY): Payer: Self-pay

## 2022-07-10 VITALS — BP 138/90 | HR 65 | Ht 74.0 in | Wt 217.0 lb

## 2022-07-10 DIAGNOSIS — Z7901 Long term (current) use of anticoagulants: Secondary | ICD-10-CM | POA: Diagnosis not present

## 2022-07-10 DIAGNOSIS — Z8249 Family history of ischemic heart disease and other diseases of the circulatory system: Secondary | ICD-10-CM | POA: Diagnosis not present

## 2022-07-10 DIAGNOSIS — I10 Essential (primary) hypertension: Secondary | ICD-10-CM | POA: Diagnosis not present

## 2022-07-10 DIAGNOSIS — R0683 Snoring: Secondary | ICD-10-CM | POA: Diagnosis not present

## 2022-07-10 DIAGNOSIS — I480 Paroxysmal atrial fibrillation: Secondary | ICD-10-CM | POA: Insufficient documentation

## 2022-07-10 DIAGNOSIS — I4821 Permanent atrial fibrillation: Secondary | ICD-10-CM | POA: Insufficient documentation

## 2022-07-10 DIAGNOSIS — D6869 Other thrombophilia: Secondary | ICD-10-CM | POA: Insufficient documentation

## 2022-07-10 DIAGNOSIS — Z79899 Other long term (current) drug therapy: Secondary | ICD-10-CM | POA: Insufficient documentation

## 2022-07-10 DIAGNOSIS — Z6827 Body mass index (BMI) 27.0-27.9, adult: Secondary | ICD-10-CM | POA: Insufficient documentation

## 2022-07-10 DIAGNOSIS — E669 Obesity, unspecified: Secondary | ICD-10-CM | POA: Diagnosis not present

## 2022-07-10 DIAGNOSIS — Z87891 Personal history of nicotine dependence: Secondary | ICD-10-CM | POA: Insufficient documentation

## 2022-07-10 NOTE — Progress Notes (Addendum)
Primary Care Physician: Pecola Lawless, MD Primary Cardiologist: Dr. Izora Ribas Primary Electrophysiologist: None Referring Physician: [facility]   Antonio Ramos is a 62 y.o. male with a history of HTN, tobacco abuse, nonischemic cardiomyopathy, NSTEMI 2023 with demand ischemia, HLD, COPD, PUD, and paroxysmal atrial fibrillation who presents for consultation in the [facility].  The patient was initially diagnosed with atrial fibrillation in 4/23. He was recently seen in the ED on 06/29/22 for Afib with RVR rates 130-150. He was treated with IV Lopressor, diltiazem, refused DCCV, and was going to be admitted to the hospital but left AMA. He is on Xarelto 20 mg daily for a CHADS2VASC score of 2.  On follow up today, he reports feeling well since ED visit on 3/25. He has not had any additional episodes of Afib. He has good energy and no shortness of breath. He has been compliant with coreg and Xarelto but admits that a while ago he forgot his evening dose of coreg for about 3 weeks. He does not drink and says he snores a little but wakes up feeling rested and doesn't think he stops breathing at night.  He is compliant with anticoagulation and have not missed any doses. They have no bleeding concerns.  Today, he denies symptoms of palpitations, chest pain, shortness of breath, orthopnea, PND, lower extremity edema, dizziness, presyncope, syncope, snoring, daytime somnolence, bleeding, or neurologic sequela. The patient is tolerating medications without difficulties and is otherwise without complaint today.    Atrial Fibrillation Risk Factors:  he does not have symptoms or diagnosis of sleep apnea. he does not have a history of rheumatic fever. he does not have a history of alcohol use. The patient does not have a history of early familial atrial fibrillation or other arrhythmias.  he has a BMI of Body mass index is 27.86 kg/m.Marland Kitchen Filed Weights   07/10/22  1049  Weight: 98.4 kg    Family History  Problem Relation Age of Onset   Heart attack Father        at age of 70     Atrial Fibrillation Management history:  Previous antiarrhythmic drugs: None Previous cardioversions: None Previous ablations: None Anticoagulation history: Xarelto 20 mg daily   Past Medical History:  Diagnosis Date   Atrial fibrillation 07/2021   Blood transfusion without reported diagnosis    Hemorrhagic shock 11/23/2012   HFrEF (heart failure with reduced ejection fraction) 07/15/2021   (a) 07/2021 LVEF 45%   Hyperlipidemia    Hypertension    MI (myocardial infarction)    per patient report, unconfimred   PUD (peptic ulcer disease)    Diagnosed in 1986, with 3 prior bleeds since that time, most recently in 2004   Tobacco use    Past Surgical History:  Procedure Laterality Date   ESOPHAGOGASTRODUODENOSCOPY N/A 11/23/2012   Procedure: ESOPHAGOGASTRODUODENOSCOPY (EGD);  Surgeon: Petra Kuba, MD;  Location[facility] END[facility]COPY;  Service: Endoscopy;  Laterality: N/A;   UPPER GI ENDOSCOPY      Current Outpatient Medications  Medication Sig Dispense Refill   carvedilol (COREG) 25 MG tablet Take 1 tablet (25 mg total) by mouth 2 (two) times daily. 60 tablet 5   diclofenac Sodium (VOLTAREN ARTHRITIS PAIN) 1 % GEL Apply 2 g topically 4 (four) times daily. 100 g    furosemide (LASIX) 20 MG tablet Take 1 tablet (20 mg total) by mouth daily as needed for edema. 30 tablet 2   losartan (COZAAR) 50 MG  tablet Take 1 tablet (50 mg total) by mouth 2 (two) times daily. 60 tablet 5   rivaroxaban (XARELTO) 20 MG TABS tablet Take 1 tablet (20 mg total) by mouth daily with supper. 30 tablet 5   rosuvastatin (CRESTOR) 20 MG tablet Take 1 tablet (20 mg total) by mouth daily. 30 tablet 5   spironolactone (ALDACTONE) 25 MG tablet Take 1 tablet (25 mg total) by mouth daily. 30 tablet 5   No current facility-administered medications for this encounter.    No Known  Allergies  Social History   Socioeconomic History   Marital status: Married    Spouse name: Not on file   Number of children: Not on file   Years of education: Not on file   Highest education level: Not on file  Occupational History   Not on file  Tobacco Use   Smoking status: Former    Packs/day: 0.20    Years: 24.00    Additional pack years: 0.00    Total pack years: 4.80    Types: Cigarettes    Quit date: 03/17/2014    Years since quitting: 8.3   Smokeless tobacco: Never   Tobacco comments:    Former smoker 07/10/22  Substance and Sexual Activity   Alcohol use: No    Alcohol/week: 1.0 standard drink of alcohol    Types: 1 Standard drinks or equivalent per week    Comment: states used to drink heavily in 1983   Drug use: No   Sexual activity: Not on file  Other Topics Concern   Not on file  Social History Narrative   Moved from [place] to [place] in 2009.  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   Social Determinants of Health   Financial Resource Strain: Low Risk  (03/05/2022)   Overall Financial Resource Strain (CARDIA)    Difficulty of Paying Living Expenses: Not hard at all  Food Insecurity: No Food Insecurity (03/05/2022)   Hunger Vital Sign    Worried About Running Out of Food in the Last Year: Never true    Ran Out of Food in the Last Year: Never true  Transportation Needs: No Transportation Needs (03/05/2022)   PRAPARE - Administrator, Civil Service (Medical): No    Lack of Transportation (Non-Medical): No  Physical Activity: Not on file  Stress: Not on file  Social Connections: Moderately Isolated (03/05/2022)   Social Connection and Isolation Panel [NHANES]    Frequency of Communication with Friends and Family: More than three times a week    Frequency of Social Gatherings with Friends and Family: Not on file    Attends Religious Services: Never    Database administrator or Organizations: No    Attends Banker  Meetings: Never    Marital Status: Married  Catering manager Violence: Not At Risk (03/05/2022)   Humiliation, Afraid, Rape, and Kick questionnaire    Fear of Current or Ex-Partner: No    Emotionally Abused: No    Physically Abused: No    Sexually Abused: No     ROS- All systems are reviewed and negative except as per the HPI above.  Physical Exam: Vitals:   07/10/22 1049  BP: (!) 138/90  Pulse: 65  SpO2: 97%  Weight: 98.4 kg  Height: 6\' 2"  (1.88 m)    GEN- The patient is well appearing, alert and oriented x 3 today.   Head- normocephalic, atraumatic Eyes-  Sclera clear, conjunctiva pink Ears-  hearing intact Oropharynx- clear Neck- supple, no JVP Lymph- no cervical lymphadenopathy Lungs- Clear to ausculation bilaterally, normal work of breathing Heart- Regular rate and rhythm, no murmurs, rubs or gallops, PMI not laterally displaced GI- soft, NT, ND, + BS Extremities- no clubbing, cyanosis, or edema MS- no significant deformity or atrophy Skin- no rash or lesion Psych- euthymic mood, full affect Neuro- strength and sensation are intact   Wt Readings from Last 3 Encounters:  07/10/22 98.4 kg  06/29/22 99.4 kg  06/04/22 99.4 kg    EKG today demonstrates  NSR HR 65 PR 152 ms QRS 88 ms QT/Qtc 390/405 ms  Echo 01/08/22 demonstrated: 1. Left ventricular ejection fraction, by estimation, is 60 to 65%. The  left ventricle has normal function. The left ventricle has no regional  wall motion abnormalities. There is moderate concentric left ventricular  hypertrophy. Left ventricular  diastolic parameters are consistent with Grade I diastolic dysfunction  (impaired relaxation). The average left ventricular global longitudinal  strain is -16.2 %. The global longitudinal strain is abnormal.   2. Right ventricular systolic function is normal. The right ventricular  size is normal.   3. The mitral valve is grossly normal. Mild mitral valve regurgitation.   4. The aortic  valve is tricuspid. Aortic valve regurgitation is not  visualized. No aortic stenosis is present.   5. Aortic dilatation noted. There is borderline dilatation of the  ascending aorta, measuring 39 mm.  Epic records are reviewed at length today.  CHA2DS2-VASc Score = 2  The patient's score is based upon: CHF History: 1 HTN History: 1 Diabetes History: 0 Stroke History: 0 Vascular Disease History: 0 Age Score: 0 Gender Score: 0       ASSESSMENT AND PLAN: Paroxysmal Atrial Fibrillation (ICD10:  I48.0) The patient's CHA2DS2-VASc score is 2, indicating a 2.2% annual risk of stroke.    Education provided about Afib with visual diagram. Discussion about medication treatments and ablation in detail. After discussion, we will proceed with conservative observation at this time.  Continue coreg 25 mg BID. Continue Xarelto 20 mg qhs.   2. Secondary Hypercoagulable State (ICD10:  D68.69) The patient is at significant risk for stroke/thromboembolism based upon his CHA2DS2-VASc Score of 2.  Continue Rivaroxaban (Xarelto).   Education provided on indication for anticoagulation and emphasized importance of compliance.   3. Obesity Body mass index is 27.86 kg/m. Lifestyle modification was discussed at length including regular exercise and weight reduction. Encouraged daily walking.  4. Snoring with possible obstructive sleep apnea  He does not appear to have strong symptoms for OSA other than occasional snoring. Recommended to confirm with wife he does not stop breathing at night.  5. HTN Stable today; no changes.   Follow up in 1 month Afib clinic to reassess burden.   Antonio BellsJoseph Ronel Rodeheaver, PA-C [facility] [address] [place] [place], [place] [address] [phone] 07/10/2022 11:56 AM

## 2022-08-03 ENCOUNTER — Telehealth: Payer: Self-pay | Admitting: Pharmacist

## 2022-08-03 NOTE — Progress Notes (Signed)
Patient attempted to be outreached by Karlton Lemon, PharmD Candidate on 08/03/2022 to discuss hypertension. Left voicemail for patient to return our call at their convenience at [phone].   Karlton Lemon, PharmD Candidate   Catie Eppie Gibson, PharmD, BCACP, CPP [facility] [phone]

## 2022-08-10 ENCOUNTER — Other Ambulatory Visit (HOSPITAL_COMMUNITY): Payer: Self-pay

## 2022-08-13 ENCOUNTER — Ambulatory Visit (HOSPITAL_COMMUNITY)
Admission: RE | Admit: 2022-08-13 | Discharge: 2022-08-13 | Disposition: A | Discharge status: Home / Self Care | Payer: Medicaid Other | Source: Ambulatory Visit | Attending: Internal Medicine | Admitting: Internal Medicine

## 2022-08-13 VITALS — BP 142/88 | HR 62 | Ht 74.0 in | Wt 217.8 lb

## 2022-08-13 DIAGNOSIS — R0683 Snoring: Secondary | ICD-10-CM | POA: Diagnosis not present

## 2022-08-13 DIAGNOSIS — Z7901 Long term (current) use of anticoagulants: Secondary | ICD-10-CM | POA: Diagnosis not present

## 2022-08-13 DIAGNOSIS — I480 Paroxysmal atrial fibrillation: Secondary | ICD-10-CM

## 2022-08-13 DIAGNOSIS — I4821 Permanent atrial fibrillation: Secondary | ICD-10-CM

## 2022-08-13 DIAGNOSIS — D6869 Other thrombophilia: Secondary | ICD-10-CM

## 2022-08-13 DIAGNOSIS — J449 Chronic obstructive pulmonary disease, unspecified: Secondary | ICD-10-CM | POA: Insufficient documentation

## 2022-08-13 DIAGNOSIS — E669 Obesity, unspecified: Secondary | ICD-10-CM | POA: Diagnosis not present

## 2022-08-13 DIAGNOSIS — E785 Hyperlipidemia, unspecified: Secondary | ICD-10-CM | POA: Diagnosis not present

## 2022-08-13 DIAGNOSIS — Z79899 Other long term (current) drug therapy: Secondary | ICD-10-CM | POA: Insufficient documentation

## 2022-08-13 DIAGNOSIS — Z6827 Body mass index (BMI) 27.0-27.9, adult: Secondary | ICD-10-CM | POA: Insufficient documentation

## 2022-08-13 DIAGNOSIS — I10 Essential (primary) hypertension: Secondary | ICD-10-CM | POA: Insufficient documentation

## 2022-08-13 NOTE — Progress Notes (Signed)
Primary Care Physician: Gust Rung, DO Primary Cardiologist: Dr. Izora Ribas Primary Electrophysiologist: None Referring Physician: [facility]   Antonio Ramos is a 62 y.o. male with a history of HTN, tobacco abuse, nonischemic cardiomyopathy, NSTEMI 2023 with demand ischemia, HLD, COPD, PUD, and paroxysmal atrial fibrillation who presents for consultation in the [facility].  The patient was initially diagnosed with atrial fibrillation in 4/23. He was recently seen in the ED on 06/29/22 for Afib with RVR rates 130-150. He was treated with IV Lopressor, diltiazem, refused DCCV, and was going to be admitted to the hospital but left AMA. He is on Xarelto 20 mg daily for a CHADS2VASC score of 2.  On follow up 07/10/22, he reports feeling well since ED visit on 3/25. He has not had any additional episodes of Afib. He has good energy and no shortness of breath. He has been compliant with coreg and Xarelto but admits that a while ago he forgot his evening dose of coreg for about 3 weeks. He does not drink and says he snores a little but wakes up feeling rested and doesn't think he stops breathing at night.  He is compliant with anticoagulation and have not missed any doses. They have no bleeding concerns.  On follow up 08/13/22, he is currently in SR. No additional episodes of Afib. His wife is a Charity fundraiser and consistently checks his HR and BP. His BP at home is 130s systolic. He admits to BP going higher whenever he has too much Pepsi or hot chocolate. He has been compliant with medications and no bleeding concerns.   Today, he denies symptoms of palpitations, chest pain, shortness of breath, orthopnea, PND, lower extremity edema, dizziness, presyncope, syncope, snoring, daytime somnolence, bleeding, or neurologic sequela. The patient is tolerating medications without difficulties and is otherwise without complaint today.    Atrial Fibrillation Risk Factors:  he does not  have symptoms or diagnosis of sleep apnea. he does not have a history of rheumatic fever. he does not have a history of alcohol use. The patient does not have a history of early familial atrial fibrillation or other arrhythmias.  he has a BMI of Body mass index is 27.96 kg/m.Marland Kitchen Filed Weights   08/13/22 1043  Weight: 98.8 kg     Family History  Problem Relation Age of Onset   Heart attack Father        at age of 39     Atrial Fibrillation Management history:  Previous antiarrhythmic drugs: None Previous cardioversions: None Previous ablations: None Anticoagulation history: Xarelto 20 mg daily   Past Medical History:  Diagnosis Date   Atrial fibrillation (HCC) 07/2021   Blood transfusion without reported diagnosis    Hemorrhagic shock (HCC) 11/23/2012   HFrEF (heart failure with reduced ejection fraction) (HCC) 07/15/2021   (a) 07/2021 LVEF 45%   Hyperlipidemia    Hypertension    MI (myocardial infarction) (HCC)    per patient report, unconfimred   PUD (peptic ulcer disease)    Diagnosed in 1986, with 3 prior bleeds since that time, most recently in 2004   Tobacco use    Past Surgical History:  Procedure Laterality Date   ESOPHAGOGASTRODUODENOSCOPY N/A 11/23/2012   Procedure: ESOPHAGOGASTRODUODENOSCOPY (EGD);  Surgeon: Petra Kuba, MD;  Location: [facility] ENDOSCOPY;  Service: Endoscopy;  Laterality: N/A;   UPPER GI ENDOSCOPY      Current Outpatient Medications  Medication Sig Dispense Refill   carvedilol (COREG) 25 MG tablet  Take 1 tablet (25 mg total) by mouth 2 (two) times daily. 60 tablet 5   diclofenac Sodium (VOLTAREN ARTHRITIS PAIN) 1 % GEL Apply 2 g topically 4 (four) times daily. 100 g    furosemide (LASIX) 20 MG tablet Take 1 tablet (20 mg total) by mouth daily as needed for edema. 30 tablet 2   losartan (COZAAR) 50 MG tablet Take 1 tablet (50 mg total) by mouth 2 (two) times daily. 60 tablet 5   rivaroxaban (XARELTO) 20 MG TABS tablet Take 1 tablet (20 mg  total) by mouth daily with supper. 30 tablet 5   rosuvastatin (CRESTOR) 20 MG tablet Take 1 tablet (20 mg total) by mouth daily. 30 tablet 5   spironolactone (ALDACTONE) 25 MG tablet Take 1 tablet (25 mg total) by mouth daily. 30 tablet 5   No current facility-administered medications for this encounter.    No Known Allergies  Social History   Socioeconomic History   Marital status: Married    Spouse name: Not on file   Number of children: Not on file   Years of education: Not on file   Highest education level: Not on file  Occupational History   Not on file  Tobacco Use   Smoking status: Former    Packs/day: 0.20    Years: 24.00    Additional pack years: 0.00    Total pack years: 4.80    Types: Cigarettes    Quit date: 03/17/2014    Years since quitting: 8.4   Smokeless tobacco: Never   Tobacco comments:    Former smoker 07/10/22  Substance and Sexual Activity   Alcohol use: No    Alcohol/week: 1.0 standard drink of alcohol    Types: 1 Standard drinks or equivalent per week    Comment: states used to drink heavily in 1983   Drug use: No   Sexual activity: Not on file  Other Topics Concern   Not on file  Social History Narrative   Moved from [place] to [place] in 2009.  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   Social Determinants of Health   Financial Resource Strain: Low Risk  (03/05/2022)   Overall Financial Resource Strain (CARDIA)    Difficulty of Paying Living Expenses: Not hard at all  Food Insecurity: No Food Insecurity (03/05/2022)   Hunger Vital Sign    Worried About Running Out of Food in the Last Year: Never true    Ran Out of Food in the Last Year: Never true  Transportation Needs: No Transportation Needs (03/05/2022)   PRAPARE - Administrator, Civil Service (Medical): No    Lack of Transportation (Non-Medical): No  Physical Activity: Not on file  Stress: Not on file  Social Connections: Moderately Isolated (03/05/2022)    Social Connection and Isolation Panel [NHANES]    Frequency of Communication with Friends and Family: More than three times a week    Frequency of Social Gatherings with Friends and Family: Not on file    Attends Religious Services: Never    Database administrator or Organizations: No    Attends Banker Meetings: Never    Marital Status: Married  Catering manager Violence: Not At Risk (03/05/2022)   Humiliation, Afraid, Rape, and Kick questionnaire    Fear of Current or Ex-Partner: No    Emotionally Abused: No    Physically Abused: No    Sexually Abused: No     ROS- All systems  are reviewed and negative except as per the HPI above.  Physical Exam: Vitals:   08/13/22 1043  BP: (!) 142/88  Pulse: 62  Weight: 98.8 kg  Height: 6\' 2"  (1.88 m)    GEN- The patient is well appearing, alert and oriented x 3 today.   Head- normocephalic, atraumatic Eyes-  Sclera clear, conjunctiva pink Ears- hearing intact Oropharynx- clear Neck- supple, no JVP Lymph- no cervical lymphadenopathy Lungs- Clear to ausculation bilaterally, normal work of breathing Heart- Regular rate and rhythm, no murmurs, rubs or gallops, PMI not laterally displaced GI- soft, NT, ND, + BS Extremities- no clubbing, cyanosis, or edema MS- no significant deformity or atrophy Skin- no rash or lesion Psych- euthymic mood, full affect Neuro- strength and sensation are intact    Wt Readings from Last 3 Encounters:  08/13/22 98.8 kg  07/10/22 98.4 kg  06/29/22 99.4 kg    EKG today demonstrates  Vent. rate 62 BPM PR interval 158 ms QRS duration 90 ms QT/QTcB 402/408 ms P-R-T axes 69 62 213 Normal sinus rhythm Minimal voltage criteria for LVH, may be normal variant ( Sokolow-Lyon ) ST & Marked T-wave abnormality, consider inferolateral ischemia Abnormal ECG When compared with ECG of 10-Jul-2022 10:52, PREVIOUS ECG IS PRESENT  Echo 01/08/22 demonstrated: 1. Left ventricular ejection fraction, by  estimation, is 60 to 65%. The  left ventricle has normal function. The left ventricle has no regional  wall motion abnormalities. There is moderate concentric left ventricular  hypertrophy. Left ventricular  diastolic parameters are consistent with Grade I diastolic dysfunction  (impaired relaxation). The average left ventricular global longitudinal  strain is -16.2 %. The global longitudinal strain is abnormal.   2. Right ventricular systolic function is normal. The right ventricular  size is normal.   3. The mitral valve is grossly normal. Mild mitral valve regurgitation.   4. The aortic valve is tricuspid. Aortic valve regurgitation is not  visualized. No aortic stenosis is present.   5. Aortic dilatation noted. There is borderline dilatation of the  ascending aorta, measuring 39 mm.  Epic records are reviewed at length today.  CHA2DS2-VASc Score =   2 (HTN and CAD) The patient's score is based upon:         ASSESSMENT AND PLAN: Paroxysmal Atrial Fibrillation (ICD10:  I48.0) The patient's CHA2DS2-VASc score is 2 , indicating a 2.2 % annual risk of stroke.    He is in SR today.  We will continue with conservative observation and I'll plan to see him back in 3 months.   Continue coreg 25 mg BID. Continue Xarelto 20 mg qhs.   2. Secondary Hypercoagulable State (ICD10:  D68.69) The patient is at significant risk for stroke/thromboembolism based upon his CHA2DS2-VASc Score of  .  Continue Rivaroxaban (Xarelto).   Education provided on indication for anticoagulation and emphasized importance of compliance.   3. Obesity Body mass index is 27.96 kg/m. Lifestyle modification was discussed at length including regular exercise and weight reduction. Encouraged daily walking.  4. Snoring with possible obstructive sleep apnea  He does not appear to have strong symptoms for OSA other than occasional snoring. Recommended to confirm with wife he does not stop breathing at  night.  5. HTN Stable today; no changes.   Follow up in 3 months Afib clinic.   Lake Bells, PA-C Af[facility] [address] [place], [place] [address] [phone] 08/13/2022 11:28 AM

## 2022-08-19 ENCOUNTER — Other Ambulatory Visit (HOSPITAL_COMMUNITY): Payer: Self-pay

## 2022-08-27 ENCOUNTER — Ambulatory Visit: Payer: Medicaid Other | Admitting: Internal Medicine

## 2022-08-27 ENCOUNTER — Encounter: Payer: Self-pay | Admitting: Internal Medicine

## 2022-08-27 ENCOUNTER — Other Ambulatory Visit (HOSPITAL_COMMUNITY): Payer: Self-pay

## 2022-08-27 ENCOUNTER — Other Ambulatory Visit: Payer: Self-pay

## 2022-08-27 VITALS — BP 147/95 | HR 54 | Temp 97.8°F | Ht 74.0 in | Wt 218.1 lb

## 2022-08-27 DIAGNOSIS — I480 Paroxysmal atrial fibrillation: Secondary | ICD-10-CM | POA: Diagnosis not present

## 2022-08-27 DIAGNOSIS — I5020 Unspecified systolic (congestive) heart failure: Secondary | ICD-10-CM

## 2022-08-27 DIAGNOSIS — I110 Hypertensive heart disease with heart failure: Secondary | ICD-10-CM | POA: Diagnosis present

## 2022-08-27 DIAGNOSIS — I10 Essential (primary) hypertension: Secondary | ICD-10-CM

## 2022-08-27 MED ORDER — AMLODIPINE BESYLATE 5 MG PO TABS
5.0000 mg | ORAL_TABLET | Freq: Every day | ORAL | 11 refills | Status: DC
  Filled 2022-08-27: qty 30, 30d supply, fill #0

## 2022-08-27 NOTE — Progress Notes (Signed)
Established Patient Office Visit  Subjective   Patient ID: Antonio Ramos, male    DOB: 04-18-1960  Age: 62 y.o. MRN: [id number]  Chief Complaint  Patient presents with   Follow-up   Antonio Ramos is here for follow-up of hypertension and A-fib.  Since her last visit he was briefly admitted for A-fib with RVR and signed out AMA however has followed up twice since then with the A-fib clinic.  He is now on carvedilol 25 mg twice daily along with Xarelto 20 mg daily.  His antihypertensive regimen also includes spironolactone 25 mg and losartan 50 mg twice daily.  He is also taking Crestor 20 mg daily he brings all of these medications with him today he has 2 marked on the bottles that he takes twice a day.  He has not needed to take any Lasix.  He feels well today and has no complaints.     Objective:     BP (!) 147/95 (BP Location: Right Arm, Patient Position: Sitting, Cuff Size: Large)   Pulse (!) 54   Temp 97.8 F (36.6 C) (Oral)   Ht 6\' 2"  (1.88 m)   Wt 218 lb 1.6 oz (98.9 kg)   SpO2 98% Comment: RA  BMI 28.00 kg/m  BP Readings from Last 3 Encounters:  08/27/22 (!) 147/95  08/13/22 (!) 142/88  07/10/22 (!) 138/90   Wt Readings from Last 3 Encounters:  08/27/22 218 lb 1.6 oz (98.9 kg)  08/13/22 217 lb 12.8 oz (98.8 kg)  07/10/22 217 lb (98.4 kg)      Physical Exam Constitutional:      Appearance: Normal appearance.  Cardiovascular:     Rate and Rhythm: Normal rate and regular rhythm.     Heart sounds: No murmur heard. Pulmonary:     Effort: Pulmonary effort is normal.     Breath sounds: Normal breath sounds.  Musculoskeletal:     Right lower leg: No edema.     Left lower leg: No edema.      No results found for any visits on 08/27/22.  Last CBC Lab Results  Component Value Date   WBC 4.8 06/29/2022   HGB 14.2 06/29/2022   HCT 43.3 06/29/2022   MCV 84.4 06/29/2022   MCH 27.7 06/29/2022   RDW 15.0 06/29/2022   PLT 201 06/29/2022   Last metabolic panel Lab  Results  Component Value Date   GLUCOSE 112 (H) 06/29/2022   NA 137 06/29/2022   K 4.0 06/29/2022   CL 105 06/29/2022   CO2 22 06/29/2022   BUN 13 06/29/2022   CREATININE 1.10 06/29/2022   GFRNONAA >60 06/29/2022   CALCIUM 8.9 06/29/2022   PROT 7.1 06/13/2020   ALBUMIN 4.5 06/13/2020   LABGLOB 2.6 06/13/2020   AGRATIO 1.7 06/13/2020   BILITOT <0.2 06/13/2020   ALKPHOS 89 06/13/2020   AST 25 06/13/2020   ALT 26 06/13/2020   ANIONGAP 10 06/29/2022      The ASCVD Risk score (Arnett DK, et al., 2019) failed to calculate for the following reasons:   The patient has a prior MI or stroke diagnosis    Assessment & Plan:   Problem List Items Addressed This Visit       Cardiovascular and Mediastinum   Essential hypertension - Primary (Chronic)    I discussed with him potentially changing from losartan back to [facility] now that he has insurance however he likes his medication and does not want to make a change to it.  His  blood pressure is above goal and therefore I will add amlodipine 5 mg daily we will have him back for a blood pressure recheck at about 3 weeks and I will see him in about 3 months.      Relevant Medications   amLODipine (NORVASC) 5 MG tablet   Paroxysmal atrial fibrillation (HCC) (Chronic)    Appears to be back in sinus rhythm today.  Continue carvedilol 25 mg twice daily and Xarelto 20 mg daily.      Relevant Medications   amLODipine (NORVASC) 5 MG tablet   HFrEF (heart failure with reduced ejection fraction) (HCC) (Chronic)    Stable appears euvolemic on exam.  Continue losartan carvedilol and spironolactone.  Lasix as needed.      Relevant Medications   amLODipine (NORVASC) 5 MG tablet    Return in about 3 months (around 11/27/2022), or 3 months with Dr Mikey Bussing 2-4 weeks for nurse BP check.    Gust Rung, DO

## 2022-08-27 NOTE — Assessment & Plan Note (Signed)
I discussed with him potentially changing from losartan back to [facility] now that he has insurance however he likes his medication and does not want to make a change to it.  His blood pressure is above goal and therefore I will add amlodipine 5 mg daily we will have him back for a blood pressure recheck at about 3 weeks and I will see him in about 3 months.

## 2022-08-27 NOTE — Assessment & Plan Note (Signed)
Stable appears euvolemic on exam.  Continue losartan carvedilol and spironolactone.  Lasix as needed.

## 2022-08-27 NOTE — Assessment & Plan Note (Signed)
Appears to be back in sinus rhythm today.  Continue carvedilol 25 mg twice daily and Xarelto 20 mg daily.

## 2022-09-10 ENCOUNTER — Other Ambulatory Visit (HOSPITAL_COMMUNITY): Payer: Self-pay

## 2022-09-11 ENCOUNTER — Other Ambulatory Visit: Payer: Self-pay | Admitting: *Deleted

## 2022-09-14 ENCOUNTER — Other Ambulatory Visit (HOSPITAL_COMMUNITY): Payer: Self-pay

## 2022-09-14 ENCOUNTER — Other Ambulatory Visit: Payer: Self-pay

## 2022-11-02 ENCOUNTER — Other Ambulatory Visit (HOSPITAL_COMMUNITY): Payer: Self-pay

## 2022-11-12 ENCOUNTER — Ambulatory Visit (HOSPITAL_COMMUNITY): Payer: Medicaid Other | Admitting: Internal Medicine

## 2022-11-16 ENCOUNTER — Ambulatory Visit (HOSPITAL_COMMUNITY): Payer: Medicaid Other | Admitting: Internal Medicine

## 2022-12-24 ENCOUNTER — Other Ambulatory Visit: Payer: Self-pay

## 2022-12-24 ENCOUNTER — Ambulatory Visit: Payer: Medicaid Other | Admitting: Internal Medicine

## 2022-12-24 ENCOUNTER — Other Ambulatory Visit (HOSPITAL_COMMUNITY): Payer: Self-pay

## 2022-12-24 ENCOUNTER — Encounter: Payer: Self-pay | Admitting: Internal Medicine

## 2022-12-24 VITALS — BP 137/92 | HR 58 | Temp 97.7°F | Ht 74.0 in | Wt 210.5 lb

## 2022-12-24 DIAGNOSIS — I5022 Chronic systolic (congestive) heart failure: Secondary | ICD-10-CM | POA: Diagnosis not present

## 2022-12-24 DIAGNOSIS — I480 Paroxysmal atrial fibrillation: Secondary | ICD-10-CM | POA: Diagnosis not present

## 2022-12-24 DIAGNOSIS — I10 Essential (primary) hypertension: Secondary | ICD-10-CM

## 2022-12-24 DIAGNOSIS — I110 Hypertensive heart disease with heart failure: Secondary | ICD-10-CM

## 2022-12-24 DIAGNOSIS — E785 Hyperlipidemia, unspecified: Secondary | ICD-10-CM

## 2022-12-24 DIAGNOSIS — I5020 Unspecified systolic (congestive) heart failure: Secondary | ICD-10-CM

## 2022-12-24 DIAGNOSIS — Z1211 Encounter for screening for malignant neoplasm of colon: Secondary | ICD-10-CM

## 2022-12-24 MED ORDER — AMLODIPINE BESYLATE 5 MG PO TABS
5.0000 mg | ORAL_TABLET | Freq: Every day | ORAL | 3 refills | Status: DC
  Filled 2022-12-24: qty 90, 90d supply, fill #0
  Filled 2023-07-20: qty 30, 30d supply, fill #1

## 2022-12-24 MED ORDER — SPIRONOLACTONE 25 MG PO TABS
25.0000 mg | ORAL_TABLET | Freq: Every day | ORAL | 3 refills | Status: DC
Start: 2022-12-24 — End: 2023-08-26
  Filled 2022-12-24 (×2): qty 90, 90d supply, fill #0
  Filled 2023-07-20: qty 30, 30d supply, fill #1

## 2022-12-24 MED ORDER — CARVEDILOL 25 MG PO TABS
25.0000 mg | ORAL_TABLET | Freq: Two times a day (BID) | ORAL | 3 refills | Status: DC
Start: 2022-12-24 — End: 2023-08-26
  Filled 2022-12-24 (×2): qty 180, 90d supply, fill #0
  Filled 2023-07-20: qty 60, 30d supply, fill #1

## 2022-12-24 MED ORDER — LOSARTAN POTASSIUM 50 MG PO TABS
50.0000 mg | ORAL_TABLET | Freq: Two times a day (BID) | ORAL | 3 refills | Status: DC
Start: 2022-12-24 — End: 2023-08-26
  Filled 2022-12-24 (×2): qty 180, 90d supply, fill #0
  Filled 2023-07-20: qty 60, 30d supply, fill #1

## 2022-12-24 MED ORDER — ROSUVASTATIN CALCIUM 20 MG PO TABS
20.0000 mg | ORAL_TABLET | Freq: Every day | ORAL | 3 refills | Status: DC
  Filled 2022-12-24 (×2): qty 90, 90d supply, fill #0

## 2022-12-24 MED ORDER — RIVAROXABAN 20 MG PO TABS
20.0000 mg | ORAL_TABLET | Freq: Every day | ORAL | 3 refills | Status: DC
  Filled 2022-12-24: qty 90, 90d supply, fill #0

## 2022-12-24 NOTE — Assessment & Plan Note (Signed)
Appears to be in sinus rhythm today continue anticoagulation.

## 2022-12-24 NOTE — Assessment & Plan Note (Signed)
Refill Crestor

## 2022-12-24 NOTE — Assessment & Plan Note (Signed)
Appears well compensated today continue to work on blood pressure adherence.  Not interested in changing from losartan to [facility] due to concerns for increased cost.

## 2022-12-24 NOTE — Assessment & Plan Note (Signed)
Discussed with him plan to continue losartan 50 mg twice daily, spironolactone 25 mg once daily, carvedilol 25 mg twice daily and add amlodipine 5 mg daily.  Continue with Lasix as needed.

## 2022-12-24 NOTE — Patient Instructions (Signed)
Remember to send back the stool cards.

## 2022-12-24 NOTE — Progress Notes (Signed)
Established Patient Office Visit  Subjective   Patient ID: Antonio Ramos, male    DOB: [birth year]  Age: 62 y.o. MRN: [id number]  Chief Complaint  Patient presents with   Check-up Visit   Medication Refill   Here for follow-up of his hypertension.  He brings his medications looks like these are from July last fill.  He does not have amlodipine with him and notes that he forgot to pick that medication up given an issue that came up with his daughter's car after our last visit.  Plans to get influenza vaccine at health work.  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  Still not willing to do a screening colonoscopy will do fit test     Objective:     BP (!) 137/92 (BP Location: Right Arm, Patient Position: Sitting, Cuff Size: Large)   Pulse (!) 58   Temp 97.7 F (36.5 C) (Oral)   Ht 6\' 2"  (1.88 m)   Wt 210 lb 8 oz (95.5 kg)   SpO2 97% Comment: RA  BMI 27.03 kg/m  BP Readings from Last 3 Encounters:  12/24/22 (!) 137/92  08/27/22 (!) 147/95  08/13/22 (!) 142/88   Wt Readings from Last 3 Encounters:  12/24/22 210 lb 8 oz (95.5 kg)  08/27/22 218 lb 1.6 oz (98.9 kg)  08/13/22 217 lb 12.8 oz (98.8 kg)      Physical Exam Constitutional:      Appearance: Normal appearance.  Cardiovascular:     Rate and Rhythm: Normal rate and regular rhythm.  Pulmonary:     Effort: Pulmonary effort is normal.  Neurological:     Mental Status: He is alert.      No results found for any visits on 12/24/22.  Last CBC Lab Results  Component Value Date   WBC 4.8 06/29/2022   HGB 14.2 06/29/2022   HCT 43.3 06/29/2022   MCV 84.4 06/29/2022   MCH 27.7 06/29/2022   RDW 15.0 06/29/2022   PLT 201 06/29/2022   Last metabolic panel Lab Results  Component Value Date   GLUCOSE 112 (H) 06/29/2022   NA 137 06/29/2022   K 4.0 06/29/2022   CL 105 06/29/2022   CO2 22 06/29/2022   BUN 13 06/29/2022   CREATININE 1.10 06/29/2022   GFRNONAA >60 06/29/2022   CALCIUM  8.9 06/29/2022   PROT 7.1 06/13/2020   ALBUMIN 4.5 06/13/2020   LABGLOB 2.6 06/13/2020   AGRATIO 1.7 06/13/2020   BILITOT <0.2 06/13/2020   ALKPHOS 89 06/13/2020   AST 25 06/13/2020   ALT 26 06/13/2020   ANIONGAP 10 06/29/2022   Last lipids Lab Results  Component Value Date   CHOL 115 12/04/2021   HDL 42 12/04/2021   LDLCALC 58 12/04/2021   TRIG 71 12/04/2021   CHOLHDL 2.7 12/04/2021   Last hemoglobin A1c No results found for: "HGBA1C"    The ASCVD Risk score (Arnett DK, et al., 2019) failed to calculate for the following reasons:   The patient has a prior MI or stroke diagnosis    Assessment & Plan:   Problem List Items Addressed This Visit       Cardiovascular and Mediastinum   Essential hypertension (Chronic)    Discussed with him plan to continue losartan 50 mg twice daily, spironolactone 25 mg once daily, carvedilol 25 mg twice daily and add amlodipine 5 mg daily.  Continue with Lasix as needed.      Relevant Medications   amLODipine (NORVASC)  5 MG tablet   carvedilol (COREG) 25 MG tablet   losartan (COZAAR) 50 MG tablet   rivaroxaban (XARELTO) 20 MG TABS tablet   rosuvastatin (CRESTOR) 20 MG tablet   spironolactone (ALDACTONE) 25 MG tablet   Paroxysmal atrial fibrillation (HCC) (Chronic)    Appears to be in sinus rhythm today continue anticoagulation.      Relevant Medications   amLODipine (NORVASC) 5 MG tablet   carvedilol (COREG) 25 MG tablet   losartan (COZAAR) 50 MG tablet   rivaroxaban (XARELTO) 20 MG TABS tablet   rosuvastatin (CRESTOR) 20 MG tablet   spironolactone (ALDACTONE) 25 MG tablet   HFrEF (heart failure with reduced ejection fraction) (HCC) (Chronic)    Appears well compensated today continue to work on blood pressure adherence.  Not interested in changing from losartan to [facility] due to concerns for increased cost.      Relevant Medications   amLODipine (NORVASC) 5 MG tablet   carvedilol (COREG) 25 MG tablet   losartan (COZAAR) 50  MG tablet   rivaroxaban (XARELTO) 20 MG TABS tablet   rosuvastatin (CRESTOR) 20 MG tablet   spironolactone (ALDACTONE) 25 MG tablet     Other   Hyperlipidemia LDL goal <70 (Chronic)    Refill Crestor.      Relevant Medications   amLODipine (NORVASC) 5 MG tablet   carvedilol (COREG) 25 MG tablet   losartan (COZAAR) 50 MG tablet   rivaroxaban (XARELTO) 20 MG TABS tablet   rosuvastatin (CRESTOR) 20 MG tablet   spironolactone (ALDACTONE) 25 MG tablet   Other Visit Diagnoses     Screening for colon cancer    -  Primary   Relevant Orders   Fecal occult blood, imunochemical   Chronic systolic heart failure (HCC)       Relevant Medications   amLODipine (NORVASC) 5 MG tablet   carvedilol (COREG) 25 MG tablet   losartan (COZAAR) 50 MG tablet   rivaroxaban (XARELTO) 20 MG TABS tablet   rosuvastatin (CRESTOR) 20 MG tablet   spironolactone (ALDACTONE) 25 MG tablet       Return in about 3 months (around 03/25/2023).    Gust Rung, DO

## 2022-12-28 ENCOUNTER — Other Ambulatory Visit (HOSPITAL_COMMUNITY): Payer: Self-pay

## 2023-02-04 ENCOUNTER — Other Ambulatory Visit (HOSPITAL_BASED_OUTPATIENT_CLINIC_OR_DEPARTMENT_OTHER): Payer: Self-pay

## 2023-02-04 MED ORDER — INFLUENZA VIRUS VACC SPLIT PF (FLUZONE) 0.5 ML IM SUSY
0.5000 mL | PREFILLED_SYRINGE | Freq: Once | INTRAMUSCULAR | 0 refills | Status: AC
  Filled 2023-02-04: qty 0.5, 1d supply, fill #0

## 2023-05-27 ENCOUNTER — Other Ambulatory Visit (HOSPITAL_COMMUNITY): Payer: Self-pay

## 2023-06-19 IMAGING — DX DG CHEST 1V PORT
1 series · 1 of 1 positions shown · non-contrast
Comparison: 10/02/2015

CLINICAL DATA: Dyspnea

EXAM:
PORTABLE CHEST 1 VIEW

[chest ap]
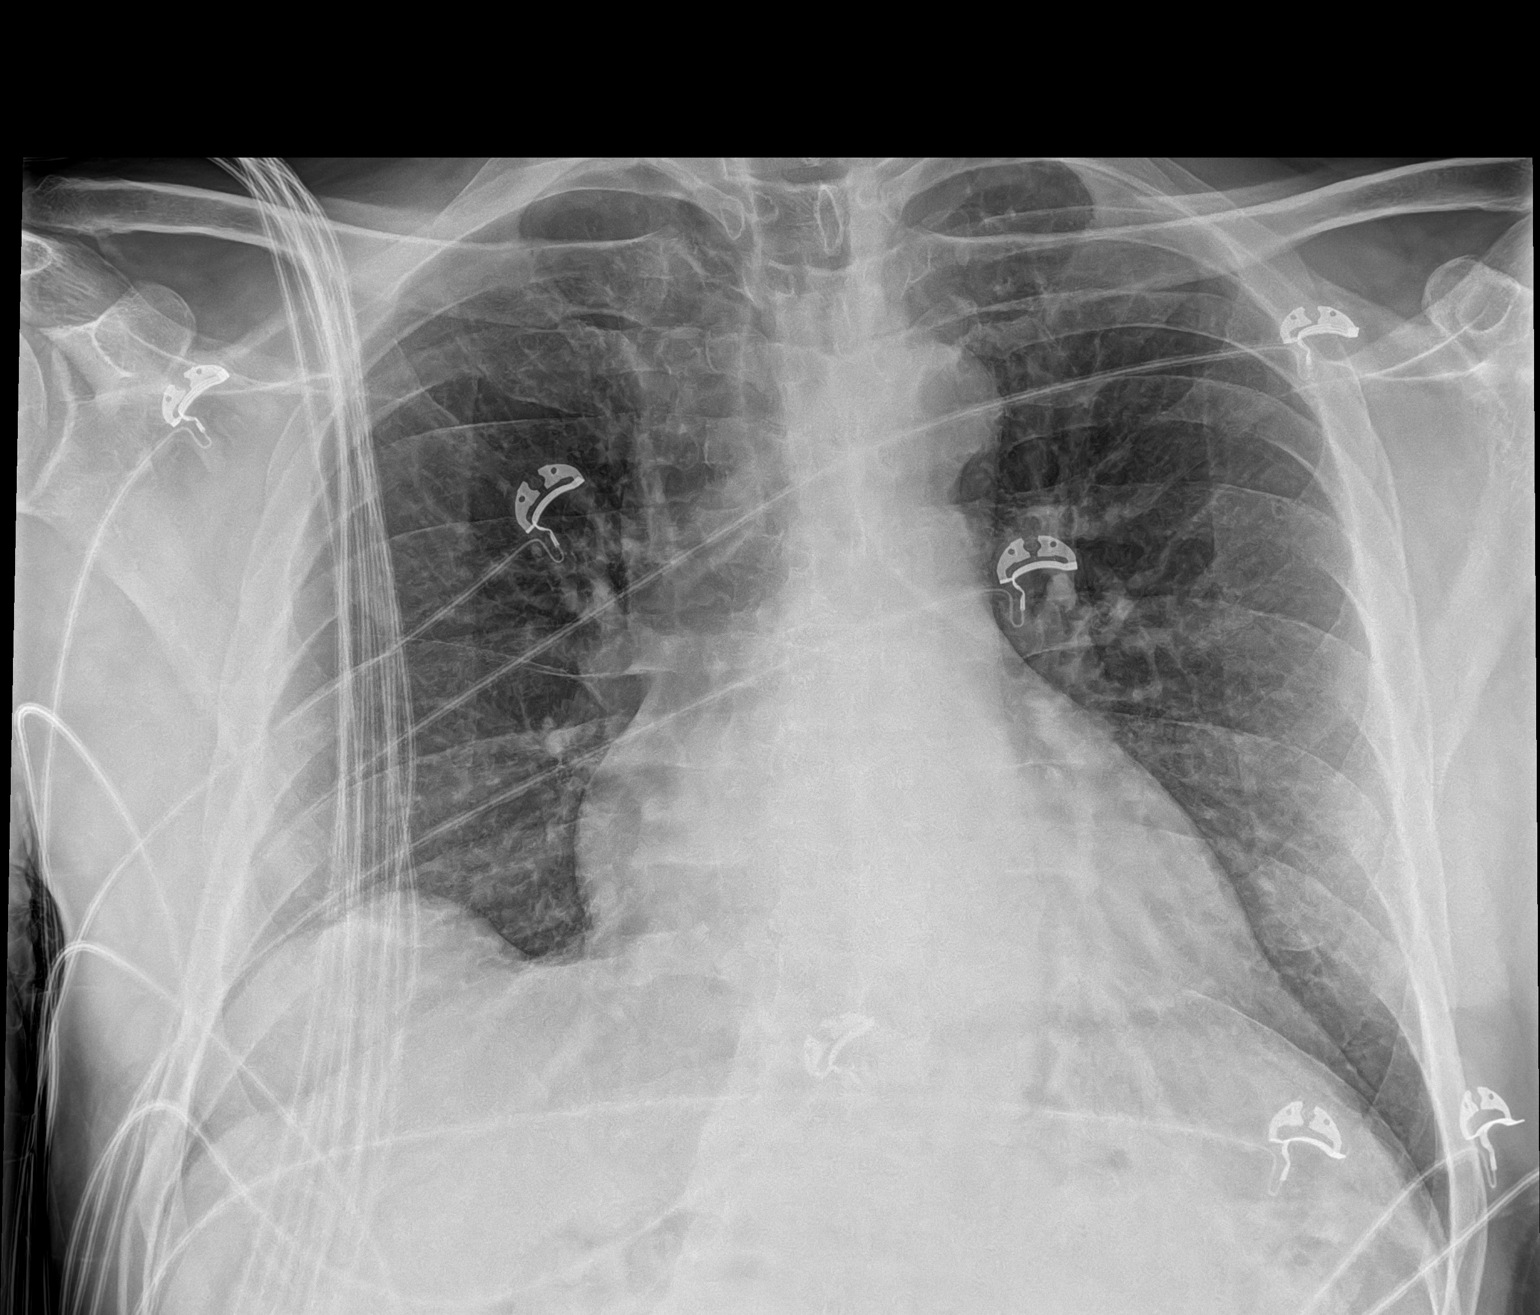

[1 of 1 positions shown; findings below may reference images not displayed]

FINDINGS: The heart size and mediastinal contours are within normal limits.
Both lungs are clear. The visualized skeletal structures are
unremarkable.
IMPRESSION: No active disease.

## 2023-06-24 ENCOUNTER — Ambulatory Visit: Admitting: Internal Medicine

## 2023-06-24 ENCOUNTER — Encounter: Payer: Self-pay | Admitting: Internal Medicine

## 2023-06-24 VITALS — BP 116/86 | HR 67 | Temp 97.9°F | Ht 74.0 in | Wt 205.1 lb

## 2023-06-24 DIAGNOSIS — I480 Paroxysmal atrial fibrillation: Secondary | ICD-10-CM | POA: Diagnosis not present

## 2023-06-24 DIAGNOSIS — I5020 Unspecified systolic (congestive) heart failure: Secondary | ICD-10-CM

## 2023-06-24 DIAGNOSIS — I110 Hypertensive heart disease with heart failure: Secondary | ICD-10-CM

## 2023-06-24 DIAGNOSIS — E785 Hyperlipidemia, unspecified: Secondary | ICD-10-CM

## 2023-06-24 DIAGNOSIS — Z0000 Encounter for general adult medical examination without abnormal findings: Secondary | ICD-10-CM

## 2023-06-24 DIAGNOSIS — I10 Essential (primary) hypertension: Secondary | ICD-10-CM

## 2023-06-24 DIAGNOSIS — I4821 Permanent atrial fibrillation: Secondary | ICD-10-CM

## 2023-06-24 DIAGNOSIS — D6869 Other thrombophilia: Secondary | ICD-10-CM

## 2023-06-24 NOTE — Assessment & Plan Note (Signed)
 Recommended shingles vaccine declined.  Notes he still has a fit test that he needs to submit not interested in Cologuard.

## 2023-06-24 NOTE — Assessment & Plan Note (Signed)
 BP well controlled.  Continue losartan 50 mg twice daily spironolactone 25 mg daily, carvedilol 25 mg twice daily and amlodipine 5 mg daily

## 2023-06-24 NOTE — Assessment & Plan Note (Signed)
Xarelto 20 mg daily

## 2023-06-24 NOTE — Assessment & Plan Note (Signed)
 Appears to be in sinus rhythm today we will continue carvedilol and Xarelto

## 2023-06-24 NOTE — Progress Notes (Signed)
 Established Patient Office Visit  Subjective   Patient ID: Antonio Ramos, male    DOB: 1961/04/01  Age: 63 y.o. MRN: [id number]  Chief Complaint  Patient presents with   6 Month F/U Visit   Antonio Ramos is here for follow-up of his hypertension and congestive heart failure.  He is actually feeling quite well he is enjoying his new work.  He reports taking all of his medications as prescribed and brings in with him.  Refill history is suboptimal but he does have all of his medications except for rosuvastatin with him.  He notes occasional right shoulder pain especially with rolling on and sleeping on his right side he is still using Voltaren intermittently and in general this does help resolve the shoulder pain.  He notes that his right third finger will occasionally stick and he feels a small mound in his palm.    Objective:     BP 116/86 (BP Location: Right Arm, Patient Position: Sitting, Cuff Size: Large)   Pulse 67   Temp 97.9 F (36.6 C) (Oral)   Ht 6\' 2"  (1.88 m)   Wt 205 lb 1.6 oz (93 kg)   SpO2 100% Comment: RA  BMI 26.33 kg/m  BP Readings from Last 3 Encounters:  06/24/23 116/86  12/24/22 (!) 137/92  08/27/22 (!) 147/95   Wt Readings from Last 3 Encounters:  06/24/23 205 lb 1.6 oz (93 kg)  12/24/22 210 lb 8 oz (95.5 kg)  08/27/22 218 lb 1.6 oz (98.9 kg)      Physical Exam Vitals and nursing note reviewed.  Constitutional:      Appearance: Normal appearance.  Cardiovascular:     Rate and Rhythm: Normal rate and regular rhythm.     Heart sounds: No murmur heard. Pulmonary:     Effort: Pulmonary effort is normal.     Breath sounds: Normal breath sounds.  Musculoskeletal:     Right lower leg: No edema.     Left lower leg: No edema.     Comments: Small palpable nodule over right 3rd finger tendon sheath, no current locking of the finger with flexion/extension.  Right shoulder full aROM, mild TTP at lateral aspect/ subaccromial region  Neurological:     Mental  Status: He is alert.  Psychiatric:        Mood and Affect: Mood normal.      No results found for any visits on 06/24/23.  Last metabolic panel Lab Results  Component Value Date   GLUCOSE 112 (H) 06/29/2022   NA 137 06/29/2022   K 4.0 06/29/2022   CL 105 06/29/2022   CO2 22 06/29/2022   BUN 13 06/29/2022   CREATININE 1.10 06/29/2022   GFRNONAA >60 06/29/2022   CALCIUM 8.9 06/29/2022   PROT 7.1 06/13/2020   ALBUMIN 4.5 06/13/2020   LABGLOB 2.6 06/13/2020   AGRATIO 1.7 06/13/2020   BILITOT <0.2 06/13/2020   ALKPHOS 89 06/13/2020   AST 25 06/13/2020   ALT 26 06/13/2020   ANIONGAP 10 06/29/2022   Last vitamin D No results found for: "25OHVITD2", "25OHVITD3", "VD25OH"    The ASCVD Risk score (Arnett DK, et al., 2019) failed to calculate for the following reasons:   Risk score cannot be calculated because patient has a medical history suggesting prior/existing ASCVD    Assessment & Plan:   Problem List Items Addressed This Visit       Cardiovascular and Mediastinum   Essential hypertension - Primary (Chronic)   BP well controlled.  Continue losartan 50 mg twice daily spironolactone 25 mg daily, carvedilol 25 mg twice daily and amlodipine 5 mg daily      Relevant Orders   CBC no Diff   Paroxysmal atrial fibrillation (HCC) (Chronic)   Appears to be in sinus rhythm today we will continue carvedilol and Xarelto      Hypercoagulable state due to permanent atrial fibrillation (HCC) (Chronic)   Xarelto 20 mg daily      HFrEF (heart failure with reduced ejection fraction) (HCC) (Chronic)   Euvolemic on exam today.  No changes to medical management.        Other   Preventative health care (Chronic)   Recommended shingles vaccine declined.  Notes he still has a fit test that he needs to submit not interested in Cologuard.      Hyperlipidemia LDL goal <70 (Chronic)   Appears he has not been taking Crestor will recheck lipid panel and asked him to pick up  medication from the pharmacy.      Relevant Orders   CMP14 + Anion Gap   Lipid Profile    Return in about 6 months (around 12/25/2023).    Gust Rung, DO

## 2023-06-24 NOTE — Assessment & Plan Note (Signed)
 Appears he has not been taking Crestor will recheck lipid panel and asked him to pick up medication from the pharmacy.

## 2023-06-24 NOTE — Assessment & Plan Note (Signed)
 Euvolemic on exam today.  No changes to medical management.

## 2023-06-25 LAB — CBC
Hematocrit: 44.1 % (ref 37.5–51.0)
Hemoglobin: 14.4 g/dL (ref 13.0–17.7)
MCH: 28 pg (ref 26.6–33.0)
MCHC: 32.7 g/dL (ref 31.5–35.7)
MCV: 86 fL (ref 79–97)
Platelets: 229 10*3/uL (ref 150–450)
RBC: 5.15 x10E6/uL (ref 4.14–5.80)
RDW: 13.4 % (ref 11.6–15.4)
WBC: 5.8 10*3/uL (ref 3.4–10.8)

## 2023-06-25 LAB — LIPID PANEL
Chol/HDL Ratio: 5.1 ratio — ABNORMAL HIGH (ref 0.0–5.0)
Cholesterol, Total: 225 mg/dL — ABNORMAL HIGH (ref 100–199)
HDL: 44 mg/dL (ref >39)
LDL Chol Calc (NIH): 169 mg/dL — ABNORMAL HIGH (ref 0–99)
Triglycerides: 70 mg/dL (ref 0–149)
VLDL Cholesterol Cal: 12 mg/dL (ref 5–40)

## 2023-06-25 LAB — CMP14 + ANION GAP
ALT: 21 IU/L (ref 0–44)
AST: 24 IU/L (ref 0–40)
Albumin: 4.5 g/dL (ref 3.9–4.9)
Alkaline Phosphatase: 95 IU/L (ref 44–121)
Anion Gap: 14 mmol/L (ref 10.0–18.0)
BUN/Creatinine Ratio: 14 (ref 10–24)
BUN: 15 mg/dL (ref 8–27)
Bilirubin Total: 0.4 mg/dL (ref 0.0–1.2)
CO2: 23 mmol/L (ref 20–29)
Calcium: 9.5 mg/dL (ref 8.6–10.2)
Chloride: 104 mmol/L (ref 96–106)
Creatinine, Ser: 1.07 mg/dL (ref 0.76–1.27)
Globulin, Total: 2.9 g/dL (ref 1.5–4.5)
Glucose: 97 mg/dL (ref 70–99)
Potassium: 4.4 mmol/L (ref 3.5–5.2)
Sodium: 141 mmol/L (ref 134–144)
Total Protein: 7.4 g/dL (ref 6.0–8.5)
eGFR: 77 mL/min/{1.73_m2} (ref >59)

## 2023-07-20 ENCOUNTER — Other Ambulatory Visit (HOSPITAL_COMMUNITY): Payer: Self-pay

## 2023-08-11 IMAGING — DX DG FOOT COMPLETE 3+V*L*
3 series · 3 of 3 positions shown · non-contrast
Comparison: None Available.

CLINICAL DATA: Left anterior foot pain starting today. No specific
injury.

EXAM:
LEFT FOOT - COMPLETE 3+ VIEW

[foot ap]
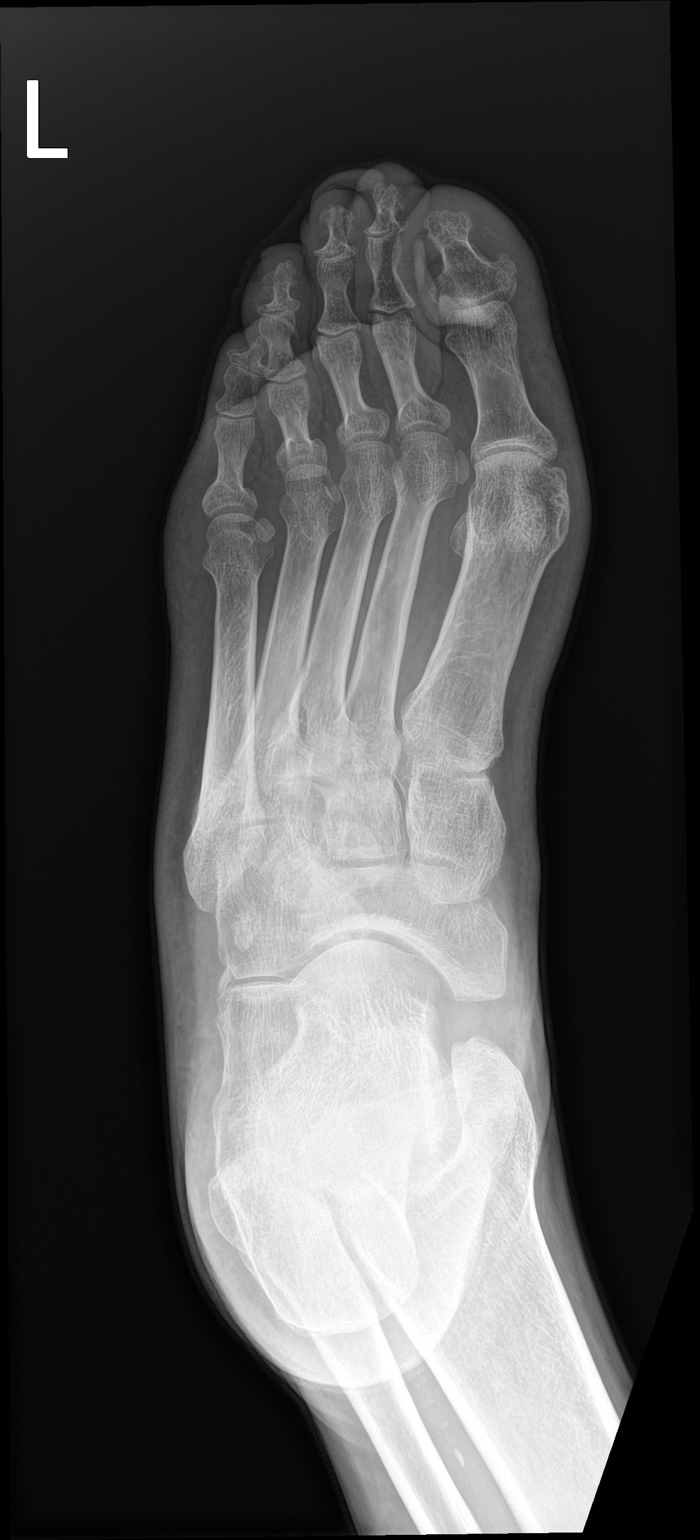

[foot obl]
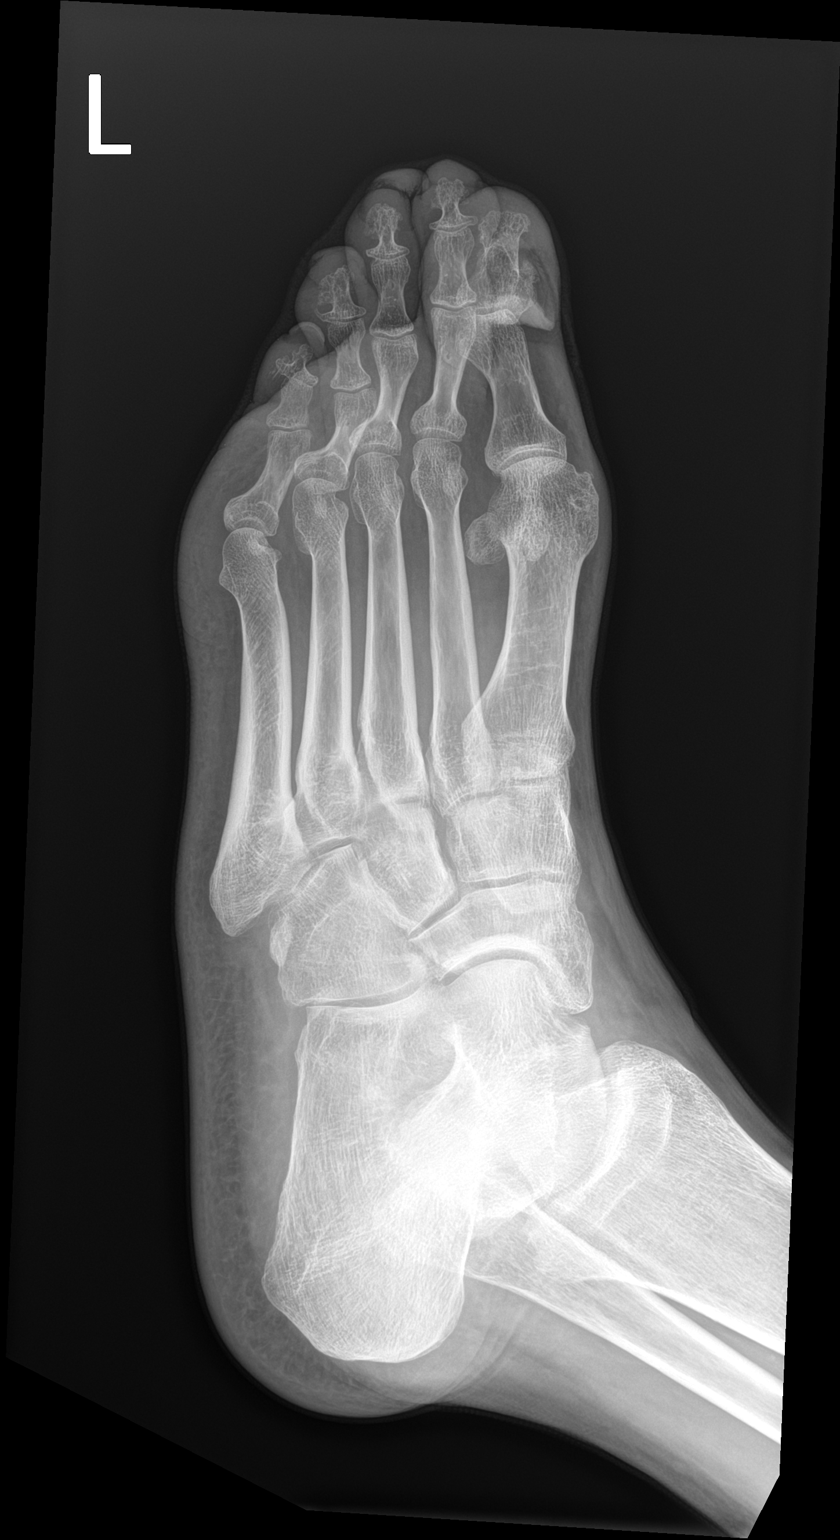

[foot lat]
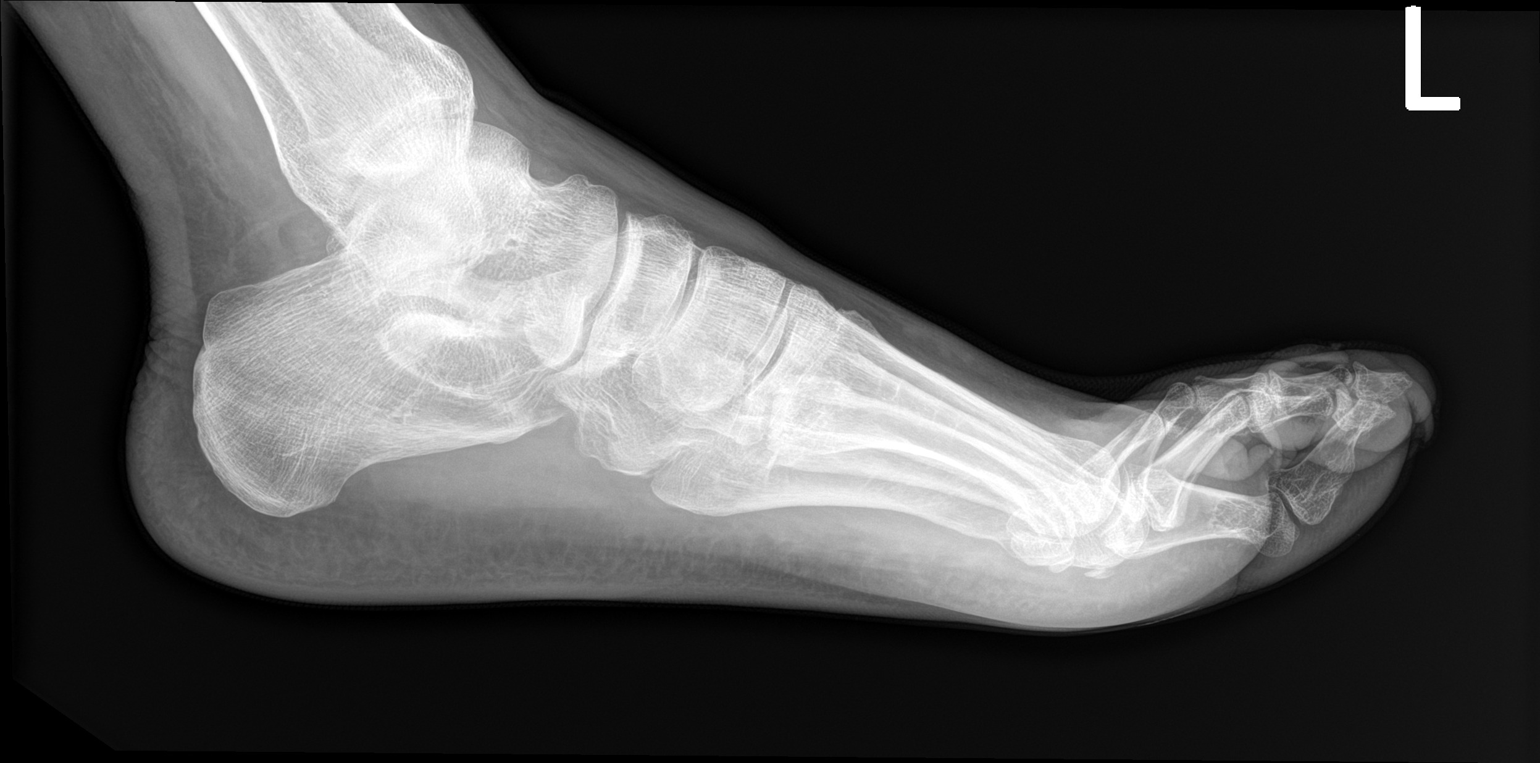

[3 of 3 positions shown; findings below may reference images not displayed]

FINDINGS: Moderate hallux valgus deformity with degenerative changes in the
first metatarsal-phalangeal joint. Degenerative changes in the
interphalangeal and intertarsal joints. No evidence of acute
fracture or dislocation. No focal bone lesion or bone destruction.
Soft tissues are unremarkable.
IMPRESSION: Degenerative changes.  No acute displaced fractures identified.

## 2023-08-26 ENCOUNTER — Other Ambulatory Visit: Payer: Self-pay

## 2023-08-26 ENCOUNTER — Ambulatory Visit (INDEPENDENT_AMBULATORY_CARE_PROVIDER_SITE_OTHER): Admitting: Internal Medicine

## 2023-08-26 ENCOUNTER — Encounter: Payer: Self-pay | Admitting: Internal Medicine

## 2023-08-26 VITALS — BP 148/93 | HR 65 | Temp 97.7°F | Ht 74.0 in | Wt 208.2 lb

## 2023-08-26 DIAGNOSIS — I10 Essential (primary) hypertension: Secondary | ICD-10-CM

## 2023-08-26 DIAGNOSIS — I5022 Chronic systolic (congestive) heart failure: Secondary | ICD-10-CM

## 2023-08-26 DIAGNOSIS — E785 Hyperlipidemia, unspecified: Secondary | ICD-10-CM

## 2023-08-26 DIAGNOSIS — M7541 Impingement syndrome of right shoulder: Secondary | ICD-10-CM | POA: Diagnosis present

## 2023-08-26 DIAGNOSIS — I480 Paroxysmal atrial fibrillation: Secondary | ICD-10-CM | POA: Diagnosis not present

## 2023-08-26 DIAGNOSIS — I110 Hypertensive heart disease with heart failure: Secondary | ICD-10-CM | POA: Diagnosis not present

## 2023-08-26 DIAGNOSIS — I5020 Unspecified systolic (congestive) heart failure: Secondary | ICD-10-CM

## 2023-08-26 MED ORDER — LOSARTAN POTASSIUM 50 MG PO TABS
50.0000 mg | ORAL_TABLET | Freq: Two times a day (BID) | ORAL | 3 refills | Status: AC
  Filled 2023-08-26 – 2023-09-15 (×2): qty 180, 90d supply, fill #0
  Filled 2024-04-25 – 2024-05-10 (×2): qty 180, 90d supply, fill #1

## 2023-08-26 MED ORDER — AMLODIPINE BESYLATE 5 MG PO TABS
5.0000 mg | ORAL_TABLET | Freq: Every day | ORAL | 3 refills | Status: AC
  Filled 2023-08-26 – 2023-09-15 (×2): qty 90, 90d supply, fill #0
  Filled 2024-04-25 – 2024-05-10 (×2): qty 90, 90d supply, fill #1

## 2023-08-26 MED ORDER — ROSUVASTATIN CALCIUM 20 MG PO TABS
20.0000 mg | ORAL_TABLET | Freq: Every day | ORAL | 3 refills | Status: AC
  Filled 2023-08-26 – 2023-09-15 (×2): qty 90, 90d supply, fill #0
  Filled 2024-05-10: qty 90, 90d supply, fill #1

## 2023-08-26 MED ORDER — TRIAMCINOLONE ACETONIDE 40 MG/ML IJ SUSP
40.0000 mg | Freq: Once | INTRAMUSCULAR | Status: AC
Start: 2023-08-26 — End: 2023-08-26
  Administered 2023-08-26: 40 mg via INTRA_ARTICULAR

## 2023-08-26 MED ORDER — CARVEDILOL 25 MG PO TABS
25.0000 mg | ORAL_TABLET | Freq: Two times a day (BID) | ORAL | 3 refills | Status: AC
  Filled 2023-08-26 – 2023-09-15 (×2): qty 180, 90d supply, fill #0
  Filled 2024-05-10: qty 180, 90d supply, fill #1

## 2023-08-26 MED ORDER — SPIRONOLACTONE 25 MG PO TABS
25.0000 mg | ORAL_TABLET | Freq: Every day | ORAL | 3 refills | Status: AC
  Filled 2023-08-26 – 2023-09-15 (×2): qty 90, 90d supply, fill #0
  Filled 2024-04-25 – 2024-05-10 (×2): qty 90, 90d supply, fill #1

## 2023-08-26 MED ORDER — RIVAROXABAN 20 MG PO TABS
20.0000 mg | ORAL_TABLET | Freq: Every day | ORAL | 3 refills | Status: AC
  Filled 2023-08-26 – 2023-10-22 (×4): qty 90, 90d supply, fill #0
  Filled 2024-05-10: qty 90, 90d supply, fill #1

## 2023-08-26 MED ORDER — LIDOCAINE HCL (PF) 1 % IJ SOLN
2.0000 mL | Freq: Once | INTRAMUSCULAR | Status: AC
  Administered 2023-08-26: 2 mL

## 2023-08-26 NOTE — Assessment & Plan Note (Signed)
 Will plan no changes in his antihypertensive regiment encouraged adherence.  Continue amlodipine  5 mg daily carvedilol  25 mg twice daily losartan  50 mg twice daily and spironolactone  25 mg daily

## 2023-08-26 NOTE — Patient Instructions (Signed)
 Keep taking your BP medications every day

## 2023-08-26 NOTE — Assessment & Plan Note (Signed)
 Appears euvolemic on exam continue as needed Lasix  dosing GDMT as able and afforded

## 2023-08-26 NOTE — Assessment & Plan Note (Signed)
 Has not been taking Crestor .  Reports he already takes enough medications.  Asked me what I think about it I told him I think would be a good idea he wants prescription refilled and says he will take it.

## 2023-08-26 NOTE — Assessment & Plan Note (Signed)
 PROCEDURE NOTE  PROCEDURE: right shoulder joint steroid injection.  PREOPERATIVE DIAGNOSIS: Bursitis of the right shoulder.  POSTOPERATIVE DIAGNOSIS: Bursitis of the right shoulder.  PROCEDURE: The patient was apprised of the risks and the benefits of the procedure and informed consent was obtained. Time-out procedure was performed, with confirmation of the patient's name, date of birth, and correct identification of the right shoulder to be injected. The patient's shoulder was then marked at the appropriate site for injection placement. The shoulder was sterilely prepped with Betadine. A 40 mg (1 milliliter) solution of Kenalog  was drawn up into a 3 mL syringe with a 2 mL of 1% lidocaine. The patient was injected with a 25 gauge needle at the lateral  aspect of his  right shoulder. There were no complications. The patient tolerated the procedure well. There was minimal bleeding. The patient was instructed to ice her shoulder upon leaving clinic and refrain from overuse over the next 3 days. The patient was instructed to go to the emergency room with any usual pain, swelling, or redness occurred in the injected area. The patient was given a followup appointment to evaluate response to the injection to his increased range of motion and reduction of pain.

## 2023-08-26 NOTE — Progress Notes (Signed)
 Established Patient Office Visit  Subjective   Patient ID: Antonio Ramos, male    DOB: 1960-07-02  Age: 63 y.o. MRN: [id number]  Chief Complaint  Patient presents with   Right shoulder pain    Mainly at night   Antonio Ramos returns today for follow-up of his blood pressure and right shoulder pain.  For his blood pressure he reports he has been taking his blood pressure medications as directed.  He brings his medications with him and I have reviewed pharmacy claim data.  His pill bottles with him were filled 4/15 and he still has about a third of the bottle left.  In discussing with this with him he notes that he does occasionally miss doses but does try to take it as soon as he removed remembers and he did take it this morning.  Blood pressure at last visit was well-controlled.  For his shoulder pain this has been a chronic issue due to shoulder impingement from rotator cuff tendinopathy.  He has been using Voltaren  gel nightly and it just has not been working quite as well.  He notes that it is worse at night when sleeping on the shoulder.  He is interested in another steroid injection.     Objective:     BP (!) 148/93 (BP Location: Left Arm, Patient Position: Sitting, Cuff Size: Normal)   Pulse 65   Temp 97.7 F (36.5 C) (Oral)   Ht 6\' 2"  (1.88 m)   Wt 208 lb 3.2 oz (94.4 kg)   SpO2 94% Comment: RA  BMI 26.73 kg/m  BP Readings from Last 3 Encounters:  08/26/23 (!) 148/93  06/24/23 116/86  12/24/22 (!) 137/92   Wt Readings from Last 3 Encounters:  08/26/23 208 lb 3.2 oz (94.4 kg)  06/24/23 205 lb 1.6 oz (93 kg)  12/24/22 210 lb 8 oz (95.5 kg)      Physical Exam Vitals and nursing note reviewed.  Constitutional:      General: He is not in acute distress. Pulmonary:     Effort: Pulmonary effort is normal.  Musculoskeletal:     Right shoulder: Tenderness present. No swelling, deformity or crepitus. Decreased range of motion.     Left shoulder: No swelling, deformity,  tenderness or crepitus. Normal range of motion.     Right lower leg: No edema.     Left lower leg: No edema.     Comments: R shoulder painful ARC  Psychiatric:        Mood and Affect: Mood normal.      No results found for any visits on 08/26/23.  Last metabolic panel Lab Results  Component Value Date   GLUCOSE 97 06/24/2023   NA 141 06/24/2023   K 4.4 06/24/2023   CL 104 06/24/2023   CO2 23 06/24/2023   BUN 15 06/24/2023   CREATININE 1.07 06/24/2023   EGFR 77 06/24/2023   CALCIUM  9.5 06/24/2023   PROT 7.4 06/24/2023   ALBUMIN 4.5 06/24/2023   LABGLOB 2.9 06/24/2023   AGRATIO 1.7 06/13/2020   BILITOT 0.4 06/24/2023   ALKPHOS 95 06/24/2023   AST 24 06/24/2023   ALT 21 06/24/2023   ANIONGAP 10 06/29/2022      The ASCVD Risk score (Arnett DK, et Antonio., 2019) failed to calculate for the following reasons:   Risk score cannot be calculated because patient has a medical history suggesting prior/existing ASCVD    Assessment & Plan:   Problem List Items Addressed This Visit  Cardiovascular and Mediastinum   Essential hypertension (Chronic)   Will plan no changes in his antihypertensive regiment encouraged adherence.  Continue amlodipine  5 mg daily carvedilol  25 mg twice daily losartan  50 mg twice daily and spironolactone  25 mg daily      Relevant Medications   rosuvastatin  (CRESTOR ) 20 MG tablet   rivaroxaban  (XARELTO ) 20 MG TABS tablet   spironolactone  (ALDACTONE ) 25 MG tablet   losartan  (COZAAR ) 50 MG tablet   carvedilol  (COREG ) 25 MG tablet   amLODipine  (NORVASC ) 5 MG tablet   Paroxysmal atrial fibrillation (HCC) (Chronic)   Reminded about adherence to Xarelto  for stroke prevention.  Bottle of Xarelto  is from September 2024      Relevant Medications   rosuvastatin  (CRESTOR ) 20 MG tablet   rivaroxaban  (XARELTO ) 20 MG TABS tablet   spironolactone  (ALDACTONE ) 25 MG tablet   losartan  (COZAAR ) 50 MG tablet   carvedilol  (COREG ) 25 MG tablet   amLODipine   (NORVASC ) 5 MG tablet   HFrEF (heart failure with reduced ejection fraction) (HCC) (Chronic)   Appears euvolemic on exam continue as needed Lasix  dosing GDMT as able and afforded      Relevant Medications   rosuvastatin  (CRESTOR ) 20 MG tablet   rivaroxaban  (XARELTO ) 20 MG TABS tablet   spironolactone  (ALDACTONE ) 25 MG tablet   losartan  (COZAAR ) 50 MG tablet   carvedilol  (COREG ) 25 MG tablet   amLODipine  (NORVASC ) 5 MG tablet     Musculoskeletal and Integument   Shoulder impingement syndrome, right - Primary   PROCEDURE NOTE  PROCEDURE: right shoulder joint steroid injection.  PREOPERATIVE DIAGNOSIS: Bursitis of the right shoulder.  POSTOPERATIVE DIAGNOSIS: Bursitis of the right shoulder.  PROCEDURE: The patient was apprised of the risks and the benefits of the procedure and informed consent was obtained. Time-out procedure was performed, with confirmation of the patient's name, date of birth, and correct identification of the right shoulder to be injected. The patient's shoulder was then marked at the appropriate site for injection placement. The shoulder was sterilely prepped with Betadine. A 40 mg (1 milliliter) solution of Kenalog  was drawn up into a 3 mL syringe with a 2 mL of 1% lidocaine. The patient was injected with a 25 gauge needle at the lateral  aspect of his  right shoulder. There were no complications. The patient tolerated the procedure well. There was minimal bleeding. The patient was instructed to ice her shoulder upon leaving clinic and refrain from overuse over the next 3 days. The patient was instructed to go to the emergency room with any usual pain, swelling, or redness occurred in the injected area. The patient was given a followup appointment to evaluate response to the injection to his increased range of motion and reduction of pain.           Other   Hyperlipidemia LDL goal <70 (Chronic)   Has not been taking Crestor .  Reports he already takes enough  medications.  Asked me what I think about it I told him I think would be a good idea he wants prescription refilled and says he will take it.      Relevant Medications   rosuvastatin  (CRESTOR ) 20 MG tablet   rivaroxaban  (XARELTO ) 20 MG TABS tablet   spironolactone  (ALDACTONE ) 25 MG tablet   losartan  (COZAAR ) 50 MG tablet   carvedilol  (COREG ) 25 MG tablet   amLODipine  (NORVASC ) 5 MG tablet   Other Visit Diagnoses       Chronic systolic heart failure (HCC)  Relevant Medications   rosuvastatin  (CRESTOR ) 20 MG tablet   rivaroxaban  (XARELTO ) 20 MG TABS tablet   spironolactone  (ALDACTONE ) 25 MG tablet   losartan  (COZAAR ) 50 MG tablet   carvedilol  (COREG ) 25 MG tablet   amLODipine  (NORVASC ) 5 MG tablet       Return in about 3 months (around 11/26/2023) for HTN.    Priscella Brooms, DO

## 2023-08-26 NOTE — Assessment & Plan Note (Signed)
 Reminded about adherence to Xarelto  for stroke prevention.  Bottle of Xarelto  is from September 2024

## 2023-09-07 ENCOUNTER — Other Ambulatory Visit: Payer: Self-pay

## 2023-09-15 ENCOUNTER — Other Ambulatory Visit (HOSPITAL_COMMUNITY): Payer: Self-pay

## 2023-09-27 ENCOUNTER — Other Ambulatory Visit (HOSPITAL_COMMUNITY): Payer: Self-pay

## 2023-10-22 ENCOUNTER — Other Ambulatory Visit (HOSPITAL_COMMUNITY): Payer: Self-pay

## 2023-12-27 ENCOUNTER — Encounter: Payer: Self-pay | Admitting: Internal Medicine

## 2023-12-27 ENCOUNTER — Ambulatory Visit: Admitting: Internal Medicine

## 2023-12-27 VITALS — BP 154/103 | HR 62 | Temp 97.9°F | Ht 74.0 in | Wt 211.8 lb

## 2023-12-27 DIAGNOSIS — I5022 Chronic systolic (congestive) heart failure: Secondary | ICD-10-CM

## 2023-12-27 DIAGNOSIS — M7541 Impingement syndrome of right shoulder: Secondary | ICD-10-CM

## 2023-12-27 DIAGNOSIS — Z1211 Encounter for screening for malignant neoplasm of colon: Secondary | ICD-10-CM

## 2023-12-27 DIAGNOSIS — Z23 Encounter for immunization: Secondary | ICD-10-CM | POA: Diagnosis not present

## 2023-12-27 DIAGNOSIS — I480 Paroxysmal atrial fibrillation: Secondary | ICD-10-CM

## 2023-12-27 DIAGNOSIS — I10 Essential (primary) hypertension: Secondary | ICD-10-CM

## 2023-12-27 DIAGNOSIS — I110 Hypertensive heart disease with heart failure: Secondary | ICD-10-CM | POA: Diagnosis not present

## 2023-12-27 DIAGNOSIS — E785 Hyperlipidemia, unspecified: Secondary | ICD-10-CM

## 2023-12-27 MED ORDER — LIDOCAINE HCL (PF) 1 % IJ SOLN
2.0000 mL | Freq: Once | INTRAMUSCULAR | Status: AC
  Administered 2023-12-27: 2 mL

## 2023-12-27 MED ORDER — TRIAMCINOLONE ACETONIDE 40 MG/ML IJ SUSP
40.0000 mg | Freq: Once | INTRAMUSCULAR | Status: AC
  Administered 2023-12-27: 40 mg via INTRA_ARTICULAR

## 2023-12-27 NOTE — Progress Notes (Signed)
 Subjective:  HPI: Chief Complaint  Patient presents with   Shoulder injection   Flu Vaccine    Discussed the use of AI scribe software for clinical note transcription with the patient, who gave verbal consent to proceed.  History of Present Illness Antonio Ramos is a 63 year old male who presents with right shoulder pain.  He has been experiencing persistent right shoulder pain for the past week and a half, which is worse than his left shoulder. The pain began after an incident involving a wrench, during which he felt a snapping sensation. He has been using Voltaren  cream and patches with minimal relief. Previous steroid injections have provided temporary relief for a couple of months, but the pain has returned.  He takes multiple medications daily, including amlodipine , carvedilol , losartan , Xarelto , Crestor , and spironolactone . He takes four pills in the morning and three at night and is diligent about picking up his prescriptions from a pharmacy near his home.     Please see Assessment and Plan below for the status of his chronic medical problems.  Objective:  Physical Exam: Vitals:   12/27/23 0841 12/27/23 0912  BP: (!) 163/96 (!) 154/103  Pulse: 65 62  Temp: 97.9 F (36.6 C)   TempSrc: Oral   SpO2: 100%   Weight: 211 lb 12.8 oz (96.1 kg)   Height: 6' 2 (1.88 m)    Body mass index is 27.19 kg/m. Physical Exam Cardiovascular:     Rate and Rhythm: Normal rate and regular rhythm.  Pulmonary:     Effort: Pulmonary effort is normal.     Breath sounds: Normal breath sounds.  Musculoskeletal:     Comments: + neers right shoulder, afull ROM but painfull arc.  Left shoulder full aROM  Neurological:     Mental Status: He is alert.    Results  No results found for any visits on 12/27/23.  The ASCVD Risk score (Arnett DK, et al., 2019) failed to calculate for the following reasons:   Risk score cannot be calculated because patient has a medical history suggesting  prior/existing ASCVD  Assessment & Plan:  See Encounters Tab for problem based charting. Problem List Items Addressed This Visit       Cardiovascular and Mediastinum   Essential hypertension (Chronic)   BP elevated, pharm records show recent fills but overall suboptimal for the year.  Stressed importance of med adherence, patient will monitor home BP readings and bring in records, also bring all meds to next vsiit.      Paroxysmal atrial fibrillation (HCC) (Chronic)   Continue coreg  and xarelto         Musculoskeletal and Integument   Shoulder impingement syndrome, right - Primary   R shoulder injection preformed today        Other   Hyperlipidemia LDL goal <70 (Chronic)   Continue crestor       Other Visit Diagnoses       Chronic systolic heart failure (HCC)         Screening for colon cancer       Relevant Orders   Cologuard     Encounter for immunization       Relevant Orders   Flu vaccine HIGH DOSE PF(Fluzone Trivalent) (Completed)     Need for Streptococcus pneumoniae vaccination       Relevant Orders   Pneumococcal conjugate vaccine 20-valent (Prevnar 20) (Completed)        Medications Ordered Meds ordered this encounter  Medications   triamcinolone  acetonide (KENALOG -40)  injection 40 mg   lidocaine  (PF) (XYLOCAINE ) 1 % injection 2 mL   Other Orders Orders Placed This Encounter  Procedures   Flu vaccine HIGH DOSE PF(Fluzone Trivalent)   Pneumococcal conjugate vaccine 20-valent (Prevnar 20)   Cologuard   Follow Up: Return in about 2 months (around 02/26/2024) for HTN.  PROCEDURE NOTE  PROCEDURE: right shoulder joint steroid injection.  PREOPERATIVE DIAGNOSIS: Bursitis of the right shoulder.  POSTOPERATIVE DIAGNOSIS: Bursitis of the right shoulder.  PROCEDURE: The patient was apprised of the risks and the benefits of the procedure and informed consent was obtained, as witnessed by [facility]. Time-out procedure was performed, with confirmation of  the patient's name, date of birth, and correct identification of the right shoulder to be injected. The patient's shoulder was then marked at the appropriate site for injection placement. The shoulder was sterilely prepped with Betadine. A 40 mg (1 milliliter) solution of Kenalog  was drawn up into a 3 mL syringe with a 2 mL of 1% lidocaine . The patient was injected with a 25 gauge needle at the lateral  aspect of his  right shoulder. There were no complications. The patient tolerated the procedure well. There was minimal bleeding. The patient was instructed to ice her shoulder upon leaving clinic and refrain from overuse over the next 3 days. The patient was instructed to go to the emergency room with any usual pain, swelling, or redness occurred in the injected area. The patient was given a followup appointment to evaluate response to the injection to his increased range of motion and reduction of pain.

## 2023-12-27 NOTE — Assessment & Plan Note (Signed)
 R shoulder injection preformed today

## 2023-12-27 NOTE — Assessment & Plan Note (Signed)
 BP elevated, pharm records show recent fills but overall suboptimal for the year.  Stressed importance of med adherence, patient will monitor home BP readings and bring in records, also bring all meds to next vsiit.

## 2023-12-27 NOTE — Assessment & Plan Note (Signed)
Continue coreg and xarelto

## 2023-12-27 NOTE — Assessment & Plan Note (Signed)
 Continue crestor

## 2023-12-27 NOTE — Patient Instructions (Signed)
 VISIT SUMMARY: Today, you were seen for right shoulder pain that has been persistent for the past week and a half. You also discussed your chronic conditions, including heart failure, atrial fibrillation, hypertension, and hyperlipidemia. We reviewed your medication adherence and general health maintenance needs. YOUR PLAN: -IMPINGEMENT SYNDROME OF RIGHT SHOULDER: Impingement syndrome occurs when the shoulder tendons are pinched during movement, causing pain. You will receive a steroid injection in your right shoulder to help relieve the pain. -CHRONIC SYSTOLIC HEART FAILURE: Chronic systolic heart failure is a condition where the heart doesn't pump blood as well as it should. You are doing well with your medications, and it's important to continue taking them as prescribed. -PAROXYSMAL ATRIAL FIBRILLATION: Paroxysmal atrial fibrillation is an irregular and often rapid heart rate that can lead to poor blood flow. You are managing this condition well with your current medications. -ESSENTIAL HYPERTENSION: Essential hypertension is high blood pressure with no identifiable cause. You are maintaining good control with your medications. -HYPERLIPIDEMIA: Hyperlipidemia is having high levels of fats (lipids) in your blood. You are managing this condition well with your medications. -GENERAL HEALTH MAINTENANCE: You are due for colon cancer screening and vaccinations. We will send you a Cologuard kit for colon cancer screening, which is valid for three years. You will also receive a flu shot and a pneumonia shot. INSTRUCTIONS: Please follow up as needed for your shoulder pain and continue taking your medications as prescribed. Use the Cologuard kit for colon cancer screening and return it as instructed. You will receive your flu and pneumonia shots today.

## 2024-04-21 ENCOUNTER — Ambulatory Visit: Payer: Self-pay

## 2024-04-27 ENCOUNTER — Ambulatory Visit: Admitting: Student

## 2024-04-27 NOTE — Progress Notes (Unsigned)
" ° °  Established Patient Office Visit  Subjective   Patient ID: Antonio Ramos, male    DOB: 31-Aug-1960  Age: 64 y.o. MRN: [id number]  No chief complaint on file.   [name] is a 64 y.o. male HTN, paroxysmal A fib, R shoulder impingement syndrome presents today for right shoulder injection.  Last office visit was 12/27/2023 where patient received intra-articular Kenalog  injection.  Review of Systems:  As per assessment and Plan   Objective:     There were no vitals filed for this visit. ***  Physical Exam General: Sitting in chair, no acute distress Cardiovascular: Regular rate Pulmonary: Breathing comfortably Abdomen: Soft, nontender, nondistended MSK: No lower extremity edema bilaterally  {Labs (Optional):23779}  The ASCVD Risk score (Arnett DK, et al., 2019) failed to calculate for the following reasons:   Risk score cannot be calculated because patient has a medical history suggesting prior/existing ASCVD   * - Cholesterol units were assumed    Assessment & Plan:   Patient {GC/GE:[id number]::discussed with,seen with} Dr. {NAMES:[id number]::Chambliss,Chun,Hoffman,Lau,Machen,Narendra,Williams,Winfrey}  Right shoulder impingement Last shoulder injection was on 12/27/2023, who presents today for another injection.  Problem List Items Addressed This Visit   None   No follow-ups on file.    Toma Edwards, DO "

## 2024-05-04 ENCOUNTER — Ambulatory Visit

## 2024-05-04 VITALS — BP 164/97 | HR 92 | Temp 98.4°F | Ht 74.0 in | Wt 220.4 lb

## 2024-05-04 DIAGNOSIS — I10 Essential (primary) hypertension: Secondary | ICD-10-CM

## 2024-05-04 DIAGNOSIS — M7541 Impingement syndrome of right shoulder: Secondary | ICD-10-CM

## 2024-05-04 MED ORDER — LIDOCAINE HCL (PF) 1 % IJ SOLN
3.0000 mL | Freq: Once | INTRAMUSCULAR | Status: AC
  Administered 2024-05-04: 3 mL

## 2024-05-04 MED ORDER — TRIAMCINOLONE ACETONIDE 40 MG/ML IJ SUSP
40.0000 mg | Freq: Once | INTRAMUSCULAR | Status: AC
  Administered 2024-05-04: 40 mg via INTRA_ARTICULAR

## 2024-05-04 NOTE — Assessment & Plan Note (Addendum)
 Patient tolerated the procedure well. Orders:   lidocaine  (PF) (XYLOCAINE ) 1 % injection 3 mL   triamcinolone  acetonide (KENALOG -40) injection 40 mg

## 2024-05-04 NOTE — Progress Notes (Signed)
 "  Established Patient Office Visit  Subjective   Patient ID: Antonio Ramos, male    DOB: April 04, 1961  Age: 64 y.o. MRN: [id number]  HPI  Here for shoulder injection for right shoulder bursitis following an injury at work. Last shoulder injection was in 12/2023. Today he feels well and has no complaints.   Past Medical History:  Diagnosis Date   Atrial fibrillation (HCC) 07/2021   Blood transfusion without reported diagnosis    Hemorrhagic shock (HCC) 11/23/2012   HFrEF (heart failure with reduced ejection fraction) (HCC) 07/15/2021   (a) 07/2021 LVEF 45%   Hyperlipidemia    Hypertension    MI (myocardial infarction) (HCC)    per patient report, unconfimred   PUD (peptic ulcer disease)    Diagnosed in 1986, with 3 prior bleeds since that time, most recently in 2004   Tobacco use    Past Surgical History:  Procedure Laterality Date   ESOPHAGOGASTRODUODENOSCOPY N/A 11/23/2012   Procedure: ESOPHAGOGASTRODUODENOSCOPY (EGD);  Surgeon: Oliva FORBES Boots, MD;  Location: [facility] ENDOSCOPY;  Service: Endoscopy;  Laterality: N/A;   UPPER GI ENDOSCOPY          Objective:     BP (!) 164/97 (BP Location: Left Arm, Patient Position: Sitting, Cuff Size: Large)   Pulse 92   Temp 98.4 F (36.9 C) (Oral)   Ht 6' 2 (1.88 m)   Wt 220 lb 6.4 oz (100 kg)   SpO2 98%   BMI 28.30 kg/m  BP Readings from Last 3 Encounters:  05/04/24 (!) 164/97  12/27/23 (!) 154/103  08/26/23 (!) 148/93   Wt Readings from Last 3 Encounters:  05/04/24 220 lb 6.4 oz (100 kg)  12/27/23 211 lb 12.8 oz (96.1 kg)  08/26/23 208 lb 3.2 oz (94.4 kg)      Physical Exam Vitals reviewed.  Constitutional:      Appearance: Normal appearance.  Eyes:     Conjunctiva/sclera: Conjunctivae normal.  Pulmonary:     Effort: Pulmonary effort is normal.  Musculoskeletal:        General: Normal range of motion.  Skin:    General: Skin is warm.  Neurological:     General: No focal deficit present.     Mental Status: He is alert  and oriented to person, place, and time.  Psychiatric:        Mood and Affect: Mood normal.        Behavior: Behavior normal.      No results found for any visits on 05/04/24.     The ASCVD Risk score (Arnett DK, et al., 2019) failed to calculate for the following reasons:   Risk score cannot be calculated because patient has a medical history suggesting prior/existing ASCVD   * - Cholesterol units were assumed    Assessment & Plan:   Assessment & Plan Essential hypertension Medication adherence is a reoccurring problem for patient.  He reports taking his blood pressure medicine every day at night although this is not supported by his elevated blood pressure today or his dispense history.  I encouraged him at length to please take his blood pressure medicine, he reassured me that he will continue to take his blood pressure.  He says he had a blood transfusion and his blood pressure will always be high even though he takes his blood pressure medicine.  I asked when he would like to schedule a follow-up appointment to discuss his health aside from shoulder injections, he will let us  know. Plan Continue  current regimen of amlodipine  5 mg daily and losartan  50 mg twice daily    Shoulder impingement syndrome, right Patient tolerated the procedure well. Orders:   lidocaine  (PF) (XYLOCAINE ) 1 % injection 3 mL   triamcinolone  acetonide (KENALOG -40) injection 40 mg  PROCEDURE NOTE  PROCEDURE: right shoulder joint steroid injection.  PREOPERATIVE DIAGNOSIS: Bursitis of the right shoulder.  POSTOPERATIVE DIAGNOSIS: Bursitis of the right shoulder.  PROCEDURE: The patient was apprised of the risks and the benefits of the procedure and informed consent was obtained, as witnessed by Dr. Rosan. Time-out procedure was performed, with confirmation of the patient's name, date of birth, and correct identification of the right shoulder to be injected. The patient's shoulder was then marked at  the appropriate site for injection placement. The shoulder was sterilely prepped with Betadine. A 40 mg, 1 mL, solution of Kenalog  was drawn up into a 3 mL syringe with a 2 mL of 1% lidocaine . The patient was injected with a 25 gauge needle at the lateral  aspect of his  right shoulder. There were no complications. The patient tolerated the procedure well. There was minimal bleeding. The patient was instructed to ice her shoulder upon leaving clinic and refrain from overuse over the next 3 days. The patient was instructed to go to the emergency room with any usual pain, swelling, or redness occurred in the injected area. The patient was given a followup appointment to evaluate response to the injection to his increased range of motion and reduction of pain.  The procedure was supervised by attending physician, Dr. Rosan.  Return if symptoms worsen or fail to improve.    Viktoria King, DO  "

## 2024-05-04 NOTE — Patient Instructions (Addendum)
 It was wonderful seeing you today!   1) We administered your steroid injection today. You may shower and bath as normal, washing the area with regular soap and water . A little bleeding on the band aid is normal today but should stop by tomorrow. Call our clinic if you have continued bleeding.   2) Make sure to take both of your blood pressure medicines!!   If you have any questions please feel free to the call the clinic at anytime at [phone].  Have a blessed day,  Dr. Charmayne

## 2024-05-04 NOTE — Assessment & Plan Note (Signed)
 Medication adherence is a reoccurring problem for patient.  He reports taking his blood pressure medicine every day at night although this is not supported by his elevated blood pressure today or his dispense history.  I encouraged him at length to please take his blood pressure medicine, he reassured me that he will continue to take his blood pressure.  He says he had a blood transfusion and his blood pressure will always be high even though he takes his blood pressure medicine.  I asked when he would like to schedule a follow-up appointment to discuss his health aside from shoulder injections, he will let us  know. Plan Continue current regimen of amlodipine  5 mg daily and losartan  50 mg twice daily

## 2024-05-05 ENCOUNTER — Other Ambulatory Visit: Payer: Self-pay

## 2024-05-06 NOTE — Progress Notes (Signed)
 Internal Medicine Clinic Attending  I was physically present during the key portions of the resident provided service and participated in the medical decision making of patient's management care. I reviewed pertinent patient test results.  The assessment, diagnosis, and plan were formulated together and I agree with the documentation in the resident's note.  I was present for the entire procedure.  Gust Rung, DO

## 2024-05-10 ENCOUNTER — Other Ambulatory Visit (HOSPITAL_COMMUNITY): Payer: Self-pay
